# Patient Record
Sex: Female | Born: 1945 | Race: Black or African American | Hispanic: No | Smoking: Former smoker
Health system: Southern US, Community
[De-identification: ages and names within clinical notes are randomized; demographics above are authoritative.]

## PROBLEM LIST (undated history)

## (undated) DIAGNOSIS — Z8673 Personal history of transient ischemic attack (TIA), and cerebral infarction without residual deficits: Secondary | ICD-10-CM

## (undated) DIAGNOSIS — E785 Hyperlipidemia, unspecified: Secondary | ICD-10-CM

## (undated) DIAGNOSIS — I639 Cerebral infarction, unspecified: Secondary | ICD-10-CM

## (undated) DIAGNOSIS — I10 Essential (primary) hypertension: Secondary | ICD-10-CM

## (undated) HISTORY — PX: NO PAST SURGERIES: SHX2092

## (undated) HISTORY — DX: Hyperlipidemia, unspecified: E78.5

---

## 2003-07-25 ENCOUNTER — Inpatient Hospital Stay (HOSPITAL_COMMUNITY)
Admission: RE | Admit: 2003-07-25 | Discharge: 2003-08-08 | Payer: Self-pay | Admitting: Physical Medicine & Rehabilitation

## 2003-09-04 ENCOUNTER — Encounter
Admission: RE | Admit: 2003-09-04 | Discharge: 2003-12-03 | Payer: Self-pay | Admitting: Physical Medicine & Rehabilitation

## 2006-08-10 ENCOUNTER — Emergency Department (HOSPITAL_COMMUNITY): Admission: EM | Admit: 2006-08-10 | Discharge: 2006-08-10 | Payer: Self-pay | Admitting: Emergency Medicine

## 2008-02-16 ENCOUNTER — Emergency Department (HOSPITAL_COMMUNITY): Admission: EM | Admit: 2008-02-16 | Discharge: 2008-02-16 | Payer: Self-pay | Admitting: Emergency Medicine

## 2008-10-01 ENCOUNTER — Encounter: Admission: RE | Admit: 2008-10-01 | Discharge: 2008-10-01 | Payer: Self-pay | Admitting: Internal Medicine

## 2009-01-31 ENCOUNTER — Encounter (INDEPENDENT_AMBULATORY_CARE_PROVIDER_SITE_OTHER): Payer: Self-pay | Admitting: Obstetrics and Gynecology

## 2009-01-31 ENCOUNTER — Ambulatory Visit (HOSPITAL_COMMUNITY): Admission: RE | Admit: 2009-01-31 | Discharge: 2009-01-31 | Payer: Self-pay | Admitting: Obstetrics and Gynecology

## 2009-02-18 ENCOUNTER — Inpatient Hospital Stay (HOSPITAL_COMMUNITY): Admission: EM | Admit: 2009-02-18 | Discharge: 2009-03-19 | Payer: Self-pay | Admitting: Emergency Medicine

## 2009-03-12 ENCOUNTER — Ambulatory Visit: Payer: Self-pay | Admitting: Physical Medicine & Rehabilitation

## 2009-03-19 ENCOUNTER — Inpatient Hospital Stay (HOSPITAL_COMMUNITY)
Admission: RE | Admit: 2009-03-19 | Discharge: 2009-04-02 | Payer: Self-pay | Admitting: Physical Medicine & Rehabilitation

## 2009-03-19 ENCOUNTER — Ambulatory Visit: Payer: Self-pay | Admitting: Physical Medicine & Rehabilitation

## 2009-05-01 ENCOUNTER — Encounter
Admission: RE | Admit: 2009-05-01 | Discharge: 2009-05-01 | Payer: Self-pay | Admitting: Physical Medicine & Rehabilitation

## 2009-05-02 ENCOUNTER — Encounter: Admission: RE | Admit: 2009-05-02 | Discharge: 2009-07-31 | Payer: Self-pay | Admitting: Neurosurgery

## 2009-05-04 ENCOUNTER — Emergency Department (HOSPITAL_COMMUNITY): Admission: EM | Admit: 2009-05-04 | Discharge: 2009-05-04 | Payer: Self-pay | Admitting: Emergency Medicine

## 2009-05-16 ENCOUNTER — Inpatient Hospital Stay (HOSPITAL_COMMUNITY): Admission: EM | Admit: 2009-05-16 | Discharge: 2009-05-17 | Payer: Self-pay | Admitting: Emergency Medicine

## 2009-05-27 ENCOUNTER — Encounter: Admission: RE | Admit: 2009-05-27 | Discharge: 2009-05-27 | Payer: Self-pay | Admitting: Neurosurgery

## 2009-06-04 ENCOUNTER — Encounter
Admission: RE | Admit: 2009-06-04 | Discharge: 2009-06-04 | Payer: Self-pay | Admitting: Physical Medicine & Rehabilitation

## 2009-07-24 ENCOUNTER — Inpatient Hospital Stay (HOSPITAL_COMMUNITY): Admission: EM | Admit: 2009-07-24 | Discharge: 2009-07-26 | Payer: Self-pay | Admitting: Emergency Medicine

## 2009-07-24 ENCOUNTER — Ambulatory Visit: Payer: Self-pay | Admitting: Internal Medicine

## 2009-07-25 ENCOUNTER — Encounter (INDEPENDENT_AMBULATORY_CARE_PROVIDER_SITE_OTHER): Payer: Self-pay | Admitting: Emergency Medicine

## 2010-12-23 ENCOUNTER — Inpatient Hospital Stay (HOSPITAL_COMMUNITY)
Admission: EM | Admit: 2010-12-23 | Discharge: 2010-12-26 | DRG: 065 | Disposition: A | Discharge status: Home Health | Payer: MEDICARE | Attending: Internal Medicine | Admitting: Internal Medicine

## 2010-12-23 DIAGNOSIS — Z7902 Long term (current) use of antithrombotics/antiplatelets: Secondary | ICD-10-CM

## 2010-12-23 DIAGNOSIS — F17200 Nicotine dependence, unspecified, uncomplicated: Secondary | ICD-10-CM | POA: Diagnosis present

## 2010-12-23 DIAGNOSIS — A5900 Urogenital trichomoniasis, unspecified: Secondary | ICD-10-CM | POA: Diagnosis present

## 2010-12-23 DIAGNOSIS — I10 Essential (primary) hypertension: Secondary | ICD-10-CM | POA: Diagnosis present

## 2010-12-23 DIAGNOSIS — I679 Cerebrovascular disease, unspecified: Secondary | ICD-10-CM | POA: Diagnosis present

## 2010-12-23 DIAGNOSIS — R471 Dysarthria and anarthria: Secondary | ICD-10-CM | POA: Diagnosis present

## 2010-12-23 DIAGNOSIS — N179 Acute kidney failure, unspecified: Secondary | ICD-10-CM | POA: Diagnosis present

## 2010-12-23 DIAGNOSIS — R7402 Elevation of levels of lactic acid dehydrogenase (LDH): Secondary | ICD-10-CM | POA: Diagnosis present

## 2010-12-23 DIAGNOSIS — E876 Hypokalemia: Secondary | ICD-10-CM | POA: Diagnosis present

## 2010-12-23 DIAGNOSIS — E860 Dehydration: Secondary | ICD-10-CM | POA: Diagnosis present

## 2010-12-23 DIAGNOSIS — I6350 Cerebral infarction due to unspecified occlusion or stenosis of unspecified cerebral artery: Principal | ICD-10-CM | POA: Diagnosis present

## 2010-12-23 DIAGNOSIS — G319 Degenerative disease of nervous system, unspecified: Secondary | ICD-10-CM | POA: Diagnosis present

## 2010-12-23 DIAGNOSIS — Z880 Allergy status to penicillin: Secondary | ICD-10-CM

## 2010-12-23 DIAGNOSIS — K219 Gastro-esophageal reflux disease without esophagitis: Secondary | ICD-10-CM | POA: Diagnosis present

## 2010-12-23 DIAGNOSIS — R7401 Elevation of levels of liver transaminase levels: Secondary | ICD-10-CM | POA: Diagnosis present

## 2010-12-23 DIAGNOSIS — E669 Obesity, unspecified: Secondary | ICD-10-CM | POA: Diagnosis present

## 2010-12-23 DIAGNOSIS — Z7982 Long term (current) use of aspirin: Secondary | ICD-10-CM

## 2010-12-23 DIAGNOSIS — E119 Type 2 diabetes mellitus without complications: Secondary | ICD-10-CM | POA: Diagnosis present

## 2010-12-23 DIAGNOSIS — Z87820 Personal history of traumatic brain injury: Secondary | ICD-10-CM

## 2010-12-23 DIAGNOSIS — F079 Unspecified personality and behavioral disorder due to known physiological condition: Secondary | ICD-10-CM | POA: Diagnosis present

## 2010-12-23 LAB — CBC
MCH: 28 pg (ref 26.0–34.0)
MCHC: 33.3 g/dL (ref 30.0–36.0)
RDW: 14.5 % (ref 11.5–15.5)

## 2010-12-23 LAB — DIFFERENTIAL
Basophils Absolute: 0 10*3/uL (ref 0.0–0.1)
Basophils Relative: 0 % (ref 0–1)
Eosinophils Absolute: 0.1 10*3/uL (ref 0.0–0.7)
Eosinophils Relative: 1 % (ref 0–5)
Lymphocytes Relative: 9 % — ABNORMAL LOW (ref 12–46)
Monocytes Relative: 5 % (ref 3–12)
Neutrophils Relative %: 85 % — ABNORMAL HIGH (ref 43–77)

## 2010-12-23 LAB — COMPREHENSIVE METABOLIC PANEL
ALT: 124 U/L — ABNORMAL HIGH (ref 0–35)
Albumin: 2.7 g/dL — ABNORMAL LOW (ref 3.5–5.2)
BUN: 17 mg/dL (ref 6–23)
Calcium: 8.8 mg/dL (ref 8.4–10.5)
Glucose, Bld: 286 mg/dL — ABNORMAL HIGH (ref 70–99)
Total Bilirubin: 2.1 mg/dL — ABNORMAL HIGH (ref 0.3–1.2)
Total Protein: 6.4 g/dL (ref 6.0–8.3)

## 2010-12-24 ENCOUNTER — Emergency Department (HOSPITAL_COMMUNITY): Payer: MEDICARE

## 2010-12-24 DIAGNOSIS — R55 Syncope and collapse: Secondary | ICD-10-CM

## 2010-12-24 DIAGNOSIS — M79609 Pain in unspecified limb: Secondary | ICD-10-CM

## 2010-12-24 LAB — URINE MICROSCOPIC-ADD ON

## 2010-12-24 LAB — CBC
HCT: 35.8 % — ABNORMAL LOW (ref 36.0–46.0)
MCHC: 33 g/dL (ref 30.0–36.0)
MCV: 82.7 fL (ref 78.0–100.0)
Platelets: 158 10*3/uL (ref 150–400)
RDW: 14.4 % (ref 11.5–15.5)
WBC: 7.7 10*3/uL (ref 4.0–10.5)

## 2010-12-24 LAB — DIFFERENTIAL
Eosinophils Absolute: 0.1 10*3/uL (ref 0.0–0.7)
Eosinophils Relative: 2 % (ref 0–5)
Lymphocytes Relative: 12 % (ref 12–46)
Lymphs Abs: 0.9 10*3/uL (ref 0.7–4.0)
Monocytes Absolute: 0.6 10*3/uL (ref 0.1–1.0)

## 2010-12-24 LAB — GLUCOSE, CAPILLARY
Glucose-Capillary: 205 mg/dL — ABNORMAL HIGH (ref 70–99)
Glucose-Capillary: 238 mg/dL — ABNORMAL HIGH (ref 70–99)

## 2010-12-24 LAB — HEMOGLOBIN A1C
Hgb A1c MFr Bld: 7 % — ABNORMAL HIGH (ref <5.7)
Mean Plasma Glucose: 154 mg/dL — ABNORMAL HIGH (ref <117)

## 2010-12-24 LAB — URINALYSIS, ROUTINE W REFLEX MICROSCOPIC
Hgb urine dipstick: NEGATIVE
Nitrite: POSITIVE — AB
Specific Gravity, Urine: 1.029 (ref 1.005–1.030)
Urobilinogen, UA: 4 mg/dL — ABNORMAL HIGH (ref 0.0–1.0)
pH: 6 (ref 5.0–8.0)

## 2010-12-24 LAB — COMPREHENSIVE METABOLIC PANEL
Albumin: 2.5 g/dL — ABNORMAL LOW (ref 3.5–5.2)
BUN: 18 mg/dL (ref 6–23)
Calcium: 8.7 mg/dL (ref 8.4–10.5)
Creatinine, Ser: 1.72 mg/dL — ABNORMAL HIGH (ref 0.4–1.2)
Glucose, Bld: 263 mg/dL — ABNORMAL HIGH (ref 70–99)
Potassium: 3.8 mEq/L (ref 3.5–5.1)
Total Protein: 5.7 g/dL — ABNORMAL LOW (ref 6.0–8.3)

## 2010-12-24 LAB — T3: T3, Total: 69.4 ng/dl — ABNORMAL LOW (ref 80.0–204.0)

## 2010-12-24 LAB — TROPONIN I
Troponin I: 0.18 ng/mL — ABNORMAL HIGH (ref 0.00–0.06)
Troponin I: 0.18 ng/mL — ABNORMAL HIGH (ref 0.00–0.06)

## 2010-12-24 LAB — CK TOTAL AND CKMB (NOT AT ARMC)
CK, MB: 2.1 ng/mL (ref 0.3–4.0)
Relative Index: 0.5 (ref 0.0–2.5)
Total CK: 438 U/L — ABNORMAL HIGH (ref 7–177)

## 2010-12-24 LAB — LIPID PANEL
HDL: 26 mg/dL — ABNORMAL LOW (ref >39)
LDL Cholesterol: 86 mg/dL (ref 0–99)
Total CHOL/HDL Ratio: 6.2 RATIO
Triglycerides: 249 mg/dL — ABNORMAL HIGH (ref <150)
VLDL: 50 mg/dL — ABNORMAL HIGH (ref 0–40)

## 2010-12-24 LAB — CARDIAC PANEL(CRET KIN+CKTOT+MB+TROPI)
CK, MB: 2.1 ng/mL (ref 0.3–4.0)
Total CK: 244 U/L — ABNORMAL HIGH (ref 7–177)

## 2010-12-25 ENCOUNTER — Inpatient Hospital Stay (HOSPITAL_COMMUNITY): Payer: MEDICARE

## 2010-12-25 LAB — DIFFERENTIAL
Eosinophils Absolute: 0.3 10*3/uL (ref 0.0–0.7)
Lymphs Abs: 1.9 10*3/uL (ref 0.7–4.0)
Monocytes Absolute: 0.6 10*3/uL (ref 0.1–1.0)
Monocytes Relative: 9 % (ref 3–12)
Neutro Abs: 4 10*3/uL (ref 1.7–7.7)
Neutrophils Relative %: 59 % (ref 43–77)

## 2010-12-25 LAB — URINE CULTURE

## 2010-12-25 LAB — COMPREHENSIVE METABOLIC PANEL
BUN: 19 mg/dL (ref 6–23)
CO2: 22 mEq/L (ref 19–32)
Chloride: 108 mEq/L (ref 96–112)
Creatinine, Ser: 1.57 mg/dL — ABNORMAL HIGH (ref 0.4–1.2)
GFR calc non Af Amer: 33 mL/min — ABNORMAL LOW (ref >60)
Total Bilirubin: 0.3 mg/dL (ref 0.3–1.2)

## 2010-12-25 LAB — HEPATITIS PANEL, ACUTE
HCV Ab: NEGATIVE
Hep A IgM: NEGATIVE
Hep B C IgM: NEGATIVE
Hepatitis B Surface Ag: NEGATIVE

## 2010-12-25 LAB — CBC
Hemoglobin: 10.8 g/dL — ABNORMAL LOW (ref 12.0–15.0)
MCH: 26.6 pg (ref 26.0–34.0)
MCHC: 31.7 g/dL (ref 30.0–36.0)
MCV: 84 fL (ref 78.0–100.0)
RBC: 4.06 MIL/uL (ref 3.87–5.11)

## 2010-12-25 LAB — GLUCOSE, CAPILLARY
Glucose-Capillary: 210 mg/dL — ABNORMAL HIGH (ref 70–99)
Glucose-Capillary: 163 mg/dL — ABNORMAL HIGH (ref 70–99)

## 2010-12-26 ENCOUNTER — Inpatient Hospital Stay (HOSPITAL_COMMUNITY): Payer: MEDICARE

## 2010-12-26 LAB — BASIC METABOLIC PANEL
BUN: 16 mg/dL (ref 6–23)
CO2: 23 mEq/L (ref 19–32)
Calcium: 8.1 mg/dL — ABNORMAL LOW (ref 8.4–10.5)
Creatinine, Ser: 1.3 mg/dL — ABNORMAL HIGH (ref 0.4–1.2)
GFR calc Af Amer: 50 mL/min — ABNORMAL LOW (ref >60)

## 2010-12-26 LAB — GLUCOSE, CAPILLARY
Glucose-Capillary: 163 mg/dL — ABNORMAL HIGH (ref 70–99)
Glucose-Capillary: 253 mg/dL — ABNORMAL HIGH (ref 70–99)

## 2010-12-26 NOTE — H&P (Signed)
[name], [name]           ACCOUNT NO.:  [id number]  MEDICAL RECORD NO.:  [id number]           PATIENT TYPE:  E  LOCATION:  [facility]                         FACILITY:  [facility]  PHYSICIAN:  Talmage Nap, MD  DATE OF BIRTH:  18-Dec-1945  DATE OF ADMISSION:  12/23/2010 DATE OF DISCHARGE:                             HISTORY & PHYSICAL   History obtainable from the patient (not a very good historian).  CHIEF COMPLAINT:  "Passed out about 3 times".  HISTORY OF PRESENT ILLNESS:  The patient is a 65 year old African American female, obese with prior history of CVA, diabetic, not a very good historian, presenting to the emergency room with history of having passed out on 3 occasions prior to this admission.  She denied any premonitory symptoms prior to personal.  She denied any history of chest pain.  No shortness of breath.  He denied any history of fever.  No chills.  No rigor.  The patient could not specify for how long she passed out.  She also denied any history of weakness or slurred speech. Because the patient has passed out on 3 occasions, she was subsequently brought to the emergency room by her daughter for evaluation.  Past medical history is positive for diabetes mellitus, dementia, cognitive impairment, history of subdural hematoma, multiple CVA with bilateral hemiparesis, hypertension and GERD.  PAST SURGICAL HISTORY:  The patient cannot recall any surgeries.  MEDICATIONS: Her preadmission meds unknown.  ALLERGIES:  She has allergies to PENICILLIN.  SOCIAL HISTORY:  The patient smokes about a pack of cigarettes per day for over 20 years.  Denies any history of alcohol or street drug use and she lives with her daughter.  FAMILY HISTORY:  York Spaniel to be positive for hypertension.  REVIEW OF SYSTEMS:  The patient denies any history of headaches.  No blurred vision, nausea, vomiting.  No fever. No chills.  No rigor.  No chest pain or shortness of breath.  Occasional  cough, abdominal discomfort.  No diarrhea or hematochezia.  No dysuria or hematuria.  No swelling of the lower extremities.  No intolerance to heat or cold and no known psychiatric disorder.  PHYSICAL EXAMINATION:  GENERAL:  On examination, elderly lady not in any obvious respiratory distress with hirsutism, suboptimal hydration. VITAL SIGNS:  Blood pressure is 93/43, pulse 96, respiratory rate is 20, temperature is 99.5. HEENT:  Pupils are reactive to light and extraocular muscles are intact. NECK:  No jugular venous distention.  No carotid bruit.  No lymphadenopathy. CHEST:  Showed minimal scattered rhonchi. HEART:  Heart sounds are 1 and 2. ABDOMEN:  Obese, nontender.  Liver, spleen could not palpable.  Bowel sounds are positive. EXTREMITIES:  No pedal edema. NEUROLOGIC:  Did not show any lateralizing signs. NEUROPSYCHIATRIC:  Evaluation showed the patient to be slightly forgetful. MUSCULOSKELETAL:  Unremarkable. SKIN:  Showed decreased turgor.  LABORATORY DATA:  Initial complete blood count with differential showed WBC of 9.3, hemoglobin of 13.0, hematocrit of 39.0, MCV of 83.9 with a platelet count of 156, neutrophil of 85%.  Comprehensive metabolic panel showed sodium of 136, potassium of 4.3, chloride of 102 with a bicarb of 24,  glucose is 286, BUN is 17, creatinine is 1.63.  LFTs showed total bilirubin 2.1, alkaline phosphatase is 137, AST 120, ALT 124, total protein is 6.4 and albumin is 2.7.  Urinalysis showed positive nitrite with moderate leukocyte esterase.  Urine microscopy showed wbc's 21-50, bacteria many with Trichomonas present and a repeat complete blood count with differential done on December 24, 2010 showed WBC of 7.7, hemoglobin of 11.8, hematocrit of 35.8, MCV 80.7, and platelet count of 158. Comprehensive metabolic panel shows sodium of 141, potassium of 3.8, chloride 106 with a bicarb of 25, glucose is 263, BUN is 18, creatinine is 1.72.  LFT total  bilirubin is 1.3, alkaline phosphatase 129, AST 95, ALT 115.  First set of cardiac enzyme troponin-I 0.18.  Imaging studies done on the patient include CT of the head without contrast which showed old left frontal and left basilar ganglia impacted atrophy and chronic ischemic white matter changes.  EKG showed a normal sinus rhythm, LVH with a rate of 88.  No acute ST-wave change noted.  IMPRESSION: 1. Syncope. 2. Obesity. 3. Diabetes mellitus. 4. Prior history of cerebrovascular accident. 5. History of subdural hematoma. 6. Urinary tract infection (incidentaloma/trichomoniasis). 7. History of hypertension (BP now borderline). 8. Cognitive impairment. 9. Chronic obstructive pulmonary disease. 10.Coronary insufficiency. 11.Abnormal LFT questionable etiology.  PLAN:  Plan is to admit the patient to telemetry.  The patient will be given aspirin 81 mg p.o. daily.  She will be adequately rehydrated with half-normal saline IV to go at rate of 6 mL an hour and Lipitor 20 mg p.o. daily.  She will placed on Accu-Cheks t.i.d. with a.c. and bedtime with regular insulin sliding scale (moderate scale).  She will be treated for UTI with Avelox 100 mg p.o. daily and trichomoniases with Flagyl 500 mg p.o. t.i.d.  She also be on Combivent inhaler 2 puffs q.i.d. p.r.n. for questionable COPD.  She will be given Protonix 40 mg p.o. daily for GI prophylaxis and Lovenox 40 mg subcu q. 24 for DVT prophylaxis.  Further lab work to be ordered on this patient will include cardiac enzymes q. 6h x3, a 2-D echo, carotid duplex, hepatitis panel A, B and C, thyroid panel which include TSH, T3 and T4, hemoglobin A1c and lipid panel.  The patient will also have EEG done to rule out seizure.  She will be followed and evaluated on a daily basis.     Talmage Nap, MD     CN/MEDQ  D:  12/24/2010  T:  12/24/2010  Job:  [id number]  Electronically Signed by Talmage Nap  on 12/24/2010 06:09:53 AM

## 2011-01-05 NOTE — Discharge Summary (Signed)
NAMESECILIA, APPS           ACCOUNT NO.:  [id number]  MEDICAL RECORD NO.:  [id number]           PATIENT TYPE:  I  LOCATION:  2501                         FACILITY:  [facility]  PHYSICIAN:  Lonia Blood, M.D.       DATE OF BIRTH:  August 06, 1946  DATE OF ADMISSION:  12/23/2010 DATE OF DISCHARGE:  12/26/2010                              DISCHARGE SUMMARY   PRIMARY CARE PHYSICIAN:  Fleet Contras, MD  DISCHARGE DIAGNOSES: 1. Delirium, resolved. 2. Acute renal failure, resolved. 3. Elevated transaminases with negative for acute hepatitis panel -     needs outpatient followup, etiology could be dehydration, but it     also could be a primary liver disease, complete workup has not been     done in the hospital. 4. Diabetes mellitus type 2. 5. Subacute cerebellar stroke, probably the source of the vertigo and     nausea that precipitated poor p.o. intake and dehydration and acute     renal failure. 6. History of traumatic brain injury with subdural hematomas. 7. 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 8. 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 10.Hypertension. 11.Gastroesophageal reflux disease. 12.Urinary tract infection. 13.Tobacco abuse.  DISCHARGE MEDICATIONS: 1. Aspirin 81 mg daily. 2. Ceftin 250 mg twice a day. 3. Metformin 500 mg twice a day. 4. Nicotine patch 21 mg daily. 5. Omeprazole 20 mg daily. 6. Plavix 75 mg daily.  CONDITION ON DISCHARGE:  Ms. Wedemeyer was discharged in fair condition. Home Health Physical Therapy, Occupation Therapy, and nursing was signed.  The patient will follow up with Dr. Concepcion Elk in the office.  She was told to stop smoking cigarettes, participated in exercises and be compliant with her medications.  PROCEDURE DURING THIS ADMISSION: 1. On December 24, 2010, the patient underwent a head CT, which was     negative for acute  infarcts, old left frontal and left basal     ganglia infarcts, atrophy. 2. On December 25, 2010, EEG, which was negative for seizures. 3. On December 25, 2010, MRI of the brain, findings of a subacute     infarct in the right cerebellum, age undetermined probably within     couple of weeks, multiple old infarcts, atrophy, resolution of     previously seen subdural hematomas.  CONSULTATION DURING ADMISSION:  No consultations obtained.  HISTORY AND PHYSICAL:  Refer to the dictated H and P done by Dr. Beverly Gust.  HOSPITAL COURSE: 1. Ms. Finks is a 65 year old woman with history of strokes,     diabetes, organic brain syndrome, was brought to the emergency room     with confusion, delirium and dehydration.  She had blood cultures     and urine cultures sent out and urine culture even though showed no     growth.  The patient's symptoms were consistent of possible urinary     tract infection.  She was treated with intravenous Rocephin, which     was later switched to oral cefuroxime.  The patient received     intravenous fluids with  correction of her dehydration, also with     intravenous fluids, the patient's LFTs improved.  Initially, the     total bilirubin was 2.1, followup bilirubin was 0.3, same happened     with alkaline phosphatase, AST and ALT.  We think that the abnormal     LFTs were due to the urinary tract infection and dehydration,     hypovolemia.  The patient had acute hepatitis panel, which was     negative for hepatitis B, A, and C.  I would strongly recommend in     the outpatient setting the LFTs would be monitored to assure that     they improved to normality.  If not, the patient should be     evaluated with abdominal ultrasound and further liver serology for     autoimmune hepatitis, primary biliary cirrhosis, and     hemochromatosis. 2. Diabetes mellitus type 2.  Hemoglobin A1c was measured at 7.0.  The     patient was started on metformin twice a day and she  will need an     outpatient followup. 3. Subacute cerebellar stroke.  This is part of the syndrome of small     vessel disease due to tobacco abuse and alcohol.  The patient was     told to stop smoking.  She was placed on aspirin and Plavix.  She     was added by Physical Therapy and Outpatient Therapy and felt that     she is a candidate for home health PT, OT with 24-hour care by her     family. 4. Mildly elevated troponins without any chest pain.  This was     probably part of the same syndrome as the dehydration, abnormal     LFTs of generalized SIRS.  The patient had an echocardiogram that     showed normal ejection fraction with 55-60% left ventricular     hypertrophy and no significant valvular abnormalities.  Even though     the echocardiogram said that she could have the mitral valve as a     source for the embolus, her clinical picture was not one of an     endocarditis.  She was continuously afebrile with stable vital     signs.  She never had any chest pain, also supposedly acute stroke     we were investigating for is a cerebellar small-vessel disease     stroke, that was subacute in nature.  She has to pass a lot of     other hurdles before embarking on transesophageal echocardiogram     and potential anticoagulation.  Those hurdles will be smoking     cessation, compliance with aspirin and Plavix and another risk     factor modification like diabetes control.  The patient was     discharged home on December 26, 2010 and to follow up with her     primary care physician Dr. Concepcion Elk.  Please note that as part of     the full evaluation of this patient, she also underwent lower     extremity deep venous Dopplers, which were negative for deep venous     thrombosis and carotid ultrasound, which was negative for any     carotid artery stenosis.     Lonia Blood, M.D.     SL/MEDQ  D:  12/28/2010  T:  12/28/2010  Job:  [id number]  cc:   Fleet Contras, M.D.  Electronically  Signed by Lonia Blood  M.D. on 12/29/2010 07:01:44 PM

## 2011-02-26 LAB — COMPREHENSIVE METABOLIC PANEL
ALT: 18 U/L (ref 0–35)
AST: 21 U/L (ref 0–37)
Albumin: 3.7 g/dL (ref 3.5–5.2)
Alkaline Phosphatase: 73 U/L (ref 39–117)
Calcium: 9.2 mg/dL (ref 8.4–10.5)
GFR calc Af Amer: >60 mL/min (ref >60)
Glucose, Bld: 89 mg/dL (ref 70–99)
Potassium: 4.6 mEq/L (ref 3.5–5.1)
Sodium: 142 mEq/L (ref 135–145)
Total Protein: 6.6 g/dL (ref 6.0–8.3)

## 2011-02-26 LAB — TROPONIN I
Troponin I: 0.06 ng/mL (ref 0.00–0.06)
Troponin I: 0.07 ng/mL — ABNORMAL HIGH (ref 0.00–0.06)

## 2011-02-26 LAB — GLUCOSE, CAPILLARY
Glucose-Capillary: 109 mg/dL — ABNORMAL HIGH (ref 70–99)
Glucose-Capillary: 129 mg/dL — ABNORMAL HIGH (ref 70–99)
Glucose-Capillary: 70 mg/dL (ref 70–99)
Glucose-Capillary: 88 mg/dL (ref 70–99)

## 2011-02-26 LAB — CK TOTAL AND CKMB (NOT AT ARMC)
Relative Index: 1.3 (ref 0.0–2.5)
Total CK: 167 U/L (ref 7–177)
Total CK: 187 U/L — ABNORMAL HIGH (ref 7–177)

## 2011-02-26 LAB — CARDIAC PANEL(CRET KIN+CKTOT+MB+TROPI)
CK, MB: 2.2 ng/mL (ref 0.3–4.0)
CK, MB: 2.8 ng/mL (ref 0.3–4.0)
Relative Index: 1.4 (ref 0.0–2.5)
Total CK: 192 U/L — ABNORMAL HIGH (ref 7–177)
Total CK: 196 U/L — ABNORMAL HIGH (ref 7–177)
Troponin I: 0.08 ng/mL — ABNORMAL HIGH (ref 0.00–0.06)

## 2011-02-26 LAB — CBC
Hemoglobin: 12.9 g/dL (ref 12.0–15.0)
Hemoglobin: 13.9 g/dL (ref 12.0–15.0)
MCHC: 33.1 g/dL (ref 30.0–36.0)
MCHC: 33.6 g/dL (ref 30.0–36.0)
MCV: 83.8 fL (ref 78.0–100.0)
MCV: 84 fL (ref 78.0–100.0)
RBC: 4.57 MIL/uL (ref 3.87–5.11)
RDW: 14.7 % (ref 11.5–15.5)
RDW: 14.7 % (ref 11.5–15.5)

## 2011-02-26 LAB — URINALYSIS, ROUTINE W REFLEX MICROSCOPIC
Glucose, UA: NEGATIVE mg/dL
Hgb urine dipstick: NEGATIVE
Protein, ur: NEGATIVE mg/dL
Specific Gravity, Urine: 1.011 (ref 1.005–1.030)

## 2011-02-26 LAB — URINE CULTURE

## 2011-02-26 LAB — URINE MICROSCOPIC-ADD ON

## 2011-02-26 LAB — VITAMIN B12: Vitamin B-12: 256 pg/mL (ref 211–911)

## 2011-02-26 LAB — PROTIME-INR: INR: 1 (ref 0.00–1.49)

## 2011-02-26 LAB — POCT I-STAT, CHEM 8
BUN: 10 mg/dL (ref 6–23)
Chloride: 109 mEq/L (ref 96–112)
Creatinine, Ser: 1 mg/dL (ref 0.4–1.2)
Potassium: 4.3 mEq/L (ref 3.5–5.1)
Sodium: 139 mEq/L (ref 135–145)

## 2011-03-01 LAB — DIFFERENTIAL
Basophils Absolute: 0.1 10*3/uL (ref 0.0–0.1)
Basophils Relative: 1 % (ref 0–1)
Eosinophils Relative: 2 % (ref 0–5)
Monocytes Absolute: 0.4 10*3/uL (ref 0.1–1.0)
Neutro Abs: 4.7 10*3/uL (ref 1.7–7.7)

## 2011-03-01 LAB — CBC
HCT: 43.4 % (ref 36.0–46.0)
Hemoglobin: 14.5 g/dL (ref 12.0–15.0)
Platelets: 215 10*3/uL (ref 150–400)
RBC: 5.11 MIL/uL (ref 3.87–5.11)
WBC: 8.4 10*3/uL (ref 4.0–10.5)

## 2011-03-01 LAB — COMPREHENSIVE METABOLIC PANEL
Albumin: 4.2 g/dL (ref 3.5–5.2)
Alkaline Phosphatase: 70 U/L (ref 39–117)
BUN: 12 mg/dL (ref 6–23)
CO2: 28 mEq/L (ref 19–32)
Chloride: 100 mEq/L (ref 96–112)
GFR calc non Af Amer: 56 mL/min — ABNORMAL LOW (ref >60)
Potassium: 3.6 mEq/L (ref 3.5–5.1)
Total Bilirubin: 0.7 mg/dL (ref 0.3–1.2)

## 2011-03-01 LAB — POCT I-STAT, CHEM 8
BUN: 11 mg/dL (ref 6–23)
Chloride: 106 mEq/L (ref 96–112)
Sodium: 141 mEq/L (ref 135–145)

## 2011-03-01 LAB — GLUCOSE, CAPILLARY
Glucose-Capillary: 109 mg/dL — ABNORMAL HIGH (ref 70–99)
Glucose-Capillary: 135 mg/dL — ABNORMAL HIGH (ref 70–99)

## 2011-03-02 LAB — GLUCOSE, CAPILLARY
Glucose-Capillary: 112 mg/dL — ABNORMAL HIGH (ref 70–99)
Glucose-Capillary: 113 mg/dL — ABNORMAL HIGH (ref 70–99)
Glucose-Capillary: 116 mg/dL — ABNORMAL HIGH (ref 70–99)
Glucose-Capillary: 118 mg/dL — ABNORMAL HIGH (ref 70–99)
Glucose-Capillary: 129 mg/dL — ABNORMAL HIGH (ref 70–99)
Glucose-Capillary: 130 mg/dL — ABNORMAL HIGH (ref 70–99)
Glucose-Capillary: 130 mg/dL — ABNORMAL HIGH (ref 70–99)
Glucose-Capillary: 130 mg/dL — ABNORMAL HIGH (ref 70–99)
Glucose-Capillary: 134 mg/dL — ABNORMAL HIGH (ref 70–99)
Glucose-Capillary: 141 mg/dL — ABNORMAL HIGH (ref 70–99)
Glucose-Capillary: 145 mg/dL — ABNORMAL HIGH (ref 70–99)
Glucose-Capillary: 150 mg/dL — ABNORMAL HIGH (ref 70–99)
Glucose-Capillary: 155 mg/dL — ABNORMAL HIGH (ref 70–99)
Glucose-Capillary: 190 mg/dL — ABNORMAL HIGH (ref 70–99)
Glucose-Capillary: 49 mg/dL — ABNORMAL LOW (ref 70–99)
Glucose-Capillary: 52 mg/dL — ABNORMAL LOW (ref 70–99)
Glucose-Capillary: 57 mg/dL — ABNORMAL LOW (ref 70–99)
Glucose-Capillary: 62 mg/dL — ABNORMAL LOW (ref 70–99)
Glucose-Capillary: 64 mg/dL — ABNORMAL LOW (ref 70–99)
Glucose-Capillary: 73 mg/dL (ref 70–99)
Glucose-Capillary: 76 mg/dL (ref 70–99)
Glucose-Capillary: 77 mg/dL (ref 70–99)
Glucose-Capillary: 80 mg/dL (ref 70–99)
Glucose-Capillary: 88 mg/dL (ref 70–99)
Glucose-Capillary: 93 mg/dL (ref 70–99)
Glucose-Capillary: 94 mg/dL (ref 70–99)

## 2011-03-02 LAB — URINE CULTURE: Colony Count: 7000

## 2011-03-02 LAB — URINE MICROSCOPIC-ADD ON

## 2011-03-02 LAB — CBC
HCT: 39.1 % (ref 36.0–46.0)
Hemoglobin: 13.1 g/dL (ref 12.0–15.0)
RBC: 4.59 MIL/uL (ref 3.87–5.11)
WBC: 8.5 10*3/uL (ref 4.0–10.5)

## 2011-03-02 LAB — URINALYSIS, ROUTINE W REFLEX MICROSCOPIC
Bilirubin Urine: NEGATIVE
Hgb urine dipstick: NEGATIVE
Ketones, ur: NEGATIVE mg/dL
Protein, ur: NEGATIVE mg/dL
Urobilinogen, UA: 1 mg/dL (ref 0.0–1.0)

## 2011-03-02 LAB — BASIC METABOLIC PANEL
GFR calc non Af Amer: >60 mL/min (ref >60)
Glucose, Bld: 119 mg/dL — ABNORMAL HIGH (ref 70–99)
Potassium: 4 mEq/L (ref 3.5–5.1)
Sodium: 140 mEq/L (ref 135–145)

## 2011-03-03 LAB — GLUCOSE, CAPILLARY
Glucose-Capillary: 100 mg/dL — ABNORMAL HIGH (ref 70–99)
Glucose-Capillary: 105 mg/dL — ABNORMAL HIGH (ref 70–99)
Glucose-Capillary: 107 mg/dL — ABNORMAL HIGH (ref 70–99)
Glucose-Capillary: 109 mg/dL — ABNORMAL HIGH (ref 70–99)
Glucose-Capillary: 110 mg/dL — ABNORMAL HIGH (ref 70–99)
Glucose-Capillary: 113 mg/dL — ABNORMAL HIGH (ref 70–99)
Glucose-Capillary: 116 mg/dL — ABNORMAL HIGH (ref 70–99)
Glucose-Capillary: 117 mg/dL — ABNORMAL HIGH (ref 70–99)
Glucose-Capillary: 117 mg/dL — ABNORMAL HIGH (ref 70–99)
Glucose-Capillary: 120 mg/dL — ABNORMAL HIGH (ref 70–99)
Glucose-Capillary: 121 mg/dL — ABNORMAL HIGH (ref 70–99)
Glucose-Capillary: 121 mg/dL — ABNORMAL HIGH (ref 70–99)
Glucose-Capillary: 122 mg/dL — ABNORMAL HIGH (ref 70–99)
Glucose-Capillary: 135 mg/dL — ABNORMAL HIGH (ref 70–99)
Glucose-Capillary: 136 mg/dL — ABNORMAL HIGH (ref 70–99)
Glucose-Capillary: 136 mg/dL — ABNORMAL HIGH (ref 70–99)
Glucose-Capillary: 145 mg/dL — ABNORMAL HIGH (ref 70–99)
Glucose-Capillary: 148 mg/dL — ABNORMAL HIGH (ref 70–99)
Glucose-Capillary: 148 mg/dL — ABNORMAL HIGH (ref 70–99)
Glucose-Capillary: 156 mg/dL — ABNORMAL HIGH (ref 70–99)
Glucose-Capillary: 156 mg/dL — ABNORMAL HIGH (ref 70–99)
Glucose-Capillary: 159 mg/dL — ABNORMAL HIGH (ref 70–99)
Glucose-Capillary: 160 mg/dL — ABNORMAL HIGH (ref 70–99)
Glucose-Capillary: 59 mg/dL — ABNORMAL LOW (ref 70–99)
Glucose-Capillary: 65 mg/dL — ABNORMAL LOW (ref 70–99)
Glucose-Capillary: 67 mg/dL — ABNORMAL LOW (ref 70–99)
Glucose-Capillary: 70 mg/dL (ref 70–99)
Glucose-Capillary: 71 mg/dL (ref 70–99)
Glucose-Capillary: 77 mg/dL (ref 70–99)
Glucose-Capillary: 87 mg/dL (ref 70–99)
Glucose-Capillary: 87 mg/dL (ref 70–99)
Glucose-Capillary: 88 mg/dL (ref 70–99)
Glucose-Capillary: 95 mg/dL (ref 70–99)
Glucose-Capillary: 98 mg/dL (ref 70–99)
Glucose-Capillary: 100 mg/dL — ABNORMAL HIGH (ref 70–99)
Glucose-Capillary: 102 mg/dL — ABNORMAL HIGH (ref 70–99)
Glucose-Capillary: 103 mg/dL — ABNORMAL HIGH (ref 70–99)
Glucose-Capillary: 103 mg/dL — ABNORMAL HIGH (ref 70–99)
Glucose-Capillary: 106 mg/dL — ABNORMAL HIGH (ref 70–99)
Glucose-Capillary: 108 mg/dL — ABNORMAL HIGH (ref 70–99)
Glucose-Capillary: 108 mg/dL — ABNORMAL HIGH (ref 70–99)
Glucose-Capillary: 109 mg/dL — ABNORMAL HIGH (ref 70–99)
Glucose-Capillary: 110 mg/dL — ABNORMAL HIGH (ref 70–99)
Glucose-Capillary: 110 mg/dL — ABNORMAL HIGH (ref 70–99)
Glucose-Capillary: 114 mg/dL — ABNORMAL HIGH (ref 70–99)
Glucose-Capillary: 116 mg/dL — ABNORMAL HIGH (ref 70–99)
Glucose-Capillary: 123 mg/dL — ABNORMAL HIGH (ref 70–99)
Glucose-Capillary: 131 mg/dL — ABNORMAL HIGH (ref 70–99)
Glucose-Capillary: 132 mg/dL — ABNORMAL HIGH (ref 70–99)
Glucose-Capillary: 133 mg/dL — ABNORMAL HIGH (ref 70–99)
Glucose-Capillary: 138 mg/dL — ABNORMAL HIGH (ref 70–99)
Glucose-Capillary: 141 mg/dL — ABNORMAL HIGH (ref 70–99)
Glucose-Capillary: 142 mg/dL — ABNORMAL HIGH (ref 70–99)
Glucose-Capillary: 142 mg/dL — ABNORMAL HIGH (ref 70–99)
Glucose-Capillary: 145 mg/dL — ABNORMAL HIGH (ref 70–99)
Glucose-Capillary: 145 mg/dL — ABNORMAL HIGH (ref 70–99)
Glucose-Capillary: 161 mg/dL — ABNORMAL HIGH (ref 70–99)
Glucose-Capillary: 53 mg/dL — ABNORMAL LOW (ref 70–99)
Glucose-Capillary: 57 mg/dL — ABNORMAL LOW (ref 70–99)
Glucose-Capillary: 61 mg/dL — ABNORMAL LOW (ref 70–99)
Glucose-Capillary: 62 mg/dL — ABNORMAL LOW (ref 70–99)
Glucose-Capillary: 63 mg/dL — ABNORMAL LOW (ref 70–99)
Glucose-Capillary: 65 mg/dL — ABNORMAL LOW (ref 70–99)
Glucose-Capillary: 67 mg/dL — ABNORMAL LOW (ref 70–99)
Glucose-Capillary: 69 mg/dL — ABNORMAL LOW (ref 70–99)
Glucose-Capillary: 69 mg/dL — ABNORMAL LOW (ref 70–99)
Glucose-Capillary: 71 mg/dL (ref 70–99)
Glucose-Capillary: 72 mg/dL (ref 70–99)
Glucose-Capillary: 75 mg/dL (ref 70–99)
Glucose-Capillary: 76 mg/dL (ref 70–99)
Glucose-Capillary: 77 mg/dL (ref 70–99)
Glucose-Capillary: 78 mg/dL (ref 70–99)
Glucose-Capillary: 81 mg/dL (ref 70–99)
Glucose-Capillary: 89 mg/dL (ref 70–99)
Glucose-Capillary: 92 mg/dL (ref 70–99)
Glucose-Capillary: 96 mg/dL (ref 70–99)
Glucose-Capillary: 96 mg/dL (ref 70–99)
Glucose-Capillary: 97 mg/dL (ref 70–99)
Glucose-Capillary: 99 mg/dL (ref 70–99)

## 2011-03-03 LAB — CBC
HCT: 36.4 % (ref 36.0–46.0)
Hemoglobin: 12.5 g/dL (ref 12.0–15.0)
Hemoglobin: 13.2 g/dL (ref 12.0–15.0)
MCHC: 34.4 g/dL (ref 30.0–36.0)
Platelets: 309 10*3/uL (ref 150–400)
Platelets: 308 10*3/uL (ref 150–400)
RBC: 4.55 MIL/uL (ref 3.87–5.11)
RDW: 14.9 % (ref 11.5–15.5)
RDW: 14.9 % (ref 11.5–15.5)
WBC: 14.7 10*3/uL — ABNORMAL HIGH (ref 4.0–10.5)

## 2011-03-03 LAB — URINE CULTURE
Colony Count: >=100000
Colony Count: >=100000

## 2011-03-03 LAB — COMPREHENSIVE METABOLIC PANEL
AST: 33 U/L (ref 0–37)
Albumin: 3.5 g/dL (ref 3.5–5.2)
Alkaline Phosphatase: 67 U/L (ref 39–117)
BUN: 9 mg/dL (ref 6–23)
Chloride: 99 mEq/L (ref 96–112)
GFR calc Af Amer: >60 mL/min (ref >60)
Potassium: 4.5 mEq/L (ref 3.5–5.1)
Total Bilirubin: 1 mg/dL (ref 0.3–1.2)
Total Protein: 6.5 g/dL (ref 6.0–8.3)

## 2011-03-03 LAB — DIFFERENTIAL
Basophils Absolute: 0.2 10*3/uL — ABNORMAL HIGH (ref 0.0–0.1)
Eosinophils Relative: 1 % (ref 0–5)
Lymphocytes Relative: 22 % (ref 12–46)
Monocytes Absolute: 1.1 10*3/uL — ABNORMAL HIGH (ref 0.1–1.0)
Monocytes Relative: 7 % (ref 3–12)
Neutro Abs: 10.1 10*3/uL — ABNORMAL HIGH (ref 1.7–7.7)

## 2011-03-03 LAB — URINALYSIS, ROUTINE W REFLEX MICROSCOPIC
Glucose, UA: NEGATIVE mg/dL
Ketones, ur: 15 mg/dL — AB
Specific Gravity, Urine: 1.029 (ref 1.005–1.030)
pH: 6 (ref 5.0–8.0)

## 2011-03-03 LAB — BASIC METABOLIC PANEL
BUN: 6 mg/dL (ref 6–23)
CO2: 26 mEq/L (ref 19–32)
GFR calc non Af Amer: >60 mL/min (ref >60)
Glucose, Bld: 112 mg/dL — ABNORMAL HIGH (ref 70–99)
Potassium: 3.9 mEq/L (ref 3.5–5.1)
Sodium: 138 mEq/L (ref 135–145)

## 2011-03-03 LAB — URINE MICROSCOPIC-ADD ON

## 2011-03-04 LAB — GLUCOSE, CAPILLARY
Glucose-Capillary: 100 mg/dL — ABNORMAL HIGH (ref 70–99)
Glucose-Capillary: 105 mg/dL — ABNORMAL HIGH (ref 70–99)
Glucose-Capillary: 161 mg/dL — ABNORMAL HIGH (ref 70–99)
Glucose-Capillary: 119 mg/dL — ABNORMAL HIGH (ref 70–99)

## 2011-03-04 LAB — CBC
HCT: 45.1 % (ref 36.0–46.0)
Hemoglobin: 14 g/dL (ref 12.0–15.0)
Hemoglobin: 14.8 g/dL (ref 12.0–15.0)
MCHC: 32.7 g/dL (ref 30.0–36.0)
MCV: 86.9 fL (ref 78.0–100.0)
RBC: 5.2 MIL/uL — ABNORMAL HIGH (ref 3.87–5.11)
RDW: 15.1 % (ref 11.5–15.5)
RDW: 15.9 % — ABNORMAL HIGH (ref 11.5–15.5)

## 2011-03-04 LAB — COMPREHENSIVE METABOLIC PANEL
AST: 26 U/L (ref 0–37)
BUN: 12 mg/dL (ref 6–23)
CO2: 25 mEq/L (ref 19–32)
Calcium: 9.1 mg/dL (ref 8.4–10.5)
Creatinine, Ser: 0.92 mg/dL (ref 0.4–1.2)
GFR calc Af Amer: >60 mL/min (ref >60)
GFR calc non Af Amer: >60 mL/min (ref >60)
Glucose, Bld: 148 mg/dL — ABNORMAL HIGH (ref 70–99)

## 2011-03-04 LAB — URINALYSIS, ROUTINE W REFLEX MICROSCOPIC
Bilirubin Urine: NEGATIVE
Ketones, ur: 15 mg/dL — AB
Nitrite: NEGATIVE
Protein, ur: NEGATIVE mg/dL

## 2011-03-04 LAB — LIPASE, BLOOD: Lipase: 27 U/L (ref 11–59)

## 2011-03-04 LAB — PREPARE FRESH FROZEN PLASMA

## 2011-03-04 LAB — DIFFERENTIAL
Lymphocytes Relative: 10 % — ABNORMAL LOW (ref 12–46)
Lymphs Abs: 1.4 10*3/uL (ref 0.7–4.0)
Neutro Abs: 12.6 10*3/uL — ABNORMAL HIGH (ref 1.7–7.7)
Neutrophils Relative %: 88 % — ABNORMAL HIGH (ref 43–77)

## 2011-03-04 LAB — APTT
aPTT: 26 seconds (ref 24–37)
aPTT: 36 seconds (ref 24–37)

## 2011-03-04 LAB — BASIC METABOLIC PANEL
CO2: 25 mEq/L (ref 19–32)
Calcium: 9.2 mg/dL (ref 8.4–10.5)
Chloride: 108 mEq/L (ref 96–112)
GFR calc Af Amer: >60 mL/min (ref >60)
Glucose, Bld: 90 mg/dL (ref 70–99)
Potassium: 4.5 mEq/L (ref 3.5–5.1)
Sodium: 141 mEq/L (ref 135–145)

## 2011-03-04 LAB — PROTIME-INR
INR: 2.5 — ABNORMAL HIGH (ref 0.00–1.49)
Prothrombin Time: 15.7 seconds — ABNORMAL HIGH (ref 11.6–15.2)
Prothrombin Time: 28.6 seconds — ABNORMAL HIGH (ref 11.6–15.2)

## 2011-03-04 LAB — URINE MICROSCOPIC-ADD ON

## 2011-03-04 LAB — TYPE AND SCREEN: Antibody Screen: NEGATIVE
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
## 2011-04-06 NOTE — Op Note (Signed)
[name], [name]           ACCOUNT NO.:  [id number]   MEDICAL RECORD NO.:  [id number]          PATIENT TYPE:  AMB   LOCATION:  SDC                           FACILITY:  [facility]   PHYSICIAN:  Osborn Coho, M.D.   DATE OF BIRTH:  1946/09/30   DATE OF PROCEDURE:  01/31/2009  DATE OF DISCHARGE:                               OPERATIVE REPORT   PREOPERATIVE DIAGNOSES:  1. 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  2. High grade squamous intraepithelial lesions on Pap smear.  3. Discrepancy between Pap smear and biopsy.   POSTOPERATIVE DIAGNOSES:  1. 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  2. High grade squamous intraepithelial lesions on Pap smear.  3. Discrepancy between Pap smear and biopsy.   PROCEDURE:  Loop electrosurgical excision procedure.   ATTENDING DOCTOR:  Osborn Coho, MD   ANESTHESIA:  General via LMA.   SPECIMENS TO PATHOLOGY:  Cervical LEEP, specimen was sutured at 12  o'clock.   FLUIDS:  500 mL.   URINE OUTPUT:  Quantity sufficient via straight cath prior to procedure.   ESTIMATED BLOOD LOSS:  Minimal.   COMPLICATIONS:  None.   PROCEDURE:  The patient was taken to the operating room after the risks,  benefits, and alternatives were discussed with the patient.  The patient  verbalized understanding and consent signed and witnessed.  The patient  was placed under general anesthesia and prepped and draped in the normal  sterile fashion in the dorsal lithotomy position.  A bivalve speculum  was placed in the patient's vagina and the anterior lip of the cervix  was grasped with a single-tooth tenaculum.  A paracervical block was  administered using a total of 10 mL of 1% lidocaine.  Acetic acid was  placed on the cervix and colposcopy performed and then LEEP performed  using a loop of size 20 mm x 12 mm.  The base of the cervix was then  cauterized with a ball cautery tip and good hemostasis was noted.  Monsel solution was applied to assure  hemostasis.  The tenaculum was  removed.  There was good hemostasis at the tenaculum site.  The  LEEP specimen was sutured at the 12 o'clock position.  All instruments  were removed.  Sponge, lap, and needle count was correct.  The patient  tolerated the procedure well and is currently awaiting transfer to the  recovery room in good condition.     ______________________________  Schuyler Amor, M.D.      Osborn Coho, M.D.  Electronically Signed    RWG/MEDQ  D:  01/31/2009  T:  01/31/2009  Job:  [id number]
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
## 2011-04-06 NOTE — Consult Note (Signed)
NAMEDAYNE, DEKAY           ACCOUNT NO.:  [id number]   MEDICAL RECORD NO.:  [id number]          PATIENT TYPE:  INP   LOCATION:  3728                         FACILITY:  [facility]   PHYSICIAN:  Pramod P. Pearlean Brownie, MD    DATE OF BIRTH:  1946/01/19   DATE OF CONSULTATION:  DATE OF DISCHARGE:                                 CONSULTATION   REFERRING PHYSICIAN:  Jerelyn Scott, MD   REASON FOR REFERRAL:  TIA.   HISTORY OF PRESENT ILLNESS:  Ms. Hannibal is a 65 year old lady with  known history of subdural hematoma in March 2010 and multiple strokes in  the past with residual left hemiparesis who states today she had a fall  at home because her left leg and knee gave out.  She did not lose  consciousness.  Denies any headache or accompanying neurological  symptoms in the form of slurred speech, left hand weakness, numbness.  She states this is not the first time this has happened.  He left leg  gives out quite often.  She has had multiple falls.  She was on aspirin  and Plavix in March and she had a subdural from one such falls and these  medications are discontinued.   PAST MEDICAL HISTORY:  Significant for,  1. Hypertension.  2. Gastroesophageal reflux disease.  3. Mild dementia.  4. Diabetes.  5. Multiple strokes.  6. Left-sided weakness.   MEDICATIONS ALLERGIES:  PENICILLIN causes hives.   SOCIAL HISTORY:  The patient lives with daughter.  She smokes half pack  per day.  Does not drink alcohol.   FAMILY HISTORY:  Noncontributory.   REVIEW OF SYSTEMS:  Negative for recent fever, cough, chest pain,  diarrhea, or illness.   PHYSICAL EXAMINATION:  GENERAL:  A 65 year old African American lady who  is currently not in distress.  VITAL SIGNS:  She is afebrile, pulse rate is 82 per minute and regular,  blood pressure 155/88, respiratory 10 per minute, temperature 98.4,  oxygen sats 95% on room air.  HEAD:  Nontraumatic.  NECK:  Supple.  There is no bruit.  ENT:  Unremarkable.  CARDIAC:  No murmur or gallop.  LUNGS:  Clear to auscultation.  ABDOMEN:  Soft, nontender.  NEUROLOGICAL:  She is awake, alert, oriented to time, place, and person.  She has diminished attention, short-term memory, registration, and  recall.  She follows simple commands well.  Eye movements are full  range.  Face is symmetric with minimum left nasolabial fold weakness.  She has left homonymous hemianopsia.  Tongue is midline.  Palatal  movements normal.  Jaw jerk is brisk.  Motor system exam reveals no  upper extremity drift, but fine finger movements are diminished on the  left hand.  Left grip is weak compared to the right.  She orbits the  right over left upper extremity.  She has significant left lower  extremity weakness with 2/5 strength proximally and distally.  Tone is  increased in the left leg.  Deep tendon reflexes are 2+, symmetric, and  slightly brisk on the left.  Left plantar is equivocal.  Right is  downgoing.  She has diminished touch pinprick sensation in the left  hemibody.  Coordination is slow, but impaired on the left.  Gait was not  tested.   DATA REVIEWED:  WBC count and electrolytes are normal.  CT scan shows  multifocal areas of infarction bilaterally.  There is a large infarct in  the left basal ganglia as well as anterior cerebral artery distribution  with compensated dilatation of the left lateral ventricle.  The right  lateral ventricle is also dilated.  There is generalized atrophy and  changes of small vessel disease.  No acute abnormalities are noted.   IMPRESSION:  A 65 year old lady with falls related to left leg giving  out, which is probably a chronic deficit from her previous stroke, I do  not think her presentation suggest new right brain stroke.   PLAN:  I would not recommend further stroke workup, but start aspirin as  her previous subdural from March seems to have resolved and is barely  visible.  Physical Therapy consult for gait and balance  training.  Continue previous home medications.  Kindly call for questions.  Discussed the case with Dr. Lily Peer, the admitting physician and  answered questions.           ______________________________  Sunny Schlein. Pearlean Brownie, MD     PPS/MEDQ  D:  07/24/2009  T:  07/25/2009  Job:  [id number]

## 2011-04-06 NOTE — H&P (Signed)
[name], [name]           ACCOUNT NO.:  [id number]   MEDICAL RECORD NO.:  [id number]          PATIENT TYPE:  INP   LOCATION:  3728                         FACILITY:  [facility]   PHYSICIAN:  Marinda Elk, M.D.DATE OF BIRTH:  [birth year]   DATE OF ADMISSION:  07/24/2009  DATE OF DISCHARGE:                              HISTORY & PHYSICAL   ADDENDUM   Please see previous H&P for complete details.  I spoke with Dr. Tressie Stalker at [facility] concerning restarting  the patient's aspirin and Plavix in light of subdural hematoma in March  2010.  Given that CT of the head done this admission is negative for any  bleeding and the patient had extremely high risk for recurrent CVAs, we  will restart aspirin and Plavix at this time.      Cordelia Pen, NP      Marinda Elk, M.D.  Electronically Signed    LE/MEDQ  D:  07/24/2009  T:  07/25/2009  Job:  [id number]
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
## 2011-04-07 IMAGING — CT CT HEAD W/O CM
1 series · 15 of 30 positions shown, 19 images · non-contrast
Comparison: 02/23/2009.

CLINICAL DATA: Subdural hematoma follow-up.

CT HEAD WITHOUT CONTRAST
TECHNIQUE: Contiguous axial images were obtained from the base of
the skull through the vertex without contrast.

[Series 2: head routine 4.8 h37s · axial · 0.43mm/px · z∈[+663,+802]mm · 15 of 30 slices shown, 19 images]
[im 2/30  brain]
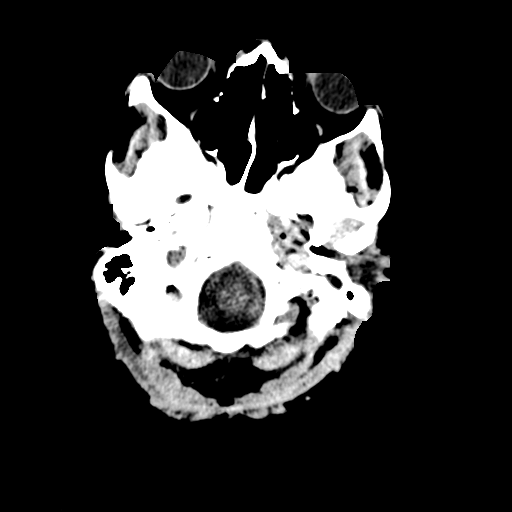
[im 2/30  bone]
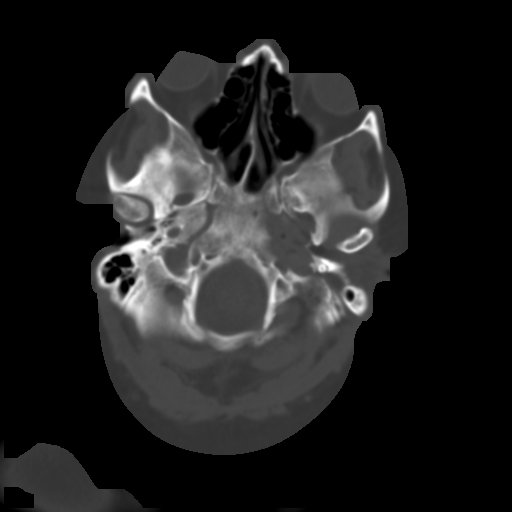
[im 4/30  brain]
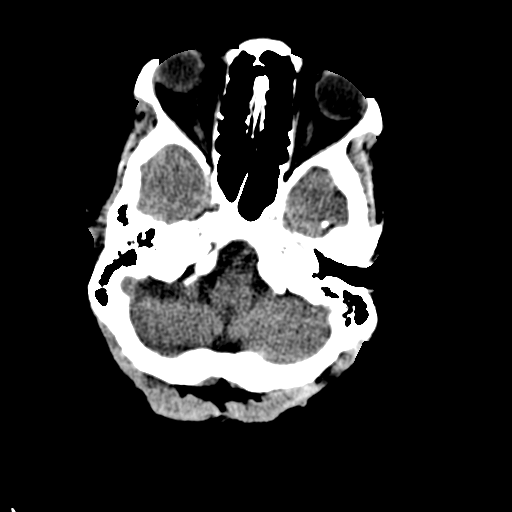
[im 6/30  brain]
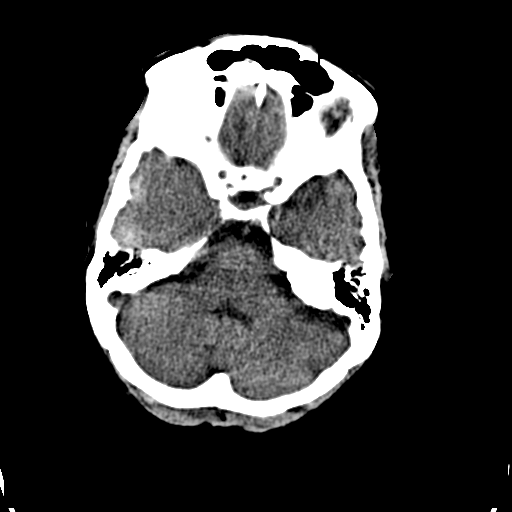
[im 8/30  brain]
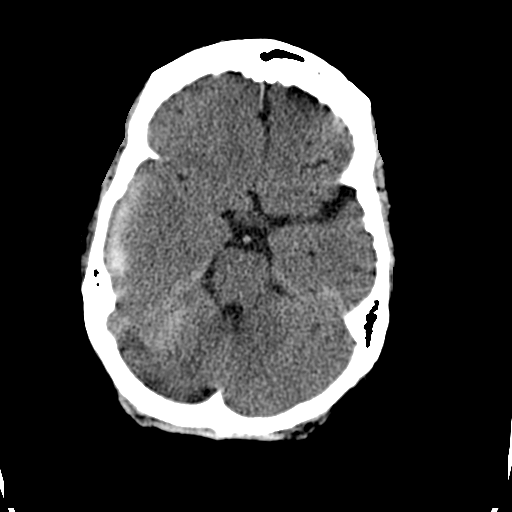
[im 10/30  brain]
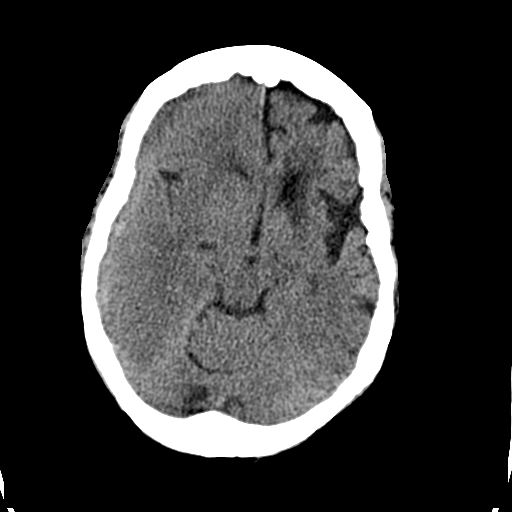
[im 10/30  bone]
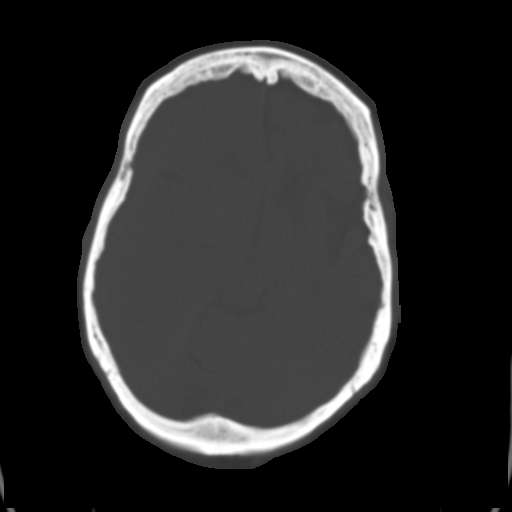
[im 12/30  brain]
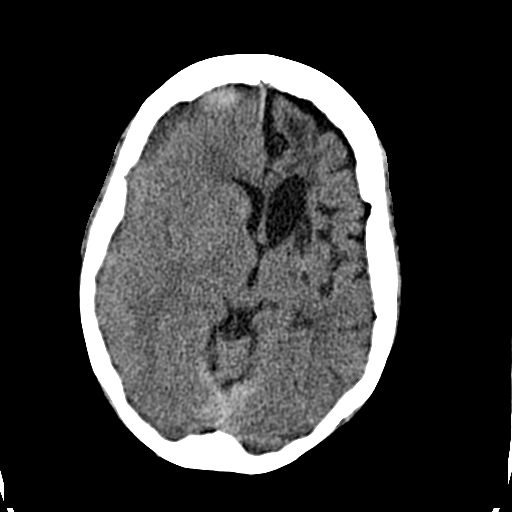
[im 14/30  brain]
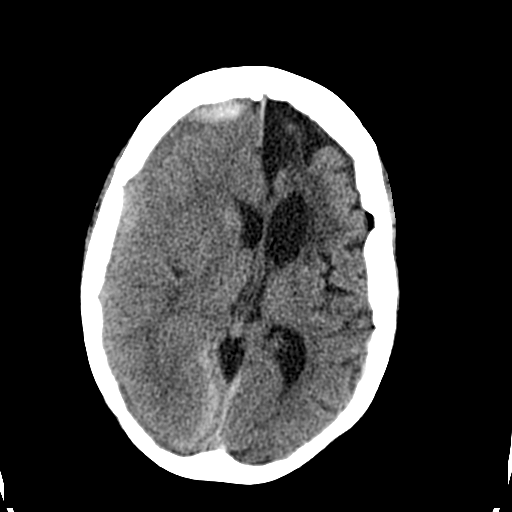
[im 16/30  brain]
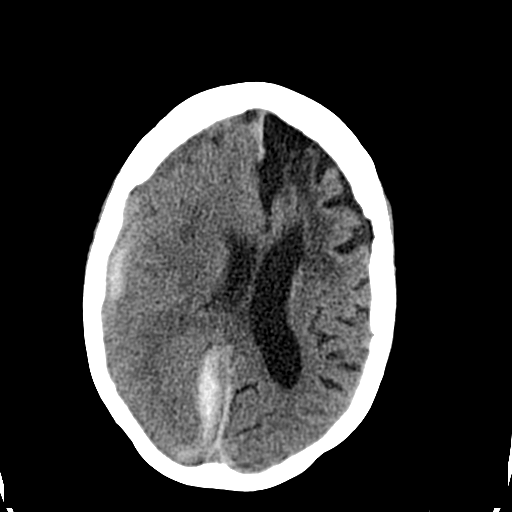
[im 17/30  brain]
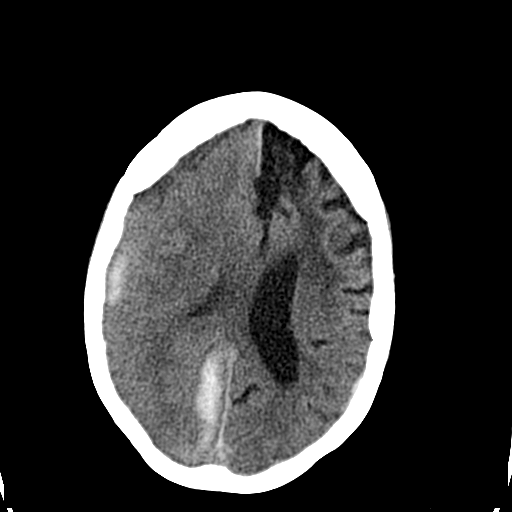
[im 17/30  bone]
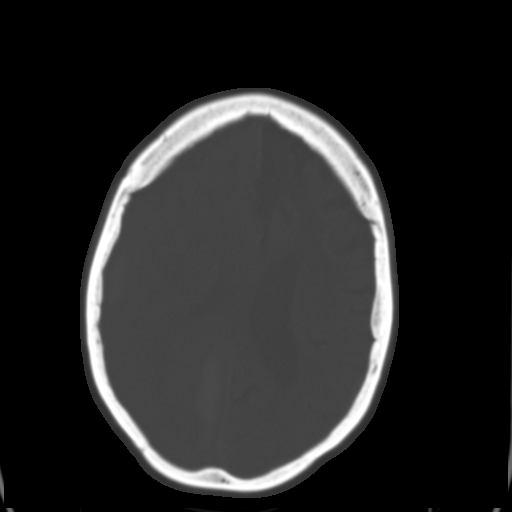
[im 19/30  brain]
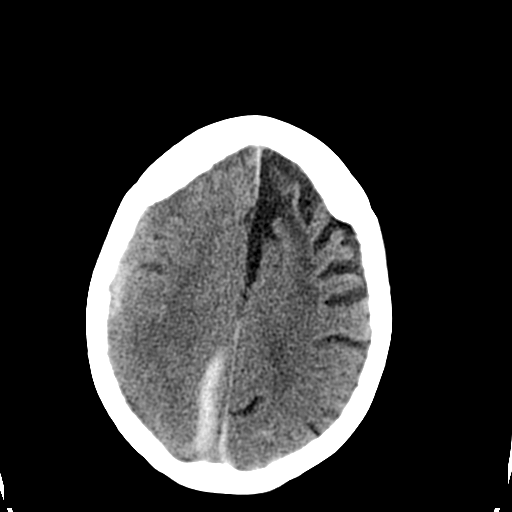
[im 21/30  brain]
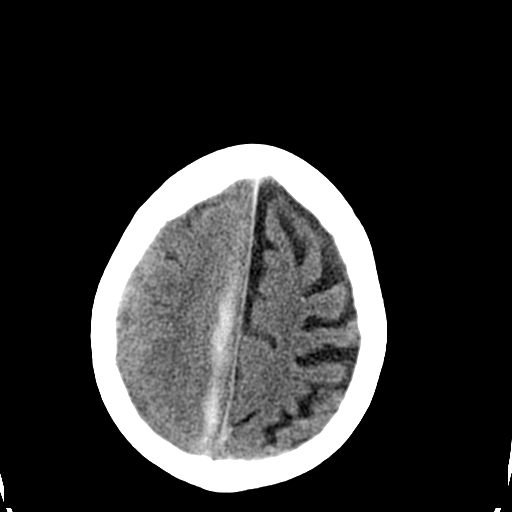
[im 23/30  brain]
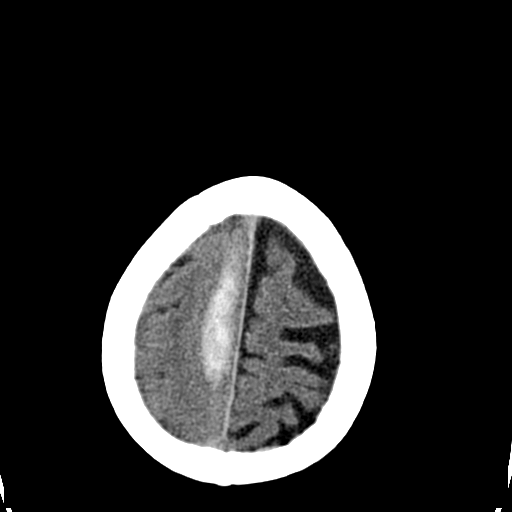
[im 25/30  brain]
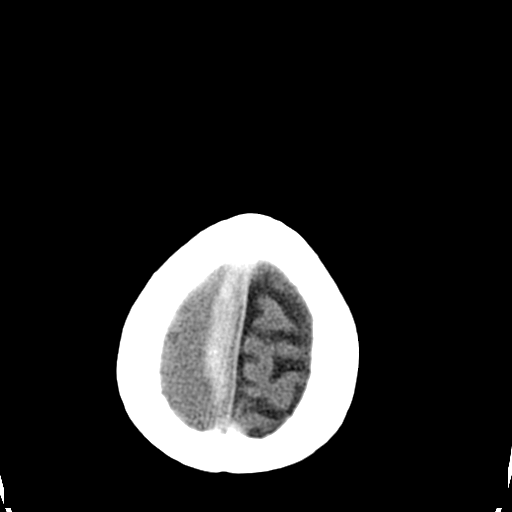
[im 25/30  bone]
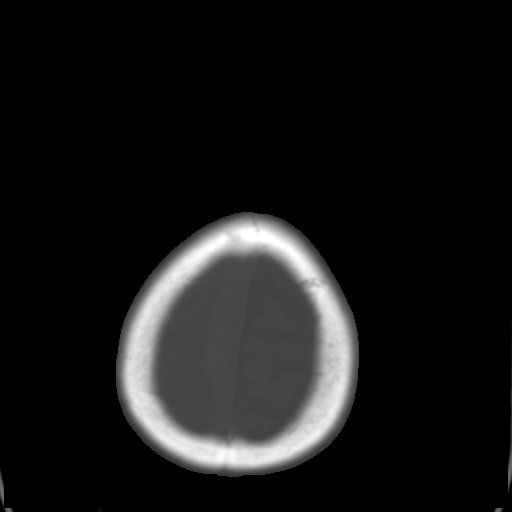
[im 27/30  brain]
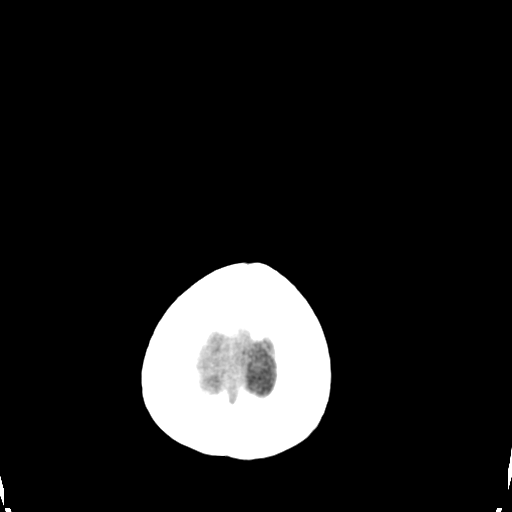
[im 29/30  brain]
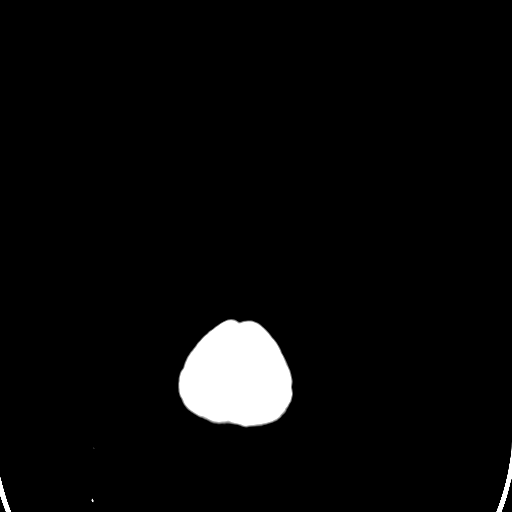

[15 of 30 positions shown; findings below may reference images not displayed]

FINDINGS: Extensive subdural hematoma on the right is again noted.
The subdural blood is becoming mixed high and low density
compatible with aging of the subdural hematoma.  No new hemorrhage
is identified.  The largest component is in the interhemispheric
fissure on the right.  This area may be slightly smaller.

There is increase in mass effect and edema in the right hemisphere.
There is increased midline shift which now measures 8 mm.  Chronic
infarct in the left frontal lobe is present.  There is no acute
infarct.
IMPRESSION: Extensive subdural hematoma on the right is becoming progressively
isodense with brain.  No new hemorrhage is identified.

Interval increase in edema in the right hemisphere with increase of
midline shift which now measures 8 mm.

The report was called to  [REDACTED], in the ICU, on 02/28/2009 at
other [DATE] hours.

## 2011-04-09 NOTE — Discharge Summary (Signed)
[name], [name]                        ACCOUNT NO.:  [id number]   MEDICAL RECORD NO.:  [id number]                   PATIENT TYPE:  IPS   LOCATION:  4002                                 FACILITY:  [facility]   PHYSICIAN:  Erick Colace, M.D.           DATE OF BIRTH:  11/12/1945   DATE OF ADMISSION:  07/25/2003  DATE OF DISCHARGE:  08/08/2003                                 DISCHARGE SUMMARY   DISCHARGE DIAGNOSES:  1. Recurrent cerebrovascular accident with left hemiplegia.  2. Coumadin and Plavix for cerebrovascular accident prophylaxis.  3. Non-insulin-dependent diabetes mellitus.  4. Hypertension.  5. Gastroesophageal reflux disease.  6. Tobacco abuse.   HISTORY OF PRESENT ILLNESS:  [age]-year-old black female with history  of cerebrovascular accident and left hemiparesis on chronic Coumadin.  Admitted [facility] August 28th with altered mental status,  mild slurred speech.  No chest pain, nausea, or vomiting.  On evaluation,  INR 2.2.  Cranial CT scan with old left basal ganglia infarction and  generalized changes.  No hemorrhage noted. Carotid duplex negative.  Echocardiogram with ejection fraction of 50%. No effusion.  Subcutaneous  Lovenox was added for deep vein thrombosis prophylaxis.  Plavix was added in  combination with Coumadin secondary to her history of cerebrovascular  accident.  Hospital course was uneventful.  Blood sugars variable.  Moderate  assistance for ambulation.  Latest chemistries with INR of 1.7, hemoglobin  15.4.  Admitted for comprehensive rehab program.   PAST MEDICAL HISTORY:  See discharge diagnoses.   ALLERGIES:  PENICILLIN.   PRIMARY PHYSICIAN:  Dr. Marlyn Corporal.   MEDICATIONS PRIOR TO ADMISSION:  Coumadin, Maxzide, atenolol,  spironolactone, Advair, Zoloft, and Amaryl; doses were not made available.   SOCIAL HISTORY:  Per chart. Lives in [place], [place], alone.  Independent prior to admission.  One level home,  three steps to entry.  Mother to assist on discharge. Family in area works.   HOSPITAL COURSE:  Patient with progressive gains while in rehab services  with therapies initiated on a b.i.d. basis.  The following issues are  followed during patient's rehab course.  Pertaining to the Ms. Kosloski's  recurrent cerebrovascular accident now on combination of Plavix and Coumadin  therapy, there were no bleeding episodes. She will be followed by Dr. Marlyn Corporal  on her discharge for Coumadin therapy, with INR of 2.3 on day of discharge.  She was ambulating on the rehab unit with the use of assistive device,  needing very minimal cues for her overall safety. She was supervision bed  mobility, minimal assistance for her transfers, supervision upper body  dressing, supervision to minimal assist lower body  dressing. Home health  therapies will be ongoing at time of her discharge. Blood sugars had some  increased variables. Her Glucophage was increased to 850 mg twice daily and  monitored. She would also continue on her Amaryl. Blood pressure is  controlled with Tenormin. There were no  orthostatic changes. She was on  subcutaneous Lovenox until her INR was greater than 2, and this was later  discontinued. She had a history of tobacco abuse. She had stated she had  quit tobacco products, and it was discussed at length all issues of smoking.  She had no bowel or bladder disturbances other than occasional constipation.   Latest labs showed a hemoglobin 14.1, hematocrit 41.8, sodium 139, potassium  3.9, BUN 10, creatinine 1.0.  She will be discharged to home with home  health therapies as recommended, to follow up with her primary MD in one to  two weeks, and follow up with the rehab physician assistant, and to follow  up outpatient rehab clinic.   DISCHARGE MEDICATIONS:  1. Coumadin 5 mg daily.  2. Plavix 75 mg daily.  3. Atenolol 25 mg twice daily.  4. Amaryl 4 mg daily.  5. Glucophage 850 mg b.i.d.   6. Pepcid 20 mg daily.   ACTIVITY:  As tolerated with cane. No smoking, alcohol, or driving.   DIET:  Diabetic diet.   Home health nurse per [facility] to draw prothrombin time on  August 12, 2003, with results to Dr. Marlyn Corporal.      [name], P.A.                     Erick Colace, M.D.    DA/MEDQ  D:  08/12/2003  T:  08/13/2003  Job:  [id number]   cc:   Erick Colace, M.D.  [address] [place]  [place] [address]  Fax: [phone]   [facility] Dr. Cecille Aver

## 2011-04-14 IMAGING — CT CT HEAD W/O CM
1 of 2 series · 14 of 30 positions shown, 18 images · non-contrast
Comparison: 02/28/2009.

CLINICAL DATA: The follow-up subdural hematoma.

CT HEAD WITHOUT CONTRAST
TECHNIQUE: Contiguous axial images were obtained from the base of
the skull through the vertex without contrast.

[Series 2: brain · axial · 0.47mm/px · z∈[+182,+306]mm · 14 of 60 slices shown, 18 images]
[im 4/60  brain]
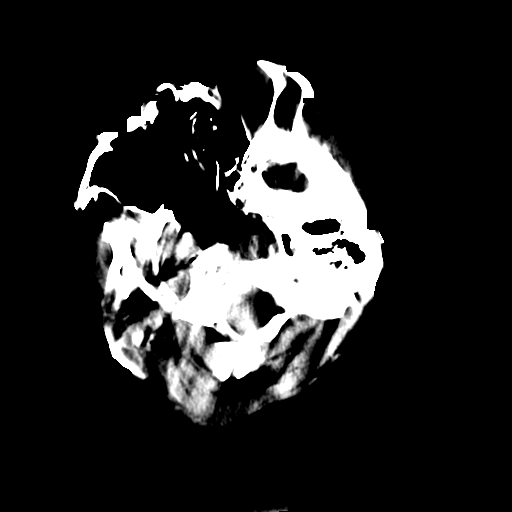
[im 4/60  bone]
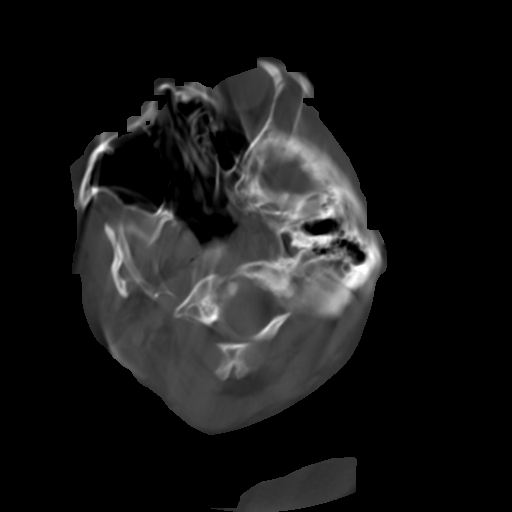
[im 8/60  brain]
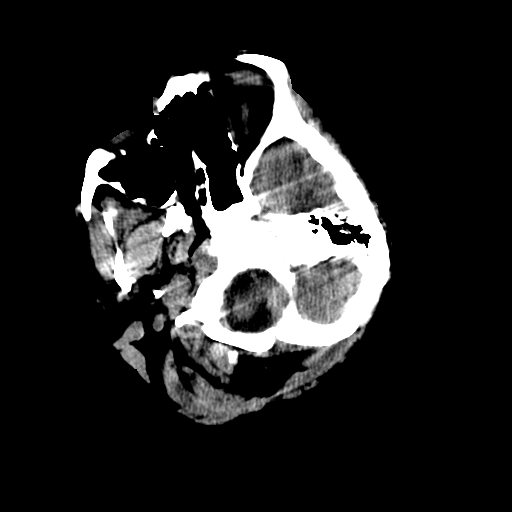
[im 12/60  brain]
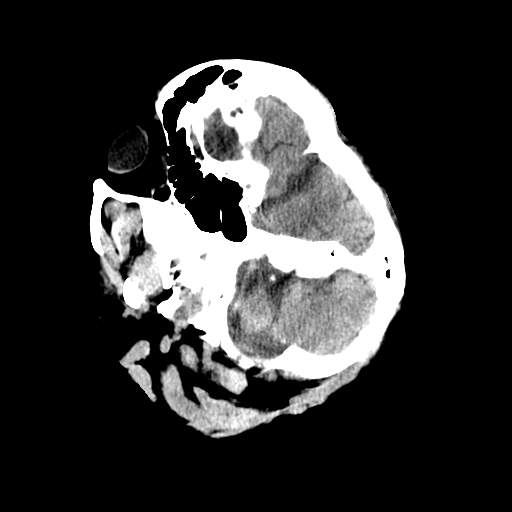
[im 16/60  brain]
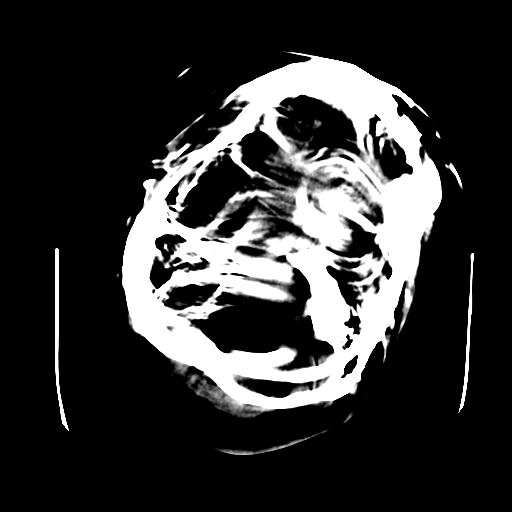
[im 20/60  brain]
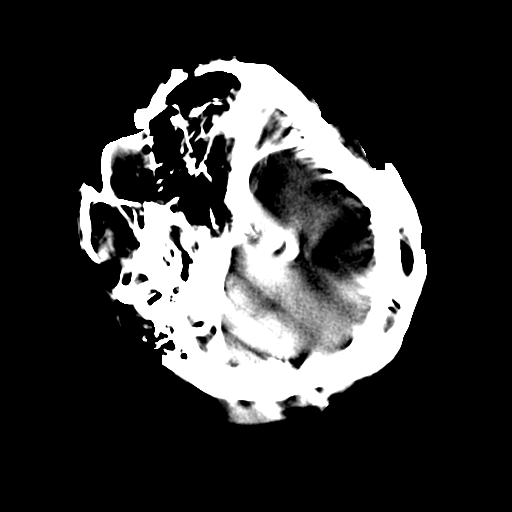
[im 20/60  bone]
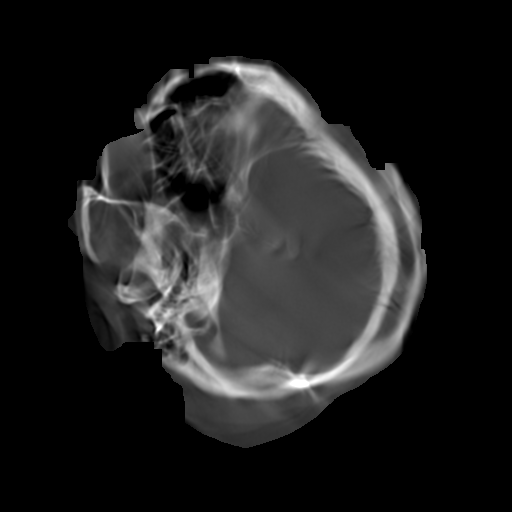
[im 24/60  brain]
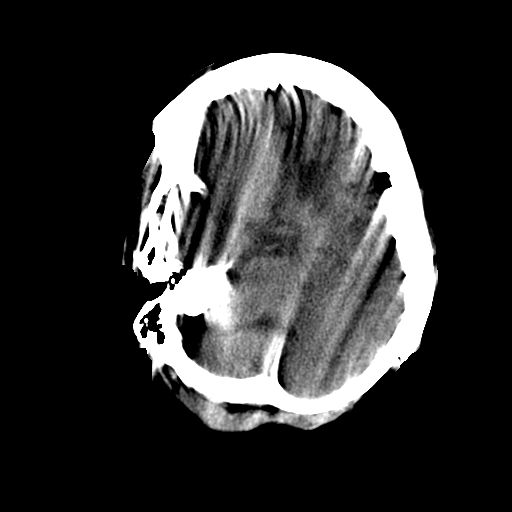
[im 28/60  brain]
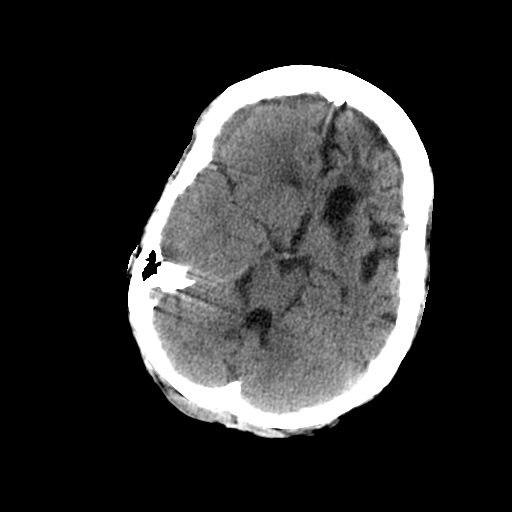
[im 32/60  brain]
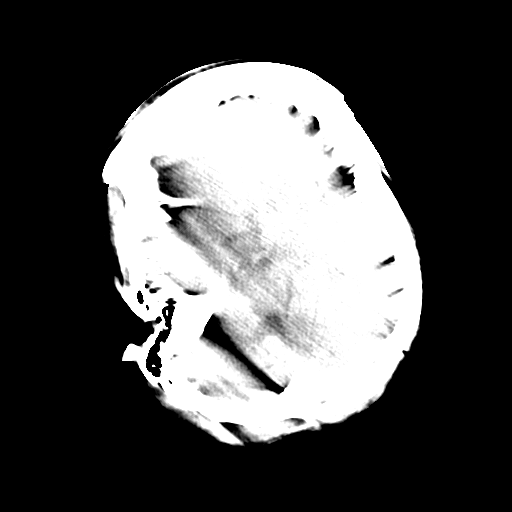
[im 36/60  brain]
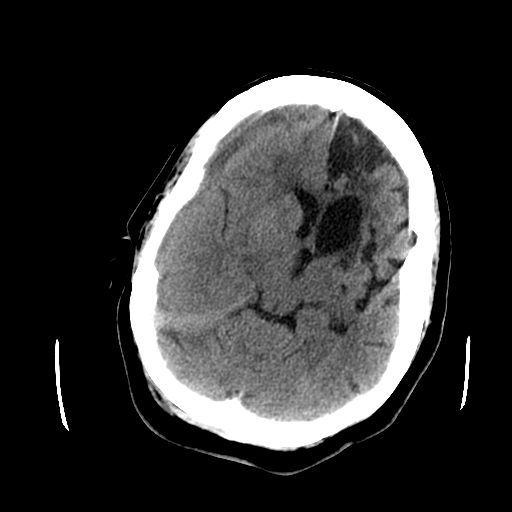
[im 36/60  bone]
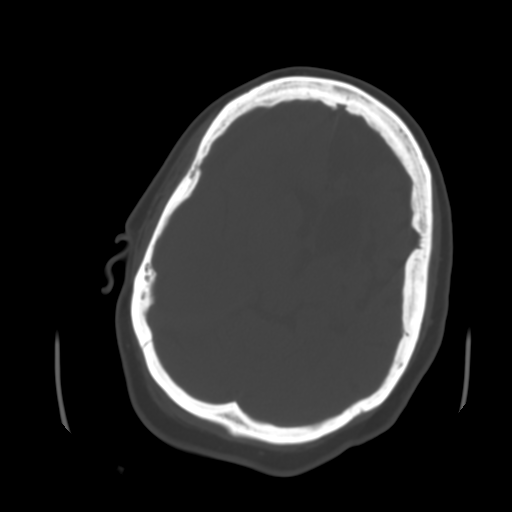
[im 40/60  brain]
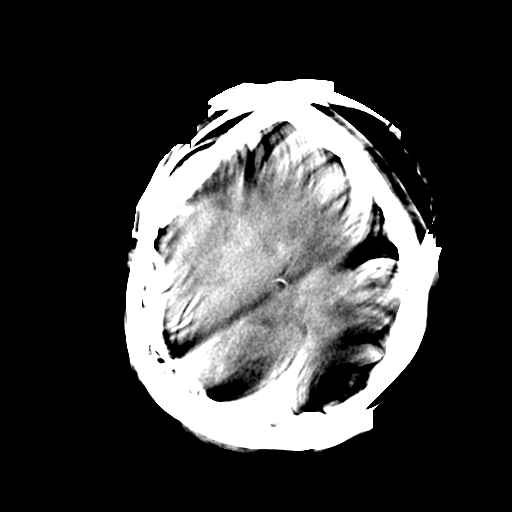
[im 44/60  brain]
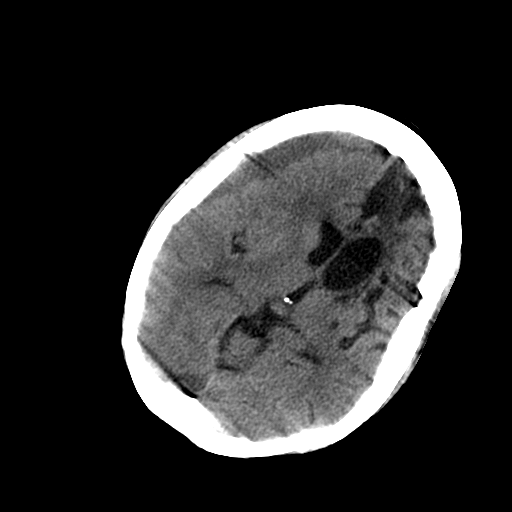
[im 48/60  brain]
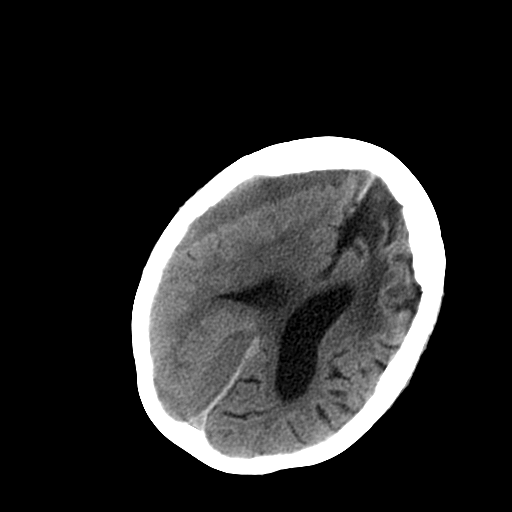
[im 52/60  brain]
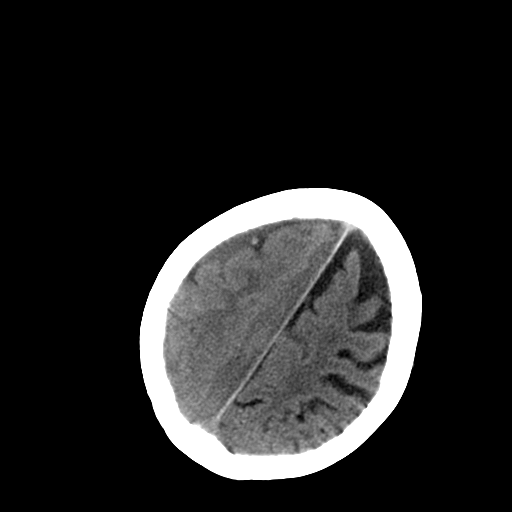
[im 52/60  bone]
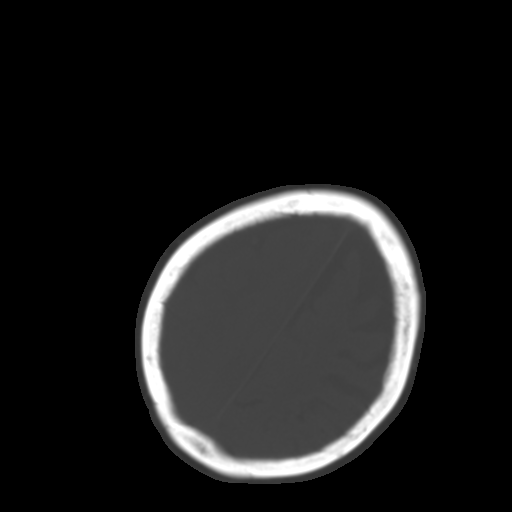
[im 56/60  brain]
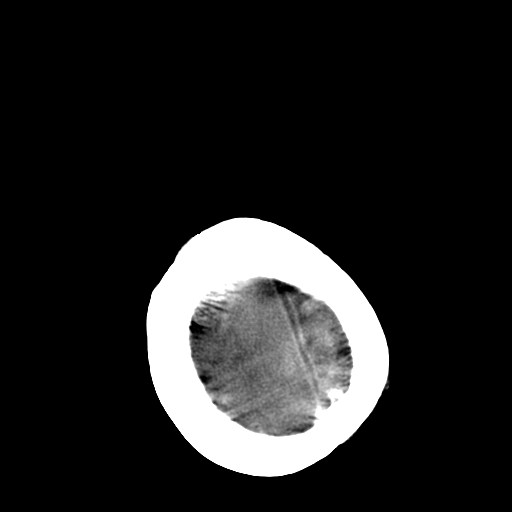

[14 of 30 positions shown; findings below may reference images not displayed]

FINDINGS: There is extensive patient motion.  Multiple attempts
were made of scanning patient.  On the limited images that have
less motion, the extent of the patient's right subdural collection
is stable to decreased.  No definite new hemorrhage is seen.  Rule
there is persistent midline shift at the foramen of [place] measuring
6 mm.  This compares with previous measurements of 8 mm.  Rule
extensive white matter changes are again noted.
IMPRESSION: 1.  No definite change in the patient's right subdural collection.
2.  No definite new hemorrhage.
3.  Sensitivity and specificity of the exam is extremely
compromised due to extensive patient motion.  Short-term follow-up
or repeat scan would be useful to validate these findings if
patient motion can be better controlled.

## 2011-06-13 ENCOUNTER — Emergency Department (HOSPITAL_COMMUNITY): Payer: Medicare Other

## 2011-06-13 ENCOUNTER — Observation Stay (HOSPITAL_COMMUNITY)
Admission: EM | Admit: 2011-06-13 | Discharge: 2011-06-14 | Disposition: A | Discharge status: Home / Self Care | Payer: Medicare Other | Attending: General Surgery | Admitting: General Surgery

## 2011-06-13 DIAGNOSIS — Z8673 Personal history of transient ischemic attack (TIA), and cerebral infarction without residual deficits: Secondary | ICD-10-CM | POA: Insufficient documentation

## 2011-06-13 DIAGNOSIS — Y92009 Unspecified place in unspecified non-institutional (private) residence as the place of occurrence of the external cause: Secondary | ICD-10-CM | POA: Insufficient documentation

## 2011-06-13 DIAGNOSIS — F0390 Unspecified dementia without behavioral disturbance: Secondary | ICD-10-CM | POA: Insufficient documentation

## 2011-06-13 DIAGNOSIS — Z01818 Encounter for other preprocedural examination: Secondary | ICD-10-CM | POA: Insufficient documentation

## 2011-06-13 DIAGNOSIS — I10 Essential (primary) hypertension: Secondary | ICD-10-CM | POA: Insufficient documentation

## 2011-06-13 DIAGNOSIS — F17200 Nicotine dependence, unspecified, uncomplicated: Secondary | ICD-10-CM | POA: Insufficient documentation

## 2011-06-13 DIAGNOSIS — W010XXA Fall on same level from slipping, tripping and stumbling without subsequent striking against object, initial encounter: Secondary | ICD-10-CM | POA: Insufficient documentation

## 2011-06-13 DIAGNOSIS — E119 Type 2 diabetes mellitus without complications: Secondary | ICD-10-CM

## 2011-06-13 DIAGNOSIS — Y998 Other external cause status: Secondary | ICD-10-CM | POA: Insufficient documentation

## 2011-06-13 DIAGNOSIS — R109 Unspecified abdominal pain: Secondary | ICD-10-CM | POA: Insufficient documentation

## 2011-06-13 DIAGNOSIS — Z01810 Encounter for preprocedural cardiovascular examination: Secondary | ICD-10-CM | POA: Insufficient documentation

## 2011-06-13 DIAGNOSIS — M542 Cervicalgia: Secondary | ICD-10-CM | POA: Insufficient documentation

## 2011-06-13 DIAGNOSIS — S42213A Unspecified displaced fracture of surgical neck of unspecified humerus, initial encounter for closed fracture: Principal | ICD-10-CM | POA: Insufficient documentation

## 2011-06-13 DIAGNOSIS — Z79899 Other long term (current) drug therapy: Secondary | ICD-10-CM | POA: Insufficient documentation

## 2011-06-13 DIAGNOSIS — S0180XA Unspecified open wound of other part of head, initial encounter: Secondary | ICD-10-CM | POA: Insufficient documentation

## 2011-06-13 DIAGNOSIS — Y93E1 Activity, personal bathing and showering: Secondary | ICD-10-CM | POA: Insufficient documentation

## 2011-06-13 DIAGNOSIS — S0100XA Unspecified open wound of scalp, initial encounter: Secondary | ICD-10-CM

## 2011-06-13 DIAGNOSIS — S42309A Unspecified fracture of shaft of humerus, unspecified arm, initial encounter for closed fracture: Secondary | ICD-10-CM

## 2011-06-13 LAB — HEPATIC FUNCTION PANEL
Albumin: 3.2 g/dL — ABNORMAL LOW (ref 3.5–5.2)
Bilirubin, Direct: <0.1 mg/dL (ref 0.0–0.3)
Total Bilirubin: 0.3 mg/dL (ref 0.3–1.2)

## 2011-06-13 LAB — BASIC METABOLIC PANEL
GFR calc Af Amer: >60 mL/min (ref >60)
GFR calc non Af Amer: >60 mL/min (ref >60)
Glucose, Bld: 132 mg/dL — ABNORMAL HIGH (ref 70–99)
Potassium: 5.1 mEq/L (ref 3.5–5.1)
Sodium: 139 mEq/L (ref 135–145)

## 2011-06-13 LAB — URINALYSIS, ROUTINE W REFLEX MICROSCOPIC
Bilirubin Urine: NEGATIVE
Hgb urine dipstick: NEGATIVE
Specific Gravity, Urine: 1.021 (ref 1.005–1.030)
Urobilinogen, UA: 1 mg/dL (ref 0.0–1.0)

## 2011-06-13 LAB — LIPASE, BLOOD: Lipase: 32 U/L (ref 11–59)

## 2011-06-13 LAB — CK TOTAL AND CKMB (NOT AT ARMC)
CK, MB: 3 ng/mL (ref 0.3–4.0)
Relative Index: 1.3 (ref 0.0–2.5)

## 2011-06-13 LAB — GLUCOSE, CAPILLARY
Glucose-Capillary: 65 mg/dL — ABNORMAL LOW (ref 70–99)
Glucose-Capillary: 122 mg/dL — ABNORMAL HIGH (ref 70–99)

## 2011-06-13 LAB — URINE MICROSCOPIC-ADD ON

## 2011-06-14 LAB — GLUCOSE, CAPILLARY: Glucose-Capillary: 167 mg/dL — ABNORMAL HIGH (ref 70–99)

## 2011-06-24 NOTE — Consult Note (Signed)
  NAMEPAITON, [name] NO.:  [id number]  MEDICAL RECORD NO.:  [id number]  LOCATION:  5024                         FACILITY:  [facility]  PHYSICIAN:  Burnard Bunting, M.D.    DATE OF BIRTH:  1946/10/17  DATE OF CONSULTATION: DATE OF DISCHARGE:                                CONSULTATION   REQUESTING PHYSICIAN:  Dr. Lynelle [name].  CHIEF COMPLAINT:  Right shoulder pain.  HISTORY OF PRESENT ILLNESS:  [name] is a 65 year old female, who had a fall in the shower.  Today, she reports right shoulder pain, questionable loss of consciousness.  She denies any other orthopedic complaints.  She reports significant pain with any attempted range of motion of the right shoulder.  She was evaluated in the emergency room and admitted to the Trauma Service because of questionable loss of consciousness.  Her past medical history is notable for: 1. Diabetes. 2. Hypertension. 3. CVA. 4. Anticoagulant therapy. 5. Dementia.  Surgical history is negative.  The patient does not use drugs, occasionally smokes, and does not use alcohol.  Currently unemployed.  ALLERGIES:  She is allergic to PENICILLIN.  CURRENT MEDICATIONS:  Aspirin, Plavix, metformin, and Prilosec.  All systems reviewed and negative except the right shoulder.  PHYSICAL EXAMINATION:  GENERAL:  She is well developed, well nourished, in no acute distress, alert and oriented, answers questions appropriately, and appeared to be in too significant amount of pain. VITAL SIGNS:  Temperature 98.7, pulse 77, respirations 18, blood pressure 150/80. MUSCULOSKELETAL:  She has good range of motion in the left upper extremity, wrist, elbow, and shoulder.  Her right arm is in a sling.  On the right hand side, good EPL, FPL, interosseous wrist flexion/extension 5+/5.  Radial pulses intact.  Some swelling is present around the shoulder region.  Bilateral lower extremities demonstrate no groin pain to internal rotation of  leg.  Good dorsiflexion and plantarflexion of the feet.  Her neck is nontender.  Elbow range of motion is intact on the right hand side as well.  LABORATORY VALUES:  Sodium and potassium 139 and 5.1, BUN and creatinine 13 and 0.89.  Urinalysis is negative.  Chest x-ray negative.  Hip x-ray negative.  CT head, small vessel disease.  No acute hemorrhage.  She does have a nondisplaced right humeral neck fracture.  IMPRESSION:  Nondisplaced right proximal humerus fracture.  PLAN:  Shoulder immobilization for 3 weeks followed by progressive range of motion.  Repeat x-rays in 1 week in my office. I did discuss with [name] how this is likely going to take several months for her to regain her shoulder range of motion as nondisplaced fracture should heal rather quickly.  I do not anticipate operative therapy unless alignment changes significantly.  All of this was explained to the patient.     Burnard Bunting, M.D.     GSD/MEDQ  D:  06/13/2011  T:  06/14/2011  Job:  [id number]  Electronically Signed by Reece Agar.  [name] M.D. on 06/24/2011 04:54:09 AM

## 2011-06-28 NOTE — Discharge Summary (Signed)
  NAMESARALYNN, LANGHORST NO.:  [id number]  MEDICAL RECORD NO.:  [id number]  LOCATION:  5024                         FACILITY:  [facility]  PHYSICIAN:  Cherylynn Ridges, M.D.    DATE OF BIRTH:  11/30/1945  DATE OF ADMISSION:  06/13/2011 DATE OF DISCHARGE:  06/14/2011                              DISCHARGE SUMMARY   DISCHARGE DIAGNOSES: 1. Fall. 2. Right humerus fracture. 3. Facial laceration. 4. Diabetes. 5. Hypertension. 6. Status post  cerebrovascular accident, on Plavix. 7. Dementia. 8. Tobacco use.  CONSULTANTS:  Burnard Bunting, MD, for orthopedic surgery.  PROCEDURES:  Closure of facial laceration by Emergency Department staff.  HISTORY OF PRESENT ILLNESS:  This is a 64 year old black female who fell while in the shower.  This was not a syncopal event.  She did not lose consciousness.  She was confused initially, but this cleared up quickly. She came in as a non-trauma code.  Workup showed the humerus fracture and a small forehead laceration.  The latter was closed by the he Emergency Department staff and she was admitted by the Trauma Service and Orthopedic Surgery was consulted.  HOSPITAL COURSE:  The patient did well overnight in the hospital.  Her pain was well controlled on oral medication.  Orthopedic Surgery opted for nonoperative treatment of her humerus fracture.  She was mobilized with physical and occupational therapy and did well.  It was recommended that she have 24-hour supervision and assistance at home which the family said they could provide and so she was able to be discharged home in good condition in care of her daughter.  DISCHARGE MEDICATIONS:  Norco 5/325 take one-half to one p.o. q.4 h. p.r.n. pain #30 no refill.  In addition, she may resume her aspirin 81 mg daily, Flexeril 10 mg by mouth daily as needed for muscle spasm, metformin 5 mg twice daily, omeprazole 20 mg daily, and Plavix 75 mg daily.  FOLLOWUP:  The patient will need  to follow up with Dr. August Saucer and will call his office for an appointment.  She has elected to see her primary care provider for suture removal from her forehead laceration on Friday. If she has trouble getting in to see them, then she will give the Trauma Service call and we will see her back, but otherwise followup with [place] will be on an as-needed basis.     Earney Hamburg, P.A.   ______________________________ Cherylynn Ridges, M.D.    MJ/MEDQ  D:  06/14/2011  T:  06/15/2011  Job:  [id number]  cc:   G. Dorene Grebe, M.D.  Electronically Signed by Charma Igo P.A. on 06/28/2011 10:52:25 AM Electronically Signed by [name] M.D. on 06/28/2011 02:37:33 PM

## 2011-12-22 ENCOUNTER — Emergency Department (INDEPENDENT_AMBULATORY_CARE_PROVIDER_SITE_OTHER)
Admission: EM | Admit: 2011-12-22 | Discharge: 2011-12-22 | Disposition: A | Discharge status: Home / Self Care | Payer: Medicare Other | Source: Home / Self Care | Attending: Family Medicine | Admitting: Family Medicine

## 2011-12-22 ENCOUNTER — Encounter (HOSPITAL_COMMUNITY): Payer: Self-pay | Admitting: *Deleted

## 2011-12-22 DIAGNOSIS — L03019 Cellulitis of unspecified finger: Secondary | ICD-10-CM

## 2011-12-22 HISTORY — DX: Cerebral infarction, unspecified: I63.9

## 2011-12-22 HISTORY — DX: Essential (primary) hypertension: I10

## 2011-12-22 MED ORDER — DOXYCYCLINE HYCLATE 100 MG PO CAPS: 100.0000 mg | ORAL_CAPSULE | Freq: Two times a day (BID) | ORAL | Status: AC

## 2011-12-22 MED ORDER — HYDROCODONE-ACETAMINOPHEN 5-500 MG PO TABS: 1.0000 | ORAL_TABLET | Freq: Four times a day (QID) | ORAL | Status: AC | PRN

## 2011-12-22 NOTE — ED Provider Notes (Signed)
History     CSN: [id number]  Arrival date & time 12/22/11  1752   First MD Initiated Contact with Patient 12/22/11 1908      Chief Complaint  Patient presents with  . Hand Pain    (Consider location/radiation/quality/duration/timing/severity/associated sxs/prior treatment) Patient is a 66 y.o. female presenting with hand pain. The history is provided by the patient and a relative.  Hand Pain This is a new problem. The current episode started more than 2 days ago (recent false nail with cuticle treatment , developed pain and sts to distal phalanx of rif, ). The problem occurs constantly. The problem has been gradually worsening.    Past Medical History  Diagnosis Date  . Stroke   . Diabetes mellitus   . Hypertension     History reviewed. No pertinent past surgical history.  No family history on file.  History  Substance Use Topics  . Smoking status: Current Everyday Smoker  . Smokeless tobacco: Not on file  . Alcohol Use: No    OB History    Grav Para Term Preterm Abortions TAB SAB Ect Mult Living                  Review of Systems  Constitutional: Negative.   Musculoskeletal: Positive for joint swelling.  Skin: Positive for rash.    Allergies  Penicillins  Home Medications   Current Outpatient Rx  Name Route Sig Dispense Refill  . ASPIRIN 81 MG PO TABS Oral Take 160 mg by mouth daily.    . ATENOLOL 25 MG PO TABS Oral Take 25 mg by mouth daily.    Marland Kitchen CLOPIDOGREL BISULFATE 75 MG PO TABS Oral Take 75 mg by mouth daily.    Marland Kitchen LISINOPRIL 5 MG PO TABS Oral Take 5 mg by mouth daily.    Marland Kitchen METFORMIN HCL 500 MG PO TABS Oral Take 500 mg by mouth 2 (two) times daily with a meal.    . OMEPRAZOLE 20 MG PO CPDR Oral Take 20 mg by mouth daily.    Marland Kitchen DOXYCYCLINE HYCLATE 100 MG PO CAPS Oral Take 1 capsule (100 mg total) by mouth 2 (two) times daily. 20 capsule 0  . HYDROCODONE-ACETAMINOPHEN 5-500 MG PO TABS Oral Take 1-2 tablets by mouth every 6 (six) hours as needed for  pain. 15 tablet 0    BP 122/73  Pulse 82  Temp(Src) 99.1 F (37.3 C) (Oral)  Resp 18  SpO2 95%  Physical Exam  Nursing note and vitals reviewed. Constitutional: She appears well-developed and well-nourished.  Musculoskeletal: She exhibits tenderness.       Hands: Neurological: She is alert.    ED Course  NAIL REMOVAL Date/Time: 12/22/2011 7:13 PM Performed by: Barkley Bruns Authorized by: Barkley Bruns Consent: Verbal consent obtained. Consent given by: patient Patient understanding: patient states understanding of the procedure being performed Location: right hand Anesthesia: local infiltration Local anesthetic: lidocaine 2% without epinephrine Anesthetic total: 1.5 ml Patient sedated: no Preparation: skin prepped with Betadine Amount removed: complete Dressing: Xeroform gauze Patient tolerance: Patient tolerated the procedure well with no immediate complications.   (including critical care time)   Labs Reviewed  CULTURE, ROUTINE-ABSCESS   No results found.   1. Acute paronychia of finger       MDM          Barkley Bruns, MD 12/22/11 1921

## 2011-12-22 NOTE — ED Notes (Signed)
Pt  Has  A  painfull  Swollen     r  Pointer    Finger    X  3  Days  denys  Any  Injury  The  Nailbed  Is  Tender  And  Swollen  She  Actually has  A  False  Nail in place

## 2012-09-11 ENCOUNTER — Other Ambulatory Visit: Payer: Self-pay | Admitting: Internal Medicine

## 2012-09-11 DIAGNOSIS — Z1231 Encounter for screening mammogram for malignant neoplasm of breast: Secondary | ICD-10-CM

## 2012-10-10 ENCOUNTER — Ambulatory Visit
Admission: RE | Admit: 2012-10-10 | Discharge: 2012-10-10 | Disposition: A | Discharge status: Home / Self Care | Payer: Medicare Other | Source: Ambulatory Visit | Attending: Internal Medicine | Admitting: Internal Medicine

## 2012-10-10 DIAGNOSIS — Z1231 Encounter for screening mammogram for malignant neoplasm of breast: Secondary | ICD-10-CM

## 2012-12-15 ENCOUNTER — Encounter (HOSPITAL_COMMUNITY): Payer: Self-pay | Admitting: *Deleted

## 2012-12-15 ENCOUNTER — Emergency Department (HOSPITAL_COMMUNITY)
Admission: EM | Admit: 2012-12-15 | Discharge: 2012-12-16 | Disposition: A | Discharge status: Home / Self Care | Payer: Medicare Other | Attending: Emergency Medicine | Admitting: Emergency Medicine

## 2012-12-15 DIAGNOSIS — Z8673 Personal history of transient ischemic attack (TIA), and cerebral infarction without residual deficits: Secondary | ICD-10-CM | POA: Insufficient documentation

## 2012-12-15 DIAGNOSIS — R5383 Other fatigue: Secondary | ICD-10-CM | POA: Insufficient documentation

## 2012-12-15 DIAGNOSIS — Z8739 Personal history of other diseases of the musculoskeletal system and connective tissue: Secondary | ICD-10-CM | POA: Insufficient documentation

## 2012-12-15 DIAGNOSIS — R5381 Other malaise: Secondary | ICD-10-CM | POA: Insufficient documentation

## 2012-12-15 DIAGNOSIS — F17200 Nicotine dependence, unspecified, uncomplicated: Secondary | ICD-10-CM | POA: Insufficient documentation

## 2012-12-15 DIAGNOSIS — Z7982 Long term (current) use of aspirin: Secondary | ICD-10-CM | POA: Insufficient documentation

## 2012-12-15 DIAGNOSIS — Z79899 Other long term (current) drug therapy: Secondary | ICD-10-CM | POA: Insufficient documentation

## 2012-12-15 DIAGNOSIS — Z7902 Long term (current) use of antithrombotics/antiplatelets: Secondary | ICD-10-CM | POA: Insufficient documentation

## 2012-12-15 DIAGNOSIS — I10 Essential (primary) hypertension: Secondary | ICD-10-CM | POA: Insufficient documentation

## 2012-12-15 DIAGNOSIS — E119 Type 2 diabetes mellitus without complications: Secondary | ICD-10-CM | POA: Insufficient documentation

## 2012-12-15 DIAGNOSIS — M25559 Pain in unspecified hip: Secondary | ICD-10-CM | POA: Insufficient documentation

## 2012-12-15 NOTE — ED Notes (Signed)
Rt. Hip hurts. Hx. Of arthritis. Old strokes - chronic weakness on lt.. Side. Onset: 12/15/12 early evening 10 pm. Whole lt. Side lock ing up.

## 2012-12-16 ENCOUNTER — Emergency Department (HOSPITAL_COMMUNITY): Payer: Medicare Other

## 2012-12-16 MED ORDER — ONDANSETRON 4 MG PO TBDP
8.0000 mg | ORAL_TABLET | Freq: Once | ORAL | Status: AC
  Administered 2012-12-16: 8 mg via ORAL
  Filled 2012-12-16: qty 2

## 2012-12-16 MED ORDER — HYDROMORPHONE HCL PF 1 MG/ML IJ SOLN
1.0000 mg | Freq: Once | INTRAMUSCULAR | Status: AC
  Administered 2012-12-16: 1 mg via INTRAMUSCULAR
  Filled 2012-12-16: qty 1

## 2012-12-16 MED ORDER — TRAMADOL HCL 50 MG PO TABS: 50.0000 mg | ORAL_TABLET | Freq: Four times a day (QID) | ORAL | Status: DC | PRN

## 2012-12-16 NOTE — ED Notes (Signed)
Pt dc to home.  Pt states understanding to dc instructions.  Pt taken to exit by wheelchair.  Nad.

## 2012-12-16 NOTE — ED Provider Notes (Signed)
History     CSN: [id number]  Arrival date & time 12/15/12  2347   First MD Initiated Contact with Patient 12/16/12 0002      Chief Complaint  Patient presents with  . Hip Pain  . Weakness    (Consider location/radiation/quality/duration/timing/severity/associated sxs/prior treatment) HPI History provided by patient. Right hip pain ongoing for some time the worse tonight. Worse with walking. No fall or trauma. Has remote history of stroke but no new weakness or numbness. Pain is sharp in quality radiates from her hip to her thigh. Symptoms moderate in severity. Has not taken any medications for this at home. [name] became concerned when she's having trouble walking and brings her in for evaluation. Past Medical History  Diagnosis Date  . Stroke   . Diabetes mellitus   . Hypertension     History reviewed. No pertinent past surgical history.  No family history on file.  History  Substance Use Topics  . Smoking status: Current Every Day Smoker  . Smokeless tobacco: Not on file  . Alcohol Use: No    OB History    Grav Para Term Preterm Abortions TAB SAB Ect Mult Living                  Review of Systems  Constitutional: Negative for fever and chills.  HENT: Negative for neck pain and neck stiffness.   Eyes: Negative for pain.  Respiratory: Negative for shortness of breath.   Cardiovascular: Negative for chest pain.  Gastrointestinal: Negative for abdominal pain.  Genitourinary: Negative for dysuria.  Musculoskeletal: Negative for back pain.  Skin: Negative for rash.  Neurological: Negative for headaches.  All other systems reviewed and are negative.    Allergies  Penicillins  Home Medications   Current Outpatient Rx  Name  Route  Sig  Dispense  Refill  . ASPIRIN 81 MG PO TABS   Oral   Take 81 mg by mouth daily.          . ATENOLOL 25 MG PO TABS   Oral   Take 25 mg by mouth daily.         Marland Kitchen CLOPIDOGREL BISULFATE 75 MG PO TABS   Oral   Take 75  mg by mouth daily.         . CYCLOBENZAPRINE HCL 10 MG PO TABS   Oral   Take 10 mg by mouth 3 (three) times daily as needed. muscle         . LISINOPRIL 5 MG PO TABS   Oral   Take 5 mg by mouth daily.         Marland Kitchen METFORMIN HCL 500 MG PO TABS   Oral   Take 500 mg by mouth 2 (two) times daily with a meal.         . OMEPRAZOLE 20 MG PO CPDR   Oral   Take 20 mg by mouth daily.           BP 134/59  Pulse 63  Temp 98.1 F (36.7 C) (Oral)  Resp 20  SpO2 100%  Physical Exam  Constitutional: She is oriented to person, place, and time. She appears well-developed and well-nourished.  HENT:  Head: Normocephalic and atraumatic.  Eyes: EOM are normal. Pupils are equal, round, and reactive to light.  Neck: Neck supple.  Cardiovascular: Regular rhythm and intact distal pulses.   Pulmonary/Chest: Effort normal. No respiratory distress.  Musculoskeletal: She exhibits no edema.       Tender  over right greater trochanter without erythema or deformity. Good range of motion at the hip and distal neurovascular intact. Pelvis is stable and knee and ankle are nontender  Neurological: She is alert and oriented to person, place, and time.  Skin: Skin is warm and dry.    ED Course  Procedures (including critical care time)  Labs Reviewed - No data to display Dg Hip Complete Right  12/16/2012  *RADIOLOGY REPORT*  Clinical Data: Right hip pain.  No injury.  RIGHT HIP - COMPLETE 2+ VIEW  Comparison: 06/13/2011.  Findings: No fracture or dislocation.  No significant hip joint space narrowing.  No plain film evidence of femoral head avascular necrosis.  Minimal degenerative changes lower lumbar spine.  IMPRESSION: No plain film evidence of significant hip joint degenerative changes or femoral head avascular necrosis.   Original Report Authenticated By: Lacy Duverney, M.D.    IM Dilaudid provided. Zofran provided for some nausea. After medications patient able to ambulate with minimal  assistance.   MDM   Right hip pain improved with medications. X-ray obtained and reviewed as above. Patient has no back pain or tenderness. Plan prescription for Ultram and followup with primary care physician. Vital signs and nursing notes reviewed.        Sunnie Nielsen, MD 12/16/12 [phone]

## 2013-06-12 ENCOUNTER — Other Ambulatory Visit: Payer: Self-pay | Admitting: Internal Medicine

## 2013-06-12 DIAGNOSIS — E2839 Other primary ovarian failure: Secondary | ICD-10-CM

## 2013-08-15 ENCOUNTER — Encounter (HOSPITAL_COMMUNITY): Payer: Self-pay | Admitting: *Deleted

## 2013-08-15 ENCOUNTER — Emergency Department (HOSPITAL_COMMUNITY)
Admission: EM | Admit: 2013-08-15 | Discharge: 2013-08-16 | Disposition: A | Discharge status: Home / Self Care | Payer: Medicare Other | Attending: Emergency Medicine | Admitting: Emergency Medicine

## 2013-08-15 DIAGNOSIS — F17200 Nicotine dependence, unspecified, uncomplicated: Secondary | ICD-10-CM | POA: Insufficient documentation

## 2013-08-15 DIAGNOSIS — R112 Nausea with vomiting, unspecified: Secondary | ICD-10-CM

## 2013-08-15 DIAGNOSIS — Z880 Allergy status to penicillin: Secondary | ICD-10-CM | POA: Insufficient documentation

## 2013-08-15 DIAGNOSIS — Z8673 Personal history of transient ischemic attack (TIA), and cerebral infarction without residual deficits: Secondary | ICD-10-CM | POA: Insufficient documentation

## 2013-08-15 DIAGNOSIS — R05 Cough: Secondary | ICD-10-CM | POA: Insufficient documentation

## 2013-08-15 DIAGNOSIS — Z7982 Long term (current) use of aspirin: Secondary | ICD-10-CM | POA: Insufficient documentation

## 2013-08-15 DIAGNOSIS — Z79899 Other long term (current) drug therapy: Secondary | ICD-10-CM | POA: Insufficient documentation

## 2013-08-15 DIAGNOSIS — R059 Cough, unspecified: Secondary | ICD-10-CM | POA: Insufficient documentation

## 2013-08-15 DIAGNOSIS — I10 Essential (primary) hypertension: Secondary | ICD-10-CM | POA: Insufficient documentation

## 2013-08-15 DIAGNOSIS — E663 Overweight: Secondary | ICD-10-CM | POA: Insufficient documentation

## 2013-08-15 DIAGNOSIS — E119 Type 2 diabetes mellitus without complications: Secondary | ICD-10-CM | POA: Insufficient documentation

## 2013-08-15 MED ORDER — SODIUM CHLORIDE 0.9 % IV SOLN
1000.0000 mL | Freq: Once | INTRAVENOUS | Status: AC
  Administered 2013-08-16: 1000 mL via INTRAVENOUS

## 2013-08-15 MED ORDER — ONDANSETRON HCL 4 MG/2ML IJ SOLN
4.0000 mg | Freq: Once | INTRAMUSCULAR | Status: AC
  Administered 2013-08-16: 4 mg via INTRAVENOUS
  Filled 2013-08-15: qty 2

## 2013-08-15 MED ORDER — SODIUM CHLORIDE 0.9 % IV SOLN: 1000.0000 mL | INTRAVENOUS | Status: DC

## 2013-08-15 NOTE — ED Provider Notes (Signed)
CSN: [id number]     Arrival date & time 08/15/13  2206 History   First MD Initiated Contact with Patient 08/15/13 2344     Chief Complaint  Patient presents with  . Emesis    HPI Comments: Pt did not want to come here but her family brought her in tonight.  NO alcohol use.    Patient is a 67 y.o. female presenting with vomiting.  Emesis Severity:  Moderate Duration:  2 weeks Timing:  Intermittent Number of daily episodes:  She has been vomiting once or twice in the morning Quality:  Stomach contents Able to tolerate:  Solids Progression:  Unchanged Recent urination:  Normal Context: not post-tussive and not self-induced   Relieved by:  Nothing Worsened by:  Nothing tried Ineffective treatments:  None tried Associated symptoms: cough   Associated symptoms: no abdominal pain, no diarrhea, no fever, no headaches, no sore throat and no URI     Past Medical History  Diagnosis Date  . Stroke   . Diabetes mellitus   . Hypertension    History reviewed. No pertinent past surgical history. History reviewed. No pertinent family history. History  Substance Use Topics  . Smoking status: Current Every Day Smoker  . Smokeless tobacco: Not on file  . Alcohol Use: No   OB History   Grav Para Term Preterm Abortions TAB SAB Ect Mult Living                 Review of Systems  Constitutional: Negative for fever.  HENT: Negative for sore throat and neck pain.   Eyes: Negative for discharge.  Respiratory: Positive for cough.   Cardiovascular: Negative for chest pain.  Gastrointestinal: Positive for vomiting. Negative for abdominal pain and diarrhea.  Endocrine: Negative for polyuria.  Genitourinary: Negative for dysuria.  Musculoskeletal: Negative for joint swelling.  Skin: Negative for rash.  Neurological: Negative for headaches.  Hematological: Does not bruise/bleed easily.  Psychiatric/Behavioral: The patient is not nervous/anxious.   All other systems reviewed and are  negative.    Allergies  Penicillins  Home Medications   Current Outpatient Rx  Name  Route  Sig  Dispense  Refill  . acetaminophen (TYLENOL) 325 MG tablet   Oral   Take 650 mg by mouth every 6 (six) hours as needed for pain.         Marland Kitchen aspirin 81 MG tablet   Oral   Take 81 mg by mouth daily.          . clopidogrel (PLAVIX) 75 MG tablet   Oral   Take 75 mg by mouth daily.         . cyclobenzaprine (FLEXERIL) 10 MG tablet   Oral   Take 10 mg by mouth 3 (three) times daily as needed. muscle         . lisinopril (PRINIVIL,ZESTRIL) 5 MG tablet   Oral   Take 5 mg by mouth daily.         . metFORMIN (GLUCOPHAGE) 500 MG tablet   Oral   Take 500 mg by mouth 2 (two) times daily with a meal.         . traMADol (ULTRAM) 50 MG tablet   Oral   Take 1 tablet (50 mg total) by mouth every 6 (six) hours as needed for pain.   15 tablet   0   . atenolol (TENORMIN) 25 MG tablet   Oral   Take 1 tablet (25 mg total) by mouth daily.  30 tablet   0   . omeprazole (PRILOSEC) 20 MG capsule   Oral   Take 1 capsule (20 mg total) by mouth daily.   30 capsule   0   . ondansetron (ZOFRAN ODT) 8 MG disintegrating tablet   Oral   Take 1 tablet (8 mg total) by mouth every 8 (eight) hours as needed for nausea.   20 tablet   0    BP 151/64  Pulse 65  Temp(Src) 98.4 F (36.9 C) (Oral)  Resp 20  SpO2 100% Physical Exam  Nursing note and vitals reviewed. Constitutional: She appears well-developed and well-nourished. No distress.  Overweight   HENT:  Head: Normocephalic and atraumatic.  Right Ear: External ear normal.  Left Ear: External ear normal.  Eyes: Conjunctivae are normal. Right eye exhibits no discharge. Left eye exhibits no discharge. No scleral icterus.  Neck: Neck supple. No tracheal deviation present.  Cardiovascular: Normal rate, regular rhythm and intact distal pulses.   Pulmonary/Chest: Effort normal and breath sounds normal. No stridor. No respiratory  distress. She has no wheezes. She has no rales.  Abdominal: Soft. Bowel sounds are normal. She exhibits no distension. There is no tenderness. There is no rebound and no guarding.  Musculoskeletal: She exhibits no edema and no tenderness.  Neurological: She is alert. She has normal strength. No sensory deficit. Cranial nerve deficit:  no gross defecits noted. She exhibits normal muscle tone. She displays no seizure activity. Coordination normal.  Skin: Skin is warm and dry. No rash noted.  Psychiatric: She has a normal mood and affect.    ED Course  Procedures (including critical care time) Labs Review Labs Reviewed  COMPREHENSIVE METABOLIC PANEL - Abnormal; Notable for the following:    Glucose, Bld 136 (*)    GFR calc non Af Amer 69 (*)    GFR calc Af Amer 80 (*)    All other components within normal limits  URINALYSIS, ROUTINE W REFLEX MICROSCOPIC - Abnormal; Notable for the following:    APPearance HAZY (*)    Bilirubin Urine SMALL (*)    Leukocytes, UA TRACE (*)    All other components within normal limits  CBC WITH DIFFERENTIAL - Abnormal; Notable for the following:    WBC 10.9 (*)    RBC 5.61 (*)    Hemoglobin 15.7 (*)    HCT 47.3 (*)    All other components within normal limits  URINE MICROSCOPIC-ADD ON - Abnormal; Notable for the following:    Squamous Epithelial / LPF FEW (*)    Bacteria, UA FEW (*)    All other components within normal limits  URINE CULTURE  LIPASE, BLOOD   Imaging Review No results found.  MDM   1. Nausea and vomiting     Patient's laboratory tests are reassuring. She has no abdominal tenderness on exam. She has been having recurrent issues with nausea and some vomiting. Patient are as plans to see a gastroenterologist she says on October 1. I will discharge her home on a prescription for antinausea medications. She started taking Prilosec.  At this time there does not appear to be any evidence of an acute emergency medical condition and the  patient appears stable for discharge with appropriate outpatient follow up.     Celene Kras, MD 08/17/13 [phone]

## 2013-08-15 NOTE — ED Notes (Addendum)
Pt family states that pt has been vomiting twice in the morning and twice at night for the past 2 weeks. Pt denies pain and states that right now she is not nauseated. Pt family unsure of reason for vomiting and states pt had vertigo years ago. Pt has not had BP medication for 2 nights.

## 2013-08-16 LAB — URINALYSIS, ROUTINE W REFLEX MICROSCOPIC
Glucose, UA: NEGATIVE mg/dL
Hgb urine dipstick: NEGATIVE
Protein, ur: NEGATIVE mg/dL
Specific Gravity, Urine: 1.027 (ref 1.005–1.030)
Urobilinogen, UA: 1 mg/dL (ref 0.0–1.0)

## 2013-08-16 LAB — CBC WITH DIFFERENTIAL/PLATELET
Eosinophils Absolute: 0.1 10*3/uL (ref 0.0–0.7)
Eosinophils Relative: 1 % (ref 0–5)
HCT: 47.3 % — ABNORMAL HIGH (ref 36.0–46.0)
Lymphs Abs: 3 10*3/uL (ref 0.7–4.0)
MCH: 28 pg (ref 26.0–34.0)
MCV: 84.3 fL (ref 78.0–100.0)
Monocytes Absolute: 0.6 10*3/uL (ref 0.1–1.0)
Monocytes Relative: 6 % (ref 3–12)
Platelets: 299 10*3/uL (ref 150–400)
RBC: 5.61 MIL/uL — ABNORMAL HIGH (ref 3.87–5.11)

## 2013-08-16 LAB — COMPREHENSIVE METABOLIC PANEL
ALT: 9 U/L (ref 0–35)
Alkaline Phosphatase: 61 U/L (ref 39–117)
CO2: 26 mEq/L (ref 19–32)
GFR calc Af Amer: 80 mL/min — ABNORMAL LOW (ref >90)
GFR calc non Af Amer: 69 mL/min — ABNORMAL LOW (ref >90)
Glucose, Bld: 136 mg/dL — ABNORMAL HIGH (ref 70–99)
Potassium: 4 mEq/L (ref 3.5–5.1)
Sodium: 143 mEq/L (ref 135–145)

## 2013-08-16 LAB — URINE MICROSCOPIC-ADD ON

## 2013-08-16 MED ORDER — ONDANSETRON 8 MG PO TBDP
8.0000 mg | ORAL_TABLET | Freq: Three times a day (TID) | ORAL | Status: DC | PRN
Start: 2013-08-16 — End: 2014-07-08

## 2013-08-16 MED ORDER — OMEPRAZOLE 20 MG PO CPDR: 20.0000 mg | DELAYED_RELEASE_CAPSULE | Freq: Every day | ORAL | Status: DC

## 2013-08-16 MED ORDER — ATENOLOL 25 MG PO TABS: 25.0000 mg | ORAL_TABLET | Freq: Every day | ORAL | Status: DC

## 2013-08-16 NOTE — ED Notes (Signed)
Multiple IV starts attempted.  IV team paged. 

## 2013-08-17 LAB — URINE CULTURE: Colony Count: 9000

## 2013-09-05 ENCOUNTER — Other Ambulatory Visit: Payer: Self-pay | Admitting: Gastroenterology

## 2014-01-23 ENCOUNTER — Other Ambulatory Visit: Payer: Medicare Other

## 2014-03-26 ENCOUNTER — Other Ambulatory Visit: Payer: Medicare Other

## 2014-03-27 ENCOUNTER — Other Ambulatory Visit: Payer: Medicare Other

## 2014-06-13 ENCOUNTER — Encounter (HOSPITAL_COMMUNITY): Payer: Self-pay | Admitting: Emergency Medicine

## 2014-06-13 ENCOUNTER — Emergency Department (INDEPENDENT_AMBULATORY_CARE_PROVIDER_SITE_OTHER)
Admission: EM | Admit: 2014-06-13 | Discharge: 2014-06-13 | Disposition: A | Discharge status: Home / Self Care | Payer: Medicare Other | Source: Home / Self Care | Attending: Emergency Medicine | Admitting: Emergency Medicine

## 2014-06-13 DIAGNOSIS — I6350 Cerebral infarction due to unspecified occlusion or stenosis of unspecified cerebral artery: Secondary | ICD-10-CM

## 2014-06-13 DIAGNOSIS — B354 Tinea corporis: Secondary | ICD-10-CM

## 2014-06-13 MED ORDER — CLOTRIMAZOLE-BETAMETHASONE 1-0.05 % EX CREA: TOPICAL_CREAM | CUTANEOUS | Status: DC

## 2014-06-13 NOTE — ED Provider Notes (Signed)
CSN: [id number]     Arrival date & time 06/13/14  1478 History   First MD Initiated Contact with Patient 06/13/14 1000     Chief Complaint  Patient presents with  . Rash   (Consider location/radiation/quality/duration/timing/severity/associated sxs/prior Treatment) Patient is a 68 y.o. female presenting with rash. The history is provided by the patient.  Rash Location:  Head/neck Head/neck rash location:  L neck and R neck Quality: itchiness and redness   Severity:  Mild Onset quality:  Gradual Duration:  1 week Timing:  Constant Progression:  Unchanged Chronicity:  New Associated symptoms: no fever     Past Medical History  Diagnosis Date  . Stroke   . Diabetes mellitus   . Hypertension    History reviewed. No pertinent past surgical history. History reviewed. No pertinent family history. History  Substance Use Topics  . Smoking status: Current Every Day Smoker  . Smokeless tobacco: Not on file  . Alcohol Use: No   OB History   Grav Para Term Preterm Abortions TAB SAB Ect Mult Living                 Review of Systems  Constitutional: Negative for fever.  Skin: Positive for rash.  All other systems reviewed and are negative.   Allergies  Penicillins  Home Medications   Prior to Admission medications   Medication Sig Start Date End Date Taking? Authorizing Provider  acetaminophen (TYLENOL) 325 MG tablet Take 650 mg by mouth every 6 (six) hours as needed for pain.    Historical Provider, MD  aspirin 81 MG tablet Take 81 mg by mouth daily.     Historical Provider, MD  atenolol (TENORMIN) 25 MG tablet Take 1 tablet (25 mg total) by mouth daily. 08/16/13   Linwood Dibbles, MD  clopidogrel (PLAVIX) 75 MG tablet Take 75 mg by mouth daily.    Historical Provider, MD  clotrimazole-betamethasone (LOTRISONE) cream Apply to affected area 2 times daily x 10 days 06/13/14   Ardis Rowan, PA  cyclobenzaprine (FLEXERIL) 10 MG tablet Take 10 mg by mouth 3 (three) times daily  as needed. muscle 09/11/12   Historical Provider, MD  lisinopril (PRINIVIL,ZESTRIL) 5 MG tablet Take 5 mg by mouth daily.    Historical Provider, MD  metFORMIN (GLUCOPHAGE) 500 MG tablet Take 500 mg by mouth 2 (two) times daily with a meal.    Historical Provider, MD  omeprazole (PRILOSEC) 20 MG capsule Take 1 capsule (20 mg total) by mouth daily. 08/16/13   Linwood Dibbles, MD  ondansetron (ZOFRAN ODT) 8 MG disintegrating tablet Take 1 tablet (8 mg total) by mouth every 8 (eight) hours as needed for nausea. 08/16/13   Linwood Dibbles, MD  traMADol (ULTRAM) 50 MG tablet Take 1 tablet (50 mg total) by mouth every 6 (six) hours as needed for pain. 12/16/12   Sunnie Nielsen, MD   BP 144/88  Pulse 69  Temp(Src) 98.6 F (37 C) (Oral)  Resp 16  SpO2 99% Physical Exam  Nursing note and vitals reviewed. Constitutional: She is oriented to person, place, and time. She appears well-developed and well-nourished. No distress.  HENT:  Head: Normocephalic and atraumatic.  Cardiovascular: Normal rate.   Pulmonary/Chest: Effort normal.  Musculoskeletal: Normal range of motion.  Neurological: She is alert and oriented to person, place, and time.  Skin: Skin is warm and dry. Rash noted.  Dry macular slightly erythematous patchy rash around base of neck with fine grey/white scale.   Psychiatric: She has  a normal mood and affect. Her behavior is normal.    ED Course  Procedures (including critical care time) Labs Review Labs Reviewed - No data to display  Imaging Review No results found.   MDM   1. Tinea corporis    Lotrisone as directed with PCP follow up if no improvement.    Jess BartersJennifer Lee Delaware[place]on, [place] 06/13/14 1034

## 2014-06-13 NOTE — ED Provider Notes (Signed)
Medical screening examination/treatment/procedure(s) were performed by resident physician or non-physician practitioner and as supervising physician I was immediately available for consultation/collaboration.  Erin Honig, MD     Erin J Honig, MD 06/13/14 1303 

## 2014-06-13 NOTE — ED Notes (Signed)
C/o rash around collar bone of patients neck States rash does itch States no new products used  Did used corn starch as tx

## 2014-07-08 ENCOUNTER — Encounter (HOSPITAL_COMMUNITY): Payer: Self-pay | Admitting: Emergency Medicine

## 2014-07-08 ENCOUNTER — Observation Stay (HOSPITAL_COMMUNITY)
Admission: EM | Admit: 2014-07-08 | Discharge: 2014-07-11 | Disposition: A | Discharge status: Home / Self Care | Payer: Medicare Other | Attending: Internal Medicine | Admitting: Internal Medicine

## 2014-07-08 ENCOUNTER — Observation Stay (HOSPITAL_COMMUNITY): Payer: Medicare Other

## 2014-07-08 ENCOUNTER — Emergency Department (HOSPITAL_COMMUNITY): Payer: Medicare Other

## 2014-07-08 DIAGNOSIS — R269 Unspecified abnormalities of gait and mobility: Secondary | ICD-10-CM | POA: Insufficient documentation

## 2014-07-08 DIAGNOSIS — I6200 Nontraumatic subdural hemorrhage, unspecified: Secondary | ICD-10-CM

## 2014-07-08 DIAGNOSIS — F17200 Nicotine dependence, unspecified, uncomplicated: Secondary | ICD-10-CM | POA: Insufficient documentation

## 2014-07-08 DIAGNOSIS — E119 Type 2 diabetes mellitus without complications: Secondary | ICD-10-CM | POA: Diagnosis not present

## 2014-07-08 DIAGNOSIS — Z8673 Personal history of transient ischemic attack (TIA), and cerebral infarction without residual deficits: Secondary | ICD-10-CM

## 2014-07-08 DIAGNOSIS — I639 Cerebral infarction, unspecified: Secondary | ICD-10-CM

## 2014-07-08 DIAGNOSIS — I69993 Ataxia following unspecified cerebrovascular disease: Secondary | ICD-10-CM | POA: Insufficient documentation

## 2014-07-08 DIAGNOSIS — I6350 Cerebral infarction due to unspecified occlusion or stenosis of unspecified cerebral artery: Principal | ICD-10-CM | POA: Insufficient documentation

## 2014-07-08 DIAGNOSIS — I10 Essential (primary) hypertension: Secondary | ICD-10-CM | POA: Diagnosis not present

## 2014-07-08 DIAGNOSIS — M6281 Muscle weakness (generalized): Secondary | ICD-10-CM | POA: Insufficient documentation

## 2014-07-08 DIAGNOSIS — R29810 Facial weakness: Secondary | ICD-10-CM

## 2014-07-08 DIAGNOSIS — Z8679 Personal history of other diseases of the circulatory system: Secondary | ICD-10-CM

## 2014-07-08 DIAGNOSIS — R29898 Other symptoms and signs involving the musculoskeletal system: Secondary | ICD-10-CM

## 2014-07-08 DIAGNOSIS — E118 Type 2 diabetes mellitus with unspecified complications: Secondary | ICD-10-CM

## 2014-07-08 HISTORY — DX: Other symptoms and signs involving the musculoskeletal system: R29.898

## 2014-07-08 HISTORY — DX: Nontraumatic subdural hemorrhage, unspecified: I62.00

## 2014-07-08 HISTORY — DX: Personal history of transient ischemic attack (TIA), and cerebral infarction without residual deficits: Z86.73

## 2014-07-08 HISTORY — DX: Facial weakness: R29.810

## 2014-07-08 LAB — COMPREHENSIVE METABOLIC PANEL
ALT: 13 U/L (ref 0–35)
AST: 15 U/L (ref 0–37)
Albumin: 3.6 g/dL (ref 3.5–5.2)
Alkaline Phosphatase: 81 U/L (ref 39–117)
Anion gap: 12 (ref 5–15)
BUN: 12 mg/dL (ref 6–23)
CO2: 26 meq/L (ref 19–32)
Calcium: 9.2 mg/dL (ref 8.4–10.5)
Chloride: 105 mEq/L (ref 96–112)
Creatinine, Ser: 1.1 mg/dL (ref 0.50–1.10)
GFR calc Af Amer: 59 mL/min — ABNORMAL LOW (ref >90)
GFR, EST NON AFRICAN AMERICAN: 51 mL/min — AB (ref >90)
GLUCOSE: 164 mg/dL — AB (ref 70–99)
POTASSIUM: 4.5 meq/L (ref 3.7–5.3)
SODIUM: 143 meq/L (ref 137–147)
Total Bilirubin: 0.4 mg/dL (ref 0.3–1.2)
Total Protein: 6.8 g/dL (ref 6.0–8.3)

## 2014-07-08 LAB — CBG MONITORING, ED: GLUCOSE-CAPILLARY: 164 mg/dL — AB (ref 70–99)

## 2014-07-08 LAB — URINALYSIS, ROUTINE W REFLEX MICROSCOPIC
BILIRUBIN URINE: NEGATIVE
GLUCOSE, UA: NEGATIVE mg/dL
HGB URINE DIPSTICK: NEGATIVE
Ketones, ur: NEGATIVE mg/dL
Leukocytes, UA: NEGATIVE
Nitrite: NEGATIVE
PROTEIN: NEGATIVE mg/dL
Specific Gravity, Urine: 1.022 (ref 1.005–1.030)
UROBILINOGEN UA: 1 mg/dL (ref 0.0–1.0)
pH: 6.5 (ref 5.0–8.0)

## 2014-07-08 LAB — I-STAT TROPONIN, ED: Troponin i, poc: 0 ng/mL (ref 0.00–0.08)

## 2014-07-08 LAB — CBC
HCT: 39.4 % (ref 36.0–46.0)
Hemoglobin: 13 g/dL (ref 12.0–15.0)
MCH: 27.8 pg (ref 26.0–34.0)
MCHC: 33 g/dL (ref 30.0–36.0)
MCV: 84.4 fL (ref 78.0–100.0)
PLATELETS: 213 10*3/uL (ref 150–400)
RBC: 4.67 MIL/uL (ref 3.87–5.11)
RDW: 14.5 % (ref 11.5–15.5)
WBC: 9 10*3/uL (ref 4.0–10.5)

## 2014-07-08 MED ORDER — HYDRALAZINE HCL 20 MG/ML IJ SOLN: 10.0000 mg | INTRAMUSCULAR | Status: DC | PRN

## 2014-07-08 MED ORDER — INSULIN ASPART 100 UNIT/ML ~~LOC~~ SOLN
0.0000 [IU] | Freq: Every day | SUBCUTANEOUS | Status: DC
Start: 2014-07-08 — End: 2014-07-11
  Administered 2014-07-09: 2 [IU] via SUBCUTANEOUS

## 2014-07-08 MED ORDER — HEPARIN SODIUM (PORCINE) 5000 UNIT/ML IJ SOLN
5000.0000 [IU] | Freq: Three times a day (TID) | INTRAMUSCULAR | Status: DC
  Administered 2014-07-09 – 2014-07-11 (×7): 5000 [IU] via SUBCUTANEOUS
  Filled 2014-07-08 (×12): qty 1

## 2014-07-08 MED ORDER — ATENOLOL 25 MG PO TABS
25.0000 mg | ORAL_TABLET | Freq: Every day | ORAL | Status: DC
  Administered 2014-07-09 – 2014-07-11 (×3): 25 mg via ORAL
  Filled 2014-07-08 (×4): qty 1

## 2014-07-08 MED ORDER — ACETAMINOPHEN 325 MG PO TABS: 650.0000 mg | ORAL_TABLET | ORAL | Status: DC | PRN

## 2014-07-08 MED ORDER — ASPIRIN 325 MG PO TABS
325.0000 mg | ORAL_TABLET | Freq: Every day | ORAL | Status: DC
  Administered 2014-07-09 – 2014-07-11 (×3): 325 mg via ORAL
  Filled 2014-07-08 (×3): qty 1

## 2014-07-08 MED ORDER — STROKE: EARLY STAGES OF RECOVERY BOOK
Freq: Once | Status: DC
  Filled 2014-07-08: qty 1

## 2014-07-08 MED ORDER — INSULIN ASPART 100 UNIT/ML ~~LOC~~ SOLN
0.0000 [IU] | Freq: Three times a day (TID) | SUBCUTANEOUS | Status: DC
Start: 2014-07-09 — End: 2014-07-11
  Administered 2014-07-09 (×2): 2 [IU] via SUBCUTANEOUS
  Administered 2014-07-09 – 2014-07-10 (×2): 3 [IU] via SUBCUTANEOUS
  Administered 2014-07-10: 5 [IU] via SUBCUTANEOUS
  Administered 2014-07-10 – 2014-07-11 (×3): 3 [IU] via SUBCUTANEOUS

## 2014-07-08 MED ORDER — LISINOPRIL 5 MG PO TABS
5.0000 mg | ORAL_TABLET | Freq: Every day | ORAL | Status: DC
  Administered 2014-07-09 – 2014-07-10 (×2): 5 mg via ORAL
  Filled 2014-07-08 (×4): qty 1

## 2014-07-08 MED ORDER — PANTOPRAZOLE SODIUM 40 MG PO TBEC
40.0000 mg | DELAYED_RELEASE_TABLET | Freq: Every day | ORAL | Status: DC
Start: 2014-07-08 — End: 2014-07-09

## 2014-07-08 MED ORDER — CLOPIDOGREL BISULFATE 75 MG PO TABS
75.0000 mg | ORAL_TABLET | Freq: Every day | ORAL | Status: DC
  Administered 2014-07-09 – 2014-07-11 (×3): 75 mg via ORAL
  Filled 2014-07-08 (×4): qty 1

## 2014-07-08 NOTE — H&P (Signed)
[facility] History and Physical  Patient: [name]  UEA:[id number]  DOB: [birth year]  DOS: the patient was seen and examined on 07/08/2014 PCP: Dorrene GermanAVBUERE,EDWIN A, MD  Chief Complaint: Left-sided weakness  HPI: [name] is a 68 y.o. female with Past medical history of CVA, subdural hemorrhage, diabetes mellitus, hypertension, possible dementia. The patient presented with complaints of left-sided weakness. The history was obtained from patient as well as her daughter. Patient has been having complaint of pain in her left foot. She describes pain as a continuous dull ache. Pain she describes is primarily in her plantar surface and in bone. Since last 2 weeks she has been seen dragging her feet. There has been recently worsening confusion as well. No fall no trauma no injury. No headache. No blurring of vision. No chest pain no shortness of breath no cough. No nausea no vomiting no abdominal pain. No constipation. No burning urination but patient has occasional spotting of blood on her underwear.  The patient is coming from home. And at her baseline independent for most of her ADL.  Review of Systems: as mentioned in the history of present illness.  A Comprehensive review of the other systems is negative.  Past Medical History  Diagnosis Date  . Stroke   . Diabetes mellitus   . Hypertension    Past Surgical History  Procedure Laterality Date  . No past surgeries     Social History:  reports that she has been smoking Cigarettes.  She has a 48 pack-year smoking history. She has never used smokeless tobacco. She reports that she does not drink alcohol or use illicit drugs.  Allergies  Allergen Reactions  . Penicillins Itching    History reviewed. No pertinent family history.  Prior to Admission medications   Medication Sig Start Date End Date Taking? Authorizing Provider  acetaminophen (TYLENOL) 325 MG tablet Take 650 mg by mouth every 6 (six) hours as  needed for pain.   Yes Historical Provider, MD  aspirin 81 MG tablet Take 81 mg by mouth daily.    Yes Historical Provider, MD  atenolol (TENORMIN) 25 MG tablet Take 1 tablet (25 mg total) by mouth daily. 08/16/13  Yes Linwood DibblesJon Knapp, MD  clopidogrel (PLAVIX) 75 MG tablet Take 75 mg by mouth daily.   Yes Historical Provider, MD  lisinopril (PRINIVIL,ZESTRIL) 5 MG tablet Take 5 mg by mouth daily.   Yes Historical Provider, MD  metFORMIN (GLUCOPHAGE) 500 MG tablet Take 500 mg by mouth 2 (two) times daily with a meal.   Yes Historical Provider, MD  omeprazole (PRILOSEC) 20 MG capsule Take 1 capsule (20 mg total) by mouth daily. 08/16/13  Yes Linwood DibblesJon Knapp, MD    Physical Exam: Filed Vitals:   07/08/14 1919 07/08/14 2118 07/08/14 2137 07/08/14 2137  BP: 158/57 168/106    Pulse: 86 87    Temp:  98 F (36.7 C) 98.4 F (36.9 C) 98.4 F (36.9 C)  TempSrc:  Oral    Resp: 16 20    SpO2: 98% 99%      General: Alert, Awake and Oriented to Time, Place and Person. Appear in mild distress Eyes: PERRL ENT: Oral Mucosa clear moist. Neck: No JVD Cardiovascular: S1 and S2 Present, aortic systolic Murmur, Peripheral Pulses Present Respiratory: Bilateral Air entry equal and Decreased, Clear to Auscultation, no Crackles, no wheezes Abdomen: Bowel Sound Present, Soft and Non tender Skin: No Rash Extremities: Trace Pedal edema, no calf tenderness Neurologic:  Mental status AAOx3, speech  some dysarthria which is her baseline per daughter, attention normal,  Cranial Nerves PERRL, EOM normal and present, facial sensation to light touch present limited on the left, Motor strength 4 x 5 in left upper extremity, 5 x 5 in other as well as bilateral lower extremity, Sensation present to light touch but limited on the left, reflexes present knee and biceps, babinski negative, Proprioception normal, Cerebellar test normal finger nose finger.   Labs on Admission:  CBC:  Recent Labs Lab 07/08/14 1956  WBC 9.0  HGB  13.0  HCT 39.4  MCV 84.4  PLT 213    CMP     Component Value Date/Time   NA 143 07/08/2014 1956   K 4.5 07/08/2014 1956   CL 105 07/08/2014 1956   CO2 26 07/08/2014 1956   GLUCOSE 164* 07/08/2014 1956   BUN 12 07/08/2014 1956   CREATININE 1.10 07/08/2014 1956   CALCIUM 9.2 07/08/2014 1956   PROT 6.8 07/08/2014 1956   ALBUMIN 3.6 07/08/2014 1956   AST 15 07/08/2014 1956   ALT 13 07/08/2014 1956   ALKPHOS 81 07/08/2014 1956   BILITOT 0.4 07/08/2014 1956   GFRNONAA 51* 07/08/2014 1956   GFRAA 59* 07/08/2014 1956    No results found for this basename: LIPASE, AMYLASE,  in the last 168 hours No results found for this basename: AMMONIA,  in the last 168 hours  No results found for this basename: CKTOTAL, CKMB, CKMBINDEX, TROPONINI,  in the last 168 hours BNP (last 3 results) No results found for this basename: PROBNP,  in the last 8760 hours  Radiological Exams on Admission: Dg Chest 2 View  07/08/2014   CLINICAL DATA:  Altered mental status and weakness.  EXAM: CHEST  2 VIEW  COMPARISON:  06/13/2011  FINDINGS: The patient is rotated towards the left. There are no focal consolidations or pleural effusions. No pulmonary edema.  IMPRESSION: No active cardiopulmonary disease.   Electronically Signed   By: Rosalie Gums M.D.   On: 07/08/2014 20:23   Ct Head Wo Contrast  07/08/2014   CLINICAL DATA:  Altered mental status. Left-sided weakness. LEFT-sided neurologic deficits. Initial encounter.  EXAM: CT HEAD WITHOUT CONTRAST  TECHNIQUE: Contiguous axial images were obtained from the base of the skull through the vertex without intravenous contrast.  COMPARISON:  06/13/2011.  FINDINGS: No mass lesion, mass effect, midline shift, hydrocephalus, hemorrhage. No acute territorial cortical ischemia/infarct. Atrophy and chronic ischemic white matter disease is present. Chronic LEFT frontal encephalomalacia. Obliquely oriented low attenuation in the RIGHT cerebellar hemisphere is compatible with a prominent  fissure. The calvarium appears intact. Scout images are within normal limits.  IMPRESSION: Atrophy, chronic ischemic white matter disease and LEFT frontal encephalomalacia without acute intracranial abnormality. No interval change.   Electronically Signed   By: Andreas Newport M.D.   On: 07/08/2014 20:33   EKG: Independently reviewed. normal sinus rhythm, nonspecific ST and T waves changes. Assessment/Plan Principal Problem:   TIA (transient ischemic attack) Active Problems:   Facial weakness   Hypertension   H/O: CVA (cerebrovascular accident)   H/O subdural hemorrhage   1. TIA (transient ischemic attack) The patient is presenting with complaints of left-sided weakness and numbness. 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 She was placed on aspirin and Plavix but since last 2 weeks she has out of her medication and is unable to refill them due  to financial issues. From a similar duration she has been having progressively worsening left leg pain and since last one week she has been dragging her left leg when walking in today there was worsening confusion with left leg weakness. On examination reveals equal strength and lower extremity with weakness on the left upper extremity and sensory loss on the left side. CT of the head is negative for any acute abnormality. We would admit the patient to the hospital open MRI brain and MRA of brain. Obtain echocardiogram carotid Doppler. PTOT consultation. Resume her aspirin and Plavix. Neurology will be consulted for the patient. Patient recommended to remain compliant with her medication as well as quit smoking. Permissive hypertension.  2. Hypertension. At present permissive hypertension. Continue her home medication and controlling blood pressure if it is above 180.  3. Diabetes mellitus. Placing the patient on sliding  scale.  4. Spotting on the underwear with blood. Patient's urine at present is clear does not appear to have any abdominal tenderness. We'll continue to monitor her. May require abdominal pelvis ultrasound for further workup.  Consults: Neurology  DVT Prophylaxis: subcutaneous Heparin Nutrition: N.p.o. until stroke evaluation  Code Status: Full  Family Communication: Daughter was present at bedside, opportunity was given to ask question and all questions were answered satisfactorily at the time of interview. Disposition: Admitted to observation in telemetry unit.  Author: Lynden Oxford, MD [facility] Pager: [phone] 07/08/2014, 9:39 PM    If 7PM-7AM, please contact night-coverage [email or web address] Password [email or web address]  **Disclaimer: This note may have been dictated with voice recognition software. Similar sounding words can inadvertently be transcribed and this note may contain transcription errors which may not have been corrected upon publication of note.**

## 2014-07-08 NOTE — ED Notes (Signed)
Patient transported to MRI 

## 2014-07-08 NOTE — ED Notes (Signed)
Bed: [id number] Expected date:  Expected time:  Means of arrival:  Comments: Dressing

## 2014-07-08 NOTE — ED Notes (Signed)
MD at bedside. 

## 2014-07-08 NOTE — ED Notes (Signed)
Family at bedside. 

## 2014-07-08 NOTE — ED Notes (Signed)
Pt is on Cardiac Monitoring

## 2014-07-08 NOTE — ED Provider Notes (Signed)
CSN: [id number]     Arrival date & time 07/08/14  1846 History   First MD Initiated Contact with Patient 07/08/14 1917     Chief Complaint  Patient presents with  . stroke like symptoms       (Consider location/radiation/quality/duration/timing/severity/associated sxs/prior Treatment) HPI Comments: Per daughter, not acting like herself for past 2 days. Off meds due to financial hardships.  Patient is a 68 y.o. female presenting with altered mental status. The history is provided by the patient.  Altered Mental Status Presenting symptoms: behavior changes, lethargy and partial responsiveness   Severity:  Moderate Most recent episode:  2 days ago Episode history:  Continuous Duration:  2 days Timing:  Constant Progression:  Unchanged Chronicity:  New Context: dementia and recent change in medication (off meds for past few days)   Context: not drug use and not a nursing home resident   Associated symptoms: no abdominal pain, no bladder incontinence, no decreased appetite, no fever, no light-headedness, no nausea and no vomiting     Past Medical History  Diagnosis Date  . Stroke   . Diabetes mellitus   . Hypertension    History reviewed. No pertinent past surgical history. No family history on file. History  Substance Use Topics  . Smoking status: Current Every Day Smoker  . Smokeless tobacco: Not on file  . Alcohol Use: No   OB History   Grav Para Term Preterm Abortions TAB SAB Ect Mult Living                 Review of Systems  Constitutional: Negative for fever, chills and decreased appetite.  Cardiovascular: Negative for chest pain and leg swelling.  Gastrointestinal: Negative for nausea, vomiting and abdominal pain.  Genitourinary: Negative for bladder incontinence.  Neurological: Negative for light-headedness.  All other systems reviewed and are negative.     Allergies  Penicillins  Home Medications   Prior to Admission medications   Medication Sig  Start Date End Date Taking? Authorizing Provider  acetaminophen (TYLENOL) 325 MG tablet Take 650 mg by mouth every 6 (six) hours as needed for pain.    Historical Provider, MD  aspirin 81 MG tablet Take 81 mg by mouth daily.     Historical Provider, MD  atenolol (TENORMIN) 25 MG tablet Take 1 tablet (25 mg total) by mouth daily. 08/16/13   Linwood DibblesJon Knapp, MD  clopidogrel (PLAVIX) 75 MG tablet Take 75 mg by mouth daily.    Historical Provider, MD  clotrimazole-betamethasone (LOTRISONE) cream Apply to affected area 2 times daily x 10 days 06/13/14   Ria ClockJennifer Lee H Presson, PA  cyclobenzaprine (FLEXERIL) 10 MG tablet Take 10 mg by mouth 3 (three) times daily as needed. muscle 09/11/12   Historical Provider, MD  lisinopril (PRINIVIL,ZESTRIL) 5 MG tablet Take 5 mg by mouth daily.    Historical Provider, MD  metFORMIN (GLUCOPHAGE) 500 MG tablet Take 500 mg by mouth 2 (two) times daily with a meal.    Historical Provider, MD  omeprazole (PRILOSEC) 20 MG capsule Take 1 capsule (20 mg total) by mouth daily. 08/16/13   Linwood DibblesJon Knapp, MD  ondansetron (ZOFRAN ODT) 8 MG disintegrating tablet Take 1 tablet (8 mg total) by mouth every 8 (eight) hours as needed for nausea. 08/16/13   Linwood DibblesJon Knapp, MD  traMADol (ULTRAM) 50 MG tablet Take 1 tablet (50 mg total) by mouth every 6 (six) hours as needed for pain. 12/16/12   Sunnie NielsenBrian Opitz, MD   BP 158/57  Pulse  86  Temp(Src) 98.9 F (37.2 C) (Oral)  Resp 16  SpO2 98% Physical Exam  Nursing note and vitals reviewed. Constitutional: She appears well-developed and well-nourished. No distress.  HENT:  Head: Normocephalic and atraumatic.  Eyes: EOM are normal. Pupils are equal, round, and reactive to light.  Neck: Normal range of motion. Neck supple.  Cardiovascular: Normal rate and regular rhythm.  Exam reveals no friction rub.   No murmur heard. Pulmonary/Chest: Effort normal and breath sounds normal. No respiratory distress. She has no wheezes. She has no rales.  Abdominal: Soft.  She exhibits no distension. There is no tenderness. There is no rebound.  Musculoskeletal: Normal range of motion. She exhibits no edema.  Neurological: She is alert. A cranial nerve deficit (L sided facial droop) is present. She exhibits abnormal muscle tone (L leg 3/5 weakness). GCS eye subscore is 4. GCS verbal subscore is 2. GCS motor subscore is 6.  Unable to cooperate with sensory exam   Skin: She is not diaphoretic.    ED Course  Procedures (including critical care time) Labs Review Labs Reviewed  CBC  COMPREHENSIVE METABOLIC PANEL  URINALYSIS, ROUTINE W REFLEX MICROSCOPIC  I-STAT TROPOININ, ED    Imaging Review No results found.   EKG Interpretation   Date/Time:  Monday July 08 2014 19:00:33 EDT Ventricular Rate:  85 PR Interval:  141 QRS Duration: 83 QT Interval:  386 QTC Calculation: 459 R Axis:   62 Text Interpretation:  Sinus rhythm Low voltage, precordial leads  Borderline T wave abnormalities Baseline wander in lead(s) aVL Similar to  prior Confirmed by Gwendolyn Grant  MD, Yaret Hush ([id number]) on 07/08/2014 7:27:58 PM      MDM   Final diagnoses:  Stroke  Essential hypertension    40F here with family members state she is not acting like herself. Noticed over the past 2 days decline in her functionality. Mostly high-energy, ambulatory. She is demented and lives with daughter. Has been out of her medications for diabetes, blood pressure, Plavix for the past 2 weeks. Today daughter noticed some left-sided facial droop. Last known normal was roughly 12 hours ago. No onset time within code stroke window. Here vitals are stable. She will follow commands, but does not use the left arm and left leg very well. Left leg is 3/5 strength. Left arm has strength, but it is rigid and she doesn't move it around like her right arm. Normal reflexes. Belly is benign. Lungs are clear. Will do altered mental status workup including head CT, labs, chest x-ray, urine. She does have some  left-sided facial droop some concern for possible stroke. CT head normal. Labs ok. Admitted.  Elwin Mocha, MD 07/09/14 0000

## 2014-07-08 NOTE — ED Notes (Signed)
Patient transported to X-ray 

## 2014-07-08 NOTE — ED Notes (Signed)
Pt's family member states that they were working in the yard and pt was trying to help and they noticed she was limping and not herself. Pt does have dementia and been out of her meds for 2 weeks due to insurance and doctor apts issues.  Pt's smile is weaker on left side. Pt's daughter notices that her legs are more swollen than normal. Pt states that her faces is tingling/numb like.

## 2014-07-09 ENCOUNTER — Encounter (HOSPITAL_COMMUNITY): Payer: Self-pay | Admitting: *Deleted

## 2014-07-09 DIAGNOSIS — E119 Type 2 diabetes mellitus without complications: Secondary | ICD-10-CM

## 2014-07-09 DIAGNOSIS — I059 Rheumatic mitral valve disease, unspecified: Secondary | ICD-10-CM

## 2014-07-09 DIAGNOSIS — I6350 Cerebral infarction due to unspecified occlusion or stenosis of unspecified cerebral artery: Secondary | ICD-10-CM

## 2014-07-09 HISTORY — DX: Type 2 diabetes mellitus without complications: E11.9

## 2014-07-09 LAB — GLUCOSE, CAPILLARY
Glucose-Capillary: 134 mg/dL — ABNORMAL HIGH (ref 70–99)
Glucose-Capillary: 148 mg/dL — ABNORMAL HIGH (ref 70–99)
Glucose-Capillary: 162 mg/dL — ABNORMAL HIGH (ref 70–99)
Glucose-Capillary: 182 mg/dL — ABNORMAL HIGH (ref 70–99)
Glucose-Capillary: 211 mg/dL — ABNORMAL HIGH (ref 70–99)

## 2014-07-09 LAB — AMMONIA: Ammonia: 49 umol/L (ref 11–60)

## 2014-07-09 LAB — HEMOGLOBIN A1C
Hgb A1c MFr Bld: 8 % — ABNORMAL HIGH (ref <5.7)
MEAN PLASMA GLUCOSE: 183 mg/dL — AB (ref <117)

## 2014-07-09 LAB — TSH: TSH: 3.97 u[IU]/mL (ref 0.350–4.500)

## 2014-07-09 LAB — VITAMIN B12: VITAMIN B 12: 251 pg/mL (ref 211–911)

## 2014-07-09 MED ORDER — PANTOPRAZOLE SODIUM 40 MG PO TBEC
40.0000 mg | DELAYED_RELEASE_TABLET | Freq: Every day | ORAL | Status: DC
  Administered 2014-07-09 – 2014-07-11 (×3): 40 mg via ORAL
  Filled 2014-07-09 (×3): qty 1

## 2014-07-09 NOTE — Evaluation (Addendum)
Occupational Therapy Evaluation Patient Details Name: [name] MRN: [id number] DOB: October 09, 1946 Today's Date: 07/09/2014    History of Present Illness [name] is a 68 y.o. female with Past medical history of CVA, subdural hemorrhage, diabetes mellitus, hypertension, possible dementia. Who presented for left foot weakness and leg dragging. Pt with baseline impaired speech from prior CVA   Clinical Impression   Patient admitted with above. Patient independent PTA and living with daughter. Patient currently functioning at a moderate assist level for sit<>stands and functional stand pivot transfers using RW. Patient total assist for LB ADLs at this time. Feel patient will benefit from acute OT to enhance overall performance in BADLs, functional mobility/transfers, overall strengthening & endurance. When patient goes home, plan is for her to again live with her youngest daughter. 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 Recommending CIR for extensive PT/OT in order to increase patient's independence. During OT session, did educate patient and oldest daughter on back precautions as a precautionary measure; awaiting MRI results of spine.     Follow Up Recommendations  CIR;Supervision/Assistance - 24 hour    Equipment Recommendations   (defer to next venue)    Recommendations for Other Services Rehab consult     Precautions / Restrictions Precautions Precautions: Fall Precaution Comments: expressive aphasia Restrictions Weight Bearing Restrictions: No      Mobility Bed Mobility Overal bed mobility: Needs Assistance Bed Mobility: Rolling;Sidelying to Sit Rolling: Supervision Sidelying to sit: Min assist       General bed mobility comments: cues for sequence as pt initially struggling with sequence for bed mobility with heavy reliance on rails but improved with cues and assist  Transfers Overall transfer level: Needs  assistance   Transfers: Sit to/from Stand;Stand Pivot Transfers;Squat Pivot Transfers Sit to Stand: Mod assist;+2 safety/equipment Stand pivot transfers: Mod assist Squat pivot transfers: Min assist     General transfer comment: max cues for posture, hand placement and sequence to limit any strain to back as further imaging of spine may be warranted. In standing pt unable to shift weight onto LLE and heavy reliance on bil UE support for standing grossly 40sec prior to transfer to chair. In chair pt stating need to void end of session, squat pivot x 2 chair<>BSC with max cues and min assit for pivoting pelvis    Balance Overall balance assessment: Needs assistance   Sitting balance-Leahy Scale: Fair       Standing balance-Leahy Scale: Zero      Balance info per PT evaluation                        ADL Overall ADL's : Needs assistance/impaired                     Lower Body Dressing: Total assistance;Sit to/from stand (mod assist for sit<>stand)   Toilet Transfer: Moderate assistance;BSC;RW             General ADL Comments: Patient required moderate assist for sit<>stand for stand pivot BSC transfer. Patient with increased pain in R foot during standing (complaints of "lightning sharp pain"). Patient requried total assist for LB dressing. Therapist educated patient on back precautions as a precautionary measure, waiting spine MRI results.      Vision  No apparent visual impairments           Pertinent Vitals/Pain Pain Assessment: 0-10 Pain Score: 5  Pain Location:  (RLE)  Pain Descriptors / Indicators: Headache;Stabbing (during standing) Pain Intervention(s): Monitored during session;Repositioned     Hand Dominance Right   Extremity/Trunk Assessment Upper Extremity Assessment Upper Extremity Assessment: Generalized weakness;RUE deficits/detail RUE Deficits / Details: 2+/5 strength>shoulder, grossly 3+/5elbow>distally RUE Sensation: decreased  light touch (patient with complaints of "numbness") RUE Coordination: decreased fine motor;decreased gross motor   Lower Extremity Assessment Lower Extremity Assessment: Defer to PT evaluation LLE Deficits / Details: pt with decreased sensation in L4 dermatome distribution but noted weakness entire leg. Pt with 2/5 hip flexion, 2+/5 knee extension and flexion and only 1/5 dorsiflexion LLE Sensation: decreased light touch LLE Coordination: decreased gross motor   Cervical / Trunk Assessment Cervical / Trunk Assessment: Normal   Communication Communication Communication: Expressive difficulties   Cognition Arousal/Alertness: Awake/alert Behavior During Therapy: WFL for tasks assessed/performed Overall Cognitive Status: Within Functional Limits for tasks assessed                                Home Living Family/patient expects to be discharged to:: Private residence Living Arrangements: Children Available Help at Discharge: Family;Available 24 hours/day Type of Home: House       Home Layout: One level     Bathroom Shower/Tub: Tub/shower unit;Curtain   Firefighter: Standard     Home Equipment: Environmental consultant - 2 wheels;Cane - single point          Prior Functioning/Environment Level of Independence: Independent        Comments: pt does not drive but states she does everything else for herself    OT Diagnosis: Generalized weakness;Ataxia;Acute pain   OT Problem List: Decreased strength;Decreased range of motion;Decreased activity tolerance;Impaired balance (sitting and/or standing);Decreased coordination;Decreased safety awareness;Decreased knowledge of use of DME or AE;Impaired sensation;Impaired tone;Pain;Impaired UE functional use   OT Treatment/Interventions: Self-care/ADL training;Therapeutic exercise;Energy conservation;DME and/or AE instruction;Therapeutic activities;Patient/family education;Balance training    OT Goals(Current goals can be found in  the care plan section) Acute Rehab OT Goals Patient Stated Goal:  (none stated) OT Goal Formulation: With patient/family Time For Goal Achievement: 07/16/14 Potential to Achieve Goals: Good ADL Goals Pt Will Perform Grooming: with supervision;sitting Pt Will Perform Upper Body Bathing: with supervision;sitting Pt Will Perform Lower Body Bathing: with min assist;sit to/from stand Pt Will Perform Upper Body Dressing: with supervision;sitting Pt Will Perform Lower Body Dressing: with min assist;sit to/from stand Pt Will Transfer to Toilet: with min assist;ambulating;bedside commode (using DME prn) Pt Will Perform Tub/Shower Transfer: with min assist;ambulating;tub bench;rolling walker  OT Frequency: Min 2X/week   Barriers to D/C:  (none known at this time)             End of Session Equipment Utilized During Treatment: Gait belt;Rolling walker  Activity Tolerance: Patient tolerated treatment well;Patient limited by pain Patient left: in bed;with call bell/phone within reach;with chair alarm set;with family/visitor present   Time: 1610-9604 OT Time Calculation (min): 28 min Charges:  OT General Charges $OT Visit: 1 Procedure OT Evaluation $Initial OT Evaluation Tier I: 1 Procedure OT Treatments $Self Care/Home Management : 8-22 mins G-Codes: OT G-codes **NOT FOR INPATIENT CLASS** Functional Assessment Tool Used: Clincal Judgement Functional Limitation: Self care Self Care Current Status (V4098): At least 40 percent but less than 60 percent impaired, limited or restricted Self Care Goal Status (J1914): At least 1 percent but less than 20 percent impaired, limited or restricted  September Mormile , MS, OTR/L, CLT Pager: [phone] 07/09/2014, 11:43 AM

## 2014-07-09 NOTE — Progress Notes (Signed)
  Echocardiogram 2D Echocardiogram has been performed.  [name] FRANCES 07/09/2014, 8:40 AM

## 2014-07-09 NOTE — Consult Note (Signed)
Referring Physician: Lendell Caprice    Chief Complaint: left sided weakness, decreased sensation  HPI:                                                                                                                                         [name] is an 68 y.o. 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 Per family she had residual left sided weakness, word finding difficulty and memory problems.  This past Friday her daughter noted increased difficulty expressing herself and increased weakness in the left leg. She mentions her mother has had multiple complaints starting of Friday which include facial pain, left sided weakness, left arm and leg pain.  On consultation patient is awake and oriented and stating her left side is "her bad side" but cannot provide any further information.    Date last known well: Date: 07/05/2014 Time last known well: Unable to determine tPA Given: No: out of window  Past Medical History  Diagnosis Date  . Stroke   . Diabetes mellitus   . Hypertension   . H/O: CVA (cerebrovascular accident) 07/08/2014    Past Surgical History  Procedure Laterality Date  . No past surgeries      History reviewed. No pertinent family history. Social History:  reports that she has been smoking Cigarettes.  She has a 48 pack-year smoking history. She has never used smokeless tobacco. She reports that she does not drink alcohol or use illicit drugs.  Allergies:  Allergies  Allergen Reactions  . Penicillins Itching    Medications:                                                                                                                           Scheduled: .  stroke: mapping our early stages of recovery book   Does not apply Once  . aspirin  325 mg Oral Daily  . atenolol  25 mg Oral Daily  . clopidogrel  75 mg Oral Daily  . heparin  5,000 Units Subcutaneous 3 times per day  . insulin aspart  0-15 Units Subcutaneous TID WC   . insulin aspart  0-5 Units Subcutaneous QHS  . lisinopril  5 mg Oral Daily  . pantoprazole  40 mg Oral Daily    ROS:  History obtained from daughter  General ROS: negative for - chills, fatigue, fever, night sweats, weight gain or weight loss Psychological ROS: negative for - behavioral disorder, hallucinations, memory difficulties, mood swings or suicidal ideation Ophthalmic ROS: negative for - blurry vision, double vision, eye pain or loss of vision ENT ROS: negative for - epistaxis, nasal discharge, oral lesions, sore throat, tinnitus or vertigo Allergy and Immunology ROS: negative for - hives or itchy/watery eyes Hematological and Lymphatic ROS: negative for - bleeding problems, bruising or swollen lymph nodes Endocrine ROS: negative for - galactorrhea, hair pattern changes, polydipsia/polyuria or temperature intolerance Respiratory ROS: negative for - cough, hemoptysis, shortness of breath or wheezing Cardiovascular ROS: negative for - chest pain, dyspnea on exertion, edema or irregular heartbeat Gastrointestinal ROS: negative for - abdominal pain, diarrhea, hematemesis, nausea/vomiting or stool incontinence Genito-Urinary ROS: negative for - dysuria, hematuria, incontinence or urinary frequency/urgency Musculoskeletal ROS: negative for - joint swelling or muscular weakness Neurological ROS: as noted in HPI Dermatological ROS: negative for rash and skin lesion changes  Neurologic Examination:                                                                                                      Blood pressure 104/66, pulse 85, temperature 98.3 F (36.8 C), temperature source Axillary, resp. rate 16, height 5\' 2"  (1.575 m), weight 88.225 kg (194 lb 8 oz), SpO2 99.00%.   General:NAD Mental Status: Alert, oriented, thought content appropriate.  Speech  fluent with evidence of expressive aphasia.  Able to follow 3 step commands without difficulty. Cranial Nerves: II: Discs flat bilaterally; Visual fields grossly normal, pupils equal, round, reactive to light and accommodation III,IV, VI: ptosis not present, extra-ocular motions intact bilaterally V,VII: smile symmetric, facial light touch sensation stated to be decreased on the left side (splitting midline)  VIII: hearing normal bilaterally IX,X: gag reflex present XI: bilateral shoulder shrug XII: midline tongue extension without atrophy or fasciculations  Motor: Right : Upper extremity   5/5    Left:     Upper extremity   5/5  Lower extremity   5/5     Lower extremity   4-/5 Tone and bulk:normal tone throughout; no atrophy noted Sensory: decreased on the left arm and leg Deep Tendon Reflexes:  Right: Upper Extremity   Left: Upper extremity   biceps (C-5 to C-6) 2/4   biceps (C-5 to C-6) 2/4 tricep (C7) 2/4    triceps (C7) 2/4 Brachioradialis (C6) 2/4  Brachioradialis (C6) 2/4  Lower Extremity Lower Extremity  quadriceps (L-2 to L-4) 2/4   quadriceps (L-2 to L-4) 2/4 Achilles (S1) 2/4   Achilles (S1) 2/4  Plantars: Right: downgoing   Left: downgoing Cerebellar: normal finger-to-nose,  normal heel-to-shin test on the right and unable to take part in with left leg Gait: not tested.  CV: pulses palpable throughout    Lab Results: Basic Metabolic Panel:  Recent Labs Lab 07/08/14 1956  NA 143  K 4.5  CL 105  CO2 26  GLUCOSE 164*  BUN 12  CREATININE 1.10  CALCIUM 9.2  Liver Function Tests:  Recent Labs Lab 07/08/14 1956  AST 15  ALT 13  ALKPHOS 81  BILITOT 0.4  PROT 6.8  ALBUMIN 3.6   No results found for this basename: LIPASE, AMYLASE,  in the last 168 hours  Recent Labs Lab 07/09/14 0910  AMMONIA 49    CBC:  Recent Labs Lab 07/08/14 1956  WBC 9.0  HGB 13.0  HCT 39.4  MCV 84.4  PLT 213    Cardiac Enzymes: No results found for this  basename: CKTOTAL, CKMB, CKMBINDEX, TROPONINI,  in the last 168 hours  Lipid Panel: No results found for this basename: CHOL, TRIG, HDL, CHOLHDL, VLDL, LDLCALC,  in the last 168 hours  CBG:  Recent Labs Lab 07/08/14 2348 07/09/14 0027 07/09/14 0724 07/09/14 1133  GLUCAP 164* 162* 182* 148*    Microbiology: Results for orders placed during the hospital encounter of 08/15/13  URINE CULTURE     Status: None   Collection Time    08/16/13 12:24 AM      Result Value Ref Range Status   Specimen Description URINE, CLEAN CATCH   Final   Special Requests CX ADDED AT 0047 ON [id number]   Final   Culture  Setup Time     Final   Value: 08/16/2013 01:24     Performed at [facility] Count     Final   Value: 9,000 COLONIES/ML     Performed at [facility]   Culture     Final   Value: INSIGNIFICANT GROWTH     Performed at [facility]   Report Status 08/17/2013 FINAL   Final    Coagulation Studies: No results found for this basename: LABPROT, INR,  in the last 72 hours  Imaging: Dg Chest 2 View  07/08/2014   CLINICAL DATA:  Altered mental status and weakness.  EXAM: CHEST  2 VIEW  COMPARISON:  06/13/2011  FINDINGS: The patient is rotated towards the left. There are no focal consolidations or pleural effusions. No pulmonary edema.  IMPRESSION: No active cardiopulmonary disease.   Electronically Signed   By: Rosalie Gums M.D.   On: 07/08/2014 20:23   Ct Head Wo Contrast  07/08/2014   CLINICAL DATA:  Altered mental status. Left-sided weakness. LEFT-sided neurologic deficits. Initial encounter.  EXAM: CT HEAD WITHOUT CONTRAST  TECHNIQUE: Contiguous axial images were obtained from the base of the skull through the vertex without intravenous contrast.  COMPARISON:  06/13/2011.  FINDINGS: No mass lesion, mass effect, midline shift, hydrocephalus, hemorrhage. No acute territorial cortical ischemia/infarct. Atrophy and chronic ischemic white matter disease is present.  Chronic LEFT frontal encephalomalacia. Obliquely oriented low attenuation in the RIGHT cerebellar hemisphere is compatible with a prominent fissure. The calvarium appears intact. Scout images are within normal limits.  IMPRESSION: Atrophy, chronic ischemic white matter disease and LEFT frontal encephalomalacia without acute intracranial abnormality. No interval change.   Electronically Signed   By: Andreas Newport M.D.   On: 07/08/2014 20:33   Mr Brain Wo Contrast  07/08/2014   CLINICAL DATA:  TIA, left-sided weakness  EXAM: MRI HEAD WITHOUT CONTRAST  MRA HEAD WITHOUT CONTRAST  TECHNIQUE: Multiplanar, multiecho pulse sequences of the brain and surrounding structures were obtained without intravenous contrast. Angiographic images of the head were obtained using MRA technique without contrast.  COMPARISON:  Prior CT from earlier the same day as well as previous MRI from 12/26/2010  FINDINGS: MRI HEAD FINDINGS  Diffuse prominence of the CSF  containing spaces is compatible with generalized cerebral atrophy. Extensive encephalomalacia within the left frontal lobe is stable as compared to prior study, compatible with remote infarct. Remote right PCA territory infarct and right cerebellar infarcts noted. There are remote infarcts involving the posterior left temporal lobe as well as likely the right parietal lobe. Remote lacunar infarcts involving the bilateral basal ganglia are present, left greater than right.  Scattered and confluent T2/FLAIR hyperintensity within the periventricular and deep white matter both cerebral hemispheres is most compatible with chronic small vessel disease.  No mass lesion, midline shift, or extra-axial fluid collection. Ventricles are normal in size without evidence of hydrocephalus.  No diffusion-weighted signal abnormality is identified to suggest acute intracranial infarct. Gray-white matter differentiation is maintained. Normal flow voids are seen within the intracranial vasculature.  No intracranial hemorrhage identified. Evidence of hemosiderin deposition seen overlying the right cerebral hemisphere.  The cervicomedullary junction is normal. Pituitary gland is within normal limits. Pituitary stalk is midline. The globes and optic nerves demonstrate a normal appearance with normal signal intensity. The  The bone marrow signal intensity is normal. Calvarium is intact. Visualized upper cervical spine is within normal limits.  Scalp soft tissues are unremarkable.  Paranasal sinuses are clear.  No mastoid effusion.  MRA HEAD FINDINGS  ANTERIOR CIRCULATION:  Visualized portion of the distal cervical right ICA is widely patent without hemodynamically significant stenosis. On the right, mild multi focal atherosclerotic irregularity seen within the petrous, cavernous and supra clinoid right ICA without hemodynamically significant stenosis. The right M1 segment is widely patent with opacification of the distal right MCA branch vessels.  On the left, the visualized distal cervical segment of the left ICA is grossly patent. Extensive multi focal atherosclerotic irregularity with stenoses seen within the petrous and cavernous left ICA with diminished flow void. Supra clinoid left ICA grossly patent. There is proximal branch occlusion of the right left M1 segment, likely chronic in nature given the lack of acute infarct in the left MCA distribution. The left MCA branch vessels are on opacified.  A1 segment is hypoplastic. Anterior communicating artery is normal. Anterior cerebral arteries grossly patent.  POSTERIOR CIRCULATION:  Vertebral arteries are codominant and widely patent without hemodynamically significant stenosis. The origins of the posterior inferior cerebellar arteries are patent. Vertebrobasilar junction and basilar artery within normal limits. The right posterior cerebral artery is well opacified to its distal aspect. The proximal and mid left posterior cerebral artery is well opacified,  although appears occluded distally. Superior cerebral arteries grossly patent.  No aneurysm identified within the intracranial circulation.  IMPRESSION: MRI HEAD IMPRESSION:  1. No acute intracranial infarct or other abnormality identified. 2. 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 3. Advanced atrophy with chronic small vessel ischemic disease.  MRA HEAD IMPRESSION:  1. 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 2. Multi focal atherosclerotic irregularity and stenoses within the petrous and cavernous segments of the left ICA. 3. Probable occlusion of the distal left posterior cerebral artery.   Electronically Signed   By: Rise Mu M.D.   On: 07/08/2014 23:39   Mr Maxine Glenn Head/brain Wo Cm  07/08/2014   CLINICAL DATA:  TIA, left-sided weakness  EXAM: MRI HEAD WITHOUT CONTRAST  MRA HEAD WITHOUT CONTRAST  TECHNIQUE: Multiplanar, multiecho pulse sequences of the brain and surrounding structures were obtained without intravenous contrast. Angiographic images of the head were obtained using MRA technique without contrast.  COMPARISON:  Prior CT from earlier the same day as well as previous MRI from 12/26/2010  FINDINGS: MRI HEAD FINDINGS  Diffuse prominence of the CSF containing spaces is compatible with generalized cerebral atrophy. Extensive encephalomalacia within the left frontal lobe is stable as compared to prior study, compatible with remote infarct. Remote right PCA territory infarct and right cerebellar infarcts noted. There are remote infarcts involving the posterior left temporal lobe as well as likely the right parietal lobe. Remote lacunar infarcts involving the bilateral basal ganglia are present, left greater than right.  Scattered and confluent T2/FLAIR hyperintensity within the periventricular and deep white matter both cerebral hemispheres is most compatible with chronic small vessel disease.  No mass lesion, midline  shift, or extra-axial fluid collection. Ventricles are normal in size without evidence of hydrocephalus.  No diffusion-weighted signal abnormality is identified to suggest acute intracranial infarct. Gray-white matter differentiation is maintained. Normal flow voids are seen within the intracranial vasculature. No intracranial hemorrhage identified. Evidence of hemosiderin deposition seen overlying the right cerebral hemisphere.  The cervicomedullary junction is normal. Pituitary gland is within normal limits. Pituitary stalk is midline. The globes and optic nerves demonstrate a normal appearance with normal signal intensity. The  The bone marrow signal intensity is normal. Calvarium is intact. Visualized upper cervical spine is within normal limits.  Scalp soft tissues are unremarkable.  Paranasal sinuses are clear.  No mastoid effusion.  MRA HEAD FINDINGS  ANTERIOR CIRCULATION:  Visualized portion of the distal cervical right ICA is widely patent without hemodynamically significant stenosis. On the right, mild multi focal atherosclerotic irregularity seen within the petrous, cavernous and supra clinoid right ICA without hemodynamically significant stenosis. The right M1 segment is widely patent with opacification of the distal right MCA branch vessels.  On the left, the visualized distal cervical segment of the left ICA is grossly patent. Extensive multi focal atherosclerotic irregularity with stenoses seen within the petrous and cavernous left ICA with diminished flow void. Supra clinoid left ICA grossly patent. There is proximal branch occlusion of the right left M1 segment, likely chronic in nature given the lack of acute infarct in the left MCA distribution. The left MCA branch vessels are on opacified.  A1 segment is hypoplastic. Anterior communicating artery is normal. Anterior cerebral arteries grossly patent.  POSTERIOR CIRCULATION:  Vertebral arteries are codominant and widely patent without  hemodynamically significant stenosis. The origins of the posterior inferior cerebellar arteries are patent. Vertebrobasilar junction and basilar artery within normal limits. The right posterior cerebral artery is well opacified to its distal aspect. The proximal and mid left posterior cerebral artery is well opacified, although appears occluded distally. Superior cerebral arteries grossly patent.  No aneurysm identified within the intracranial circulation.  IMPRESSION: MRI HEAD IMPRESSION:  1. No acute intracranial infarct or other abnormality identified. 2. 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 3. Advanced atrophy with chronic small vessel ischemic disease.  MRA HEAD IMPRESSION:  1. 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 2. Multi focal atherosclerotic irregularity and stenoses within the petrous and cavernous segments of the left ICA. 3. Probable occlusion of the distal left posterior cerebral artery.   Electronically Signed   By: Rise Mu M.D.   On: 07/08/2014 23:39       Assessment and plan discussed with with attending physician and they are in agreement.    Felicie Morn PA-C [facility] [phone]  07/09/2014, 12:22 PM  Assessment: 68 y.o. female with 4 days of left sided decreased sensation and weakness.  The decreased sensations includes the face along with the arm and leg. MRI brain has been obtained which shows no acute intracranial abnormality. At this time etiology is unclear .  Obtaining MRI C-spine will be helpful to evaluate for arm and leg weakness but will not account for her decreased facial numbness. Patient has history of right SDH and possibly could have had a seizure with todd's paralysis, however, 4 days out one would expect her symptoms to have improved.   Stroke Risk Factors - diabetes mellitus and hypertension  Recommend:  1) Agree with MRI C-Spine 2) EEG  Patient  seen and examined together with physician assistant and I concur with the assessment and plan.  Wyatt Portelasvaldo Camilo, MD

## 2014-07-09 NOTE — Evaluation (Signed)
Physical Therapy Evaluation Patient Details Name: [name] MRN: [id number] DOB: May 17, 1946 Today's Date: 07/09/2014   History of Present Illness  [name] is a 68 y.o. female with Past medical history of CVA, subdural hemorrhage, diabetes mellitus, hypertension, possible dementia. Who presented for left foot weakness and leg dragging. Pt with baseline impaired speech from prior CVA  Clinical Impression  Pt pleasant with expressive difficulties. Pt reports acute onset LLE weakness with report of back pain in the low back and hips starting grossly 2weeks ago. Pt reports only one fall in the last year and being independent prior to this. Pt lives with one dgtr but family assist from both dgtrs available 24hrs/day. MRI of head negative with planned Cspine MRI but lumbar imaging may need to be considered. Pt will benefit from acute therapy to maximize mobility, transfers, function and strength to decrease burden of care and increase quality of life. Pt verbally educated for back precautions pending further imaging.    Follow Up Recommendations CIR;Supervision/Assistance - 24 hour    Equipment Recommendations  3in1 (PT);Wheelchair (measurements PT);Wheelchair cushion (measurements PT);Other (comment) (tub bench)    Recommendations for Other Services       Precautions / Restrictions Precautions Precautions: Fall Precaution Comments: expressive aphasia  Educated for back precautions prior to imaging r/o spinal concerns     Mobility  Bed Mobility Overal bed mobility: Needs Assistance Bed Mobility: Rolling;Sidelying to Sit Rolling: Supervision Sidelying to sit: Min assist       General bed mobility comments: cues for sequence as pt initially struggling with sequence for bed mobility with heavy reliance on rails but improved with cues and assist  Transfers Overall transfer level: Needs assistance   Transfers: Sit to/from Stand;Stand Pivot Transfers;Squat Pivot  Transfers Sit to Stand: Mod assist;+2 safety/equipment Stand pivot transfers: Mod assist Squat pivot transfers: Min assist     General transfer comment: max cues for posture, hand placement and sequence to limit any strain to back as further imaging of spine may be warranted. In standing pt unable to shift weight onto LLE and heavy reliance on bil UE support for standing grossly 40sec prior to transfer to chair. In chair pt stating need to void end of session, squat pivot x 2 chair<>BSC with max cues and min assit for pivoting pelvis  Ambulation/Gait                Stairs            Wheelchair Mobility    Modified Rankin (Stroke Patients Only)       Balance Overall balance assessment: Needs assistance   Sitting balance-Leahy Scale: Fair       Standing balance-Leahy Scale: Zero                               Pertinent Vitals/Pain Pain Assessment: No/denies pain    Home Living Family/patient expects to be discharged to:: Private residence Living Arrangements: Children Available Help at Discharge: Family;Available 24 hours/day Type of Home: House       Home Layout: One level Home Equipment: Walker - 2 wheels;Cane - single point      Prior Function Level of Independence: Independent         Comments: pt does not drive but states she does everything else for herself     Hand Dominance        Extremity/Trunk Assessment   Upper Extremity Assessment: Overall [facility]  for tasks assessed           Lower Extremity Assessment: LLE deficits/detail   LLE Deficits / Details: pt with decreased sensation in L4 dermatome distribution but noted weakness entire leg. Pt with 2/5 hip flexion, 2+/5 knee extension and flexion and only 1/5 dorsiflexion  Cervical / Trunk Assessment: Normal  Communication   Communication: Expressive difficulties  Cognition Arousal/Alertness: Awake/alert Behavior During Therapy: WFL for tasks  assessed/performed Overall Cognitive Status: Within Functional Limits for tasks assessed (difficulty answering birthdate but unclear if cognition or expressive aphasia)                      General Comments      Exercises        Assessment/Plan    PT Assessment Patient needs continued PT services  PT Diagnosis Abnormality of gait   PT Problem List Decreased strength;Decreased activity tolerance;Decreased balance;Decreased mobility;Decreased knowledge of use of DME  PT Treatment Interventions DME instruction;Gait training;Functional mobility training;Therapeutic activities;Therapeutic exercise;Patient/family education;Neuromuscular re-education   PT Goals (Current goals can be found in the Care Plan section) Acute Rehab PT Goals Patient Stated Goal: be able to care for myself PT Goal Formulation: With patient/family Time For Goal Achievement: 07/23/14 Potential to Achieve Goals: Fair    Frequency Min 4X/week   Barriers to discharge        Co-evaluation               End of Session Equipment Utilized During Treatment: Gait belt Activity Tolerance: Patient tolerated treatment well Patient left: in chair;with call bell/phone within reach;with chair alarm set;with family/visitor present Nurse Communication: Mobility status;Precautions    Functional Assessment Tool Used: clinical judgement Functional Limitation: Mobility: Walking and moving around Mobility: Walking and Moving Around Current Status [phone]): At least 60 percent but less than 80 percent impaired, limited or restricted Mobility: Walking and Moving Around Goal Status [phone]): At least 1 percent but less than 20 percent impaired, limited or restricted    Time: 0921-0951 PT Time Calculation (min): 30 min   Charges:   PT Evaluation $Initial PT Evaluation Tier I: 1 Procedure PT Treatments $Therapeutic Activity: 23-37 mins   PT G Codes:   Functional Assessment Tool Used: clinical  judgement Functional Limitation: Mobility: Walking and moving around    Delorse Lek 07/09/2014, 10:03 AM Delaney Meigs, PT [phone]

## 2014-07-09 NOTE — Progress Notes (Signed)
[facility] PROGRESS NOTE  RUTHI TEAL WUX:[id number] DOB: February 07, 1946 DOA: 07/08/2014 PCP: Dorrene German, MD  Chart reviewed.  Assessment/Plan:  Principal Problem:   Left leg weakness/arm:  MRI shows only old infarcts.  MRA multivessel occlusion, atherosclerosis. Still with significant weakness on exam.  Per daughter, this is new.  Admitting MD unable to contact neuro. Will consult today.  Likely needs MRI spine, but will await neuro eval. Continue same meds. PT/OT eval pending. Echo, lipids, carotid dopplers pending Active Problems:   Hypertension   H/O: CVA (cerebrovascular accident)   H/O subdural hemorrhage   DM type 2 (diabetes mellitus, type 2)   Code Status:  full Family Communication:  Daughter by phone Disposition Plan:    Consultants:  Paged neuro  Procedures:     Antibiotics:    HPI/Subjective: No new complaints.  Objective: Filed Vitals:   07/09/14 0800  BP: 104/66  Pulse: 78  Temp:   Resp:     Intake/Output Summary (Last 24 hours) at 07/09/14 0917 Last data filed at 07/09/14 0030  Gross per 24 hour  Intake    240 ml  Output      0 ml  Net    240 ml   Filed Weights   07/09/14 0037  Weight: 88.225 kg (194 lb 8 oz)    Exam:   General:  Alert. Feeding self breakfast. Disoriented to time  Cardiovascular: RRR without MGR  Respiratory: CTA without WRR  Abdomen: S, NT, ND  Ext: no CCE  Neuro: CN intact. Left leg strenth 2-3/5. Left arm strength 4/5  Basic Metabolic Panel:  Recent Labs Lab 07/08/14 1956  NA 143  K 4.5  CL 105  CO2 26  GLUCOSE 164*  BUN 12  CREATININE 1.10  CALCIUM 9.2   Liver Function Tests:  Recent Labs Lab 07/08/14 1956  AST 15  ALT 13  ALKPHOS 81  BILITOT 0.4  PROT 6.8  ALBUMIN 3.6   No results found for this basename: LIPASE, AMYLASE,  in the last 168 hours No results found for this basename: AMMONIA,  in the last 168 hours CBC:  Recent Labs Lab 07/08/14 1956  WBC 9.0   HGB 13.0  HCT 39.4  MCV 84.4  PLT 213   Cardiac Enzymes: No results found for this basename: CKTOTAL, CKMB, CKMBINDEX, TROPONINI,  in the last 168 hours BNP (last 3 results) No results found for this basename: PROBNP,  in the last 8760 hours CBG:  Recent Labs Lab 07/08/14 2348 07/09/14 0027 07/09/14 0724  GLUCAP 164* 162* 182*    No results found for this or any previous visit (from the past 240 hour(s)).   Studies: Dg Chest 2 View  07/08/2014   CLINICAL DATA:  Altered mental status and weakness.  EXAM: CHEST  2 VIEW  COMPARISON:  06/13/2011  FINDINGS: The patient is rotated towards the left. There are no focal consolidations or pleural effusions. No pulmonary edema.  IMPRESSION: No active cardiopulmonary disease.   Electronically Signed   By: Rosalie Gums M.D.   On: 07/08/2014 20:23   Ct Head Wo Contrast  07/08/2014   CLINICAL DATA:  Altered mental status. Left-sided weakness. LEFT-sided neurologic deficits. Initial encounter.  EXAM: CT HEAD WITHOUT CONTRAST  TECHNIQUE: Contiguous axial images were obtained from the base of the skull through the vertex without intravenous contrast.  COMPARISON:  06/13/2011.  FINDINGS: No mass lesion, mass effect, midline shift, hydrocephalus, hemorrhage. No acute territorial cortical ischemia/infarct. Atrophy and chronic ischemic white  matter disease is present. Chronic LEFT frontal encephalomalacia. Obliquely oriented low attenuation in the RIGHT cerebellar hemisphere is compatible with a prominent fissure. The calvarium appears intact. Scout images are within normal limits.  IMPRESSION: Atrophy, chronic ischemic white matter disease and LEFT frontal encephalomalacia without acute intracranial abnormality. No interval change.   Electronically Signed   By: Andreas Newport M.D.   On: 07/08/2014 20:33   Mr Brain Wo Contrast  07/08/2014   CLINICAL DATA:  TIA, left-sided weakness  EXAM: MRI HEAD WITHOUT CONTRAST  MRA HEAD WITHOUT CONTRAST  TECHNIQUE:  Multiplanar, multiecho pulse sequences of the brain and surrounding structures were obtained without intravenous contrast. Angiographic images of the head were obtained using MRA technique without contrast.  COMPARISON:  Prior CT from earlier the same day as well as previous MRI from 12/26/2010  FINDINGS: MRI HEAD FINDINGS  Diffuse prominence of the CSF containing spaces is compatible with generalized cerebral atrophy. Extensive encephalomalacia within the left frontal lobe is stable as compared to prior study, compatible with remote infarct. Remote right PCA territory infarct and right cerebellar infarcts noted. There are remote infarcts involving the posterior left temporal lobe as well as likely the right parietal lobe. Remote lacunar infarcts involving the bilateral basal ganglia are present, left greater than right.  Scattered and confluent T2/FLAIR hyperintensity within the periventricular and deep white matter both cerebral hemispheres is most compatible with chronic small vessel disease.  No mass lesion, midline shift, or extra-axial fluid collection. Ventricles are normal in size without evidence of hydrocephalus.  No diffusion-weighted signal abnormality is identified to suggest acute intracranial infarct. Gray-white matter differentiation is maintained. Normal flow voids are seen within the intracranial vasculature. No intracranial hemorrhage identified. Evidence of hemosiderin deposition seen overlying the right cerebral hemisphere.  The cervicomedullary junction is normal. Pituitary gland is within normal limits. Pituitary stalk is midline. The globes and optic nerves demonstrate a normal appearance with normal signal intensity. The  The bone marrow signal intensity is normal. Calvarium is intact. Visualized upper cervical spine is within normal limits.  Scalp soft tissues are unremarkable.  Paranasal sinuses are clear.  No mastoid effusion.  MRA HEAD FINDINGS  ANTERIOR CIRCULATION:  Visualized portion  of the distal cervical right ICA is widely patent without hemodynamically significant stenosis. On the right, mild multi focal atherosclerotic irregularity seen within the petrous, cavernous and supra clinoid right ICA without hemodynamically significant stenosis. The right M1 segment is widely patent with opacification of the distal right MCA branch vessels.  On the left, the visualized distal cervical segment of the left ICA is grossly patent. Extensive multi focal atherosclerotic irregularity with stenoses seen within the petrous and cavernous left ICA with diminished flow void. Supra clinoid left ICA grossly patent. There is proximal branch occlusion of the right left M1 segment, likely chronic in nature given the lack of acute infarct in the left MCA distribution. The left MCA branch vessels are on opacified.  A1 segment is hypoplastic. Anterior communicating artery is normal. Anterior cerebral arteries grossly patent.  POSTERIOR CIRCULATION:  Vertebral arteries are codominant and widely patent without hemodynamically significant stenosis. The origins of the posterior inferior cerebellar arteries are patent. Vertebrobasilar junction and basilar artery within normal limits. The right posterior cerebral artery is well opacified to its distal aspect. The proximal and mid left posterior cerebral artery is well opacified, although appears occluded distally. Superior cerebral arteries grossly patent.  No aneurysm identified within the intracranial circulation.  IMPRESSION: MRI HEAD IMPRESSION:  1.  No acute intracranial infarct or other abnormality identified. 2. 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 3. Advanced atrophy with chronic small vessel ischemic disease.  MRA HEAD IMPRESSION:  1. 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 2. Multi focal atherosclerotic irregularity and stenoses within the petrous and cavernous segments of the left ICA.  3. Probable occlusion of the distal left posterior cerebral artery.   Electronically Signed   By: Rise Mu M.D.   On: 07/08/2014 23:39   Mr Maxine Glenn Head/brain Wo Cm  07/08/2014   CLINICAL DATA:  TIA, left-sided weakness  EXAM: MRI HEAD WITHOUT CONTRAST  MRA HEAD WITHOUT CONTRAST  TECHNIQUE: Multiplanar, multiecho pulse sequences of the brain and surrounding structures were obtained without intravenous contrast. Angiographic images of the head were obtained using MRA technique without contrast.  COMPARISON:  Prior CT from earlier the same day as well as previous MRI from 12/26/2010  FINDINGS: MRI HEAD FINDINGS  Diffuse prominence of the CSF containing spaces is compatible with generalized cerebral atrophy. Extensive encephalomalacia within the left frontal lobe is stable as compared to prior study, compatible with remote infarct. Remote right PCA territory infarct and right cerebellar infarcts noted. There are remote infarcts involving the posterior left temporal lobe as well as likely the right parietal lobe. Remote lacunar infarcts involving the bilateral basal ganglia are present, left greater than right.  Scattered and confluent T2/FLAIR hyperintensity within the periventricular and deep white matter both cerebral hemispheres is most compatible with chronic small vessel disease.  No mass lesion, midline shift, or extra-axial fluid collection. Ventricles are normal in size without evidence of hydrocephalus.  No diffusion-weighted signal abnormality is identified to suggest acute intracranial infarct. Gray-white matter differentiation is maintained. Normal flow voids are seen within the intracranial vasculature. No intracranial hemorrhage identified. Evidence of hemosiderin deposition seen overlying the right cerebral hemisphere.  The cervicomedullary junction is normal. Pituitary gland is within normal limits. Pituitary stalk is midline. The globes and optic nerves demonstrate a normal appearance with  normal signal intensity. The  The bone marrow signal intensity is normal. Calvarium is intact. Visualized upper cervical spine is within normal limits.  Scalp soft tissues are unremarkable.  Paranasal sinuses are clear.  No mastoid effusion.  MRA HEAD FINDINGS  ANTERIOR CIRCULATION:  Visualized portion of the distal cervical right ICA is widely patent without hemodynamically significant stenosis. On the right, mild multi focal atherosclerotic irregularity seen within the petrous, cavernous and supra clinoid right ICA without hemodynamically significant stenosis. The right M1 segment is widely patent with opacification of the distal right MCA branch vessels.  On the left, the visualized distal cervical segment of the left ICA is grossly patent. Extensive multi focal atherosclerotic irregularity with stenoses seen within the petrous and cavernous left ICA with diminished flow void. Supra clinoid left ICA grossly patent. There is proximal branch occlusion of the right left M1 segment, likely chronic in nature given the lack of acute infarct in the left MCA distribution. The left MCA branch vessels are on opacified.  A1 segment is hypoplastic. Anterior communicating artery is normal. Anterior cerebral arteries grossly patent.  POSTERIOR CIRCULATION:  Vertebral arteries are codominant and widely patent without hemodynamically significant stenosis. The origins of the posterior inferior cerebellar arteries are patent. Vertebrobasilar junction and basilar artery within normal limits. The right posterior cerebral artery is well opacified to its distal aspect. The proximal and mid left posterior cerebral artery is  well opacified, although appears occluded distally. Superior cerebral arteries grossly patent.  No aneurysm identified within the intracranial circulation.  IMPRESSION: MRI HEAD IMPRESSION:  1. No acute intracranial infarct or other abnormality identified. 2. Multiple old large and small vessel territory infarcts as  above. 3. Advanced atrophy with chronic small vessel ischemic disease.  MRA HEAD IMPRESSION:  1. 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 2. Multi focal atherosclerotic irregularity and stenoses within the petrous and cavernous segments of the left ICA. 3. Probable occlusion of the distal left posterior cerebral artery.   Electronically Signed   By: Rise Mu M.D.   On: 07/08/2014 23:39    Scheduled Meds: .  stroke: mapping our early stages of recovery book   Does not apply Once  . aspirin  325 mg Oral Daily  . atenolol  25 mg Oral Daily  . clopidogrel  75 mg Oral Daily  . heparin  5,000 Units Subcutaneous 3 times per day  . insulin aspart  0-15 Units Subcutaneous TID WC  . insulin aspart  0-5 Units Subcutaneous QHS  . lisinopril  5 mg Oral Daily  . pantoprazole  40 mg Oral Daily   Continuous Infusions:   Time spent: 35 minutes  Jalonda Antigua L  [facility] Pager [phone]. If 7PM-7AM, please contact night-coverage at [email or web address], password [facility] 07/09/2014, 9:17 AM  LOS: 1 day

## 2014-07-09 NOTE — Care Management Note (Signed)
    Page 1 of 2   07/11/2014     10:43:00 AM CARE MANAGEMENT NOTE 07/11/2014  Patient:  [name],Misty D   Account Number:  [id number]  Date Initiated:  07/09/2014  Documentation initiated by:  GRAVES-BIGELOW,Carrolyn Hilmes  Subjective/Objective Assessment:   Pt admitted for AMS x2 days. Hx of CVA, subdural hemorrhage, diabetes mellitus, hypertension & possible dementia .Per daughter pt had an appointment at PCP office and office asked for 125.00 before pt could be seen. Pt came to ED for care.     Action/Plan:   CM discussed with daughter concerns of pt not having medicare benefit. Per daughter pt has been at current residence for 2 yrs. Pt did have medicaid in place. CM called the Financial Counselor in regards to no insurance.   Anticipated DC Date:  07/10/2014   Anticipated DC Plan:  HOME W HOME HEALTH SERVICES  In-house referral  Financial Counselor      DC Planning Services  CM consult  Medication Assistance      [facility]  [facility]   Choice offered to / List presented to:  C-4 Adult Children        HH arranged  HH-2 PT  HH-3 OT      [facility]  [facility]   Status of service:  Completed, signed off Medicare Important Message given?  NO (If response is "NO", the following Medicare IM given date fields will be blank) Date Medicare IM given:   Medicare IM given by:   Date Additional Medicare IM given:   Additional Medicare IM given by:    Discharge Disposition:  HOME W HOME HEALTH SERVICES  Per UR Regulation:  Reviewed for med. necessity/level of care/duration of stay  If discussed at Long Length of Stay Meetings, dates discussed:    Comments:  07-11-14 [address] Tomi BambergerBrenda Graves-Bigelow, RN,BSN [phone] CM did offer choice to daughter Steward DroneBrenda [facility] CM did make referral and SOC to begin within 24-48 hours post d/c. CM did ask daughter about Speech Therapy and pt has had before. Pt at baseline per daughter. Pt to f/u with MD  [facility]uere for hospital f/u. Medicare/ Medicaid is now active. No further needs from CM at this time.   07-09-14 1622 Tomi BambergerBrenda Graves-Bigelow, [place] [phone] CM was able to speak to daughter in ref to medicare. Once pt's husband died she was receiving his medicare benefit. Once pt became eligible for Medicare pt did not sign up for open enrollment. Pt was using husband's benefit. Insurance was dropped. CM did explain to pt/ daughter to call Medicare and explain situation. CM did discuss  with daughter and [facility] and medication assistance. CM will f/u.   07-09-14 1046 Tomi BambergerBrenda Graves-Bigelow, RN,BSN [phone] Pt lives with daughter Steward DroneBrenda, however she works first shift. Pt has another daughter that will be coming over in am when sister leaves until she returns home from work. CM did discuss with pt and daughter the option of changing PCP. CM will wait to discuss change with daughter Steward DroneBrenda once she arrives this am. If pt was to change to [facility] pt will have access to pharmacy and CSW onsite. CSW to assist with medicaid and medicare benefits. PER PT recommendations- [facility] however family feels that pt will not need at this time. Pt will benefit from RW and 3-n-1. CM will f/u.

## 2014-07-09 NOTE — Progress Notes (Signed)
Received prescreen request for inpatient rehab and have reviewed pt's case. Noted that pt is in observation status. At this time, pt would not meet the medical necessity criteria for an inpatient stay based on her observation status.  If pt does get admitted, then could consider possible inpatient rehab consult.   Please call me with any questions. Thanks.  Juliann MuleJanine Kemora Pinard, PT Rehabilitation Admissions Coordinator [phone]

## 2014-07-09 NOTE — Progress Notes (Signed)
UR completed 

## 2014-07-09 NOTE — Progress Notes (Signed)
*  PRELIMINARY RESULTS* Vascular Ultrasound Carotid Duplex (Doppler) has been completed.  Preliminary findings: Bilateral: 1-39% ICA stenosis. Right: Tortuous mid ICA. Bilateral: Vertebral artery flow is antegrade.  Cathie BeamsGREGORY, Ishmael Berkovich 07/09/2014, 1:53 PM

## 2014-07-10 DIAGNOSIS — R29810 Facial weakness: Secondary | ICD-10-CM

## 2014-07-10 LAB — LIPID PANEL
CHOL/HDL RATIO: 7 ratio
CHOLESTEROL: 265 mg/dL — AB (ref 0–200)
HDL: 38 mg/dL — AB (ref >39)
LDL Cholesterol: 179 mg/dL — ABNORMAL HIGH (ref 0–99)
Triglycerides: 239 mg/dL — ABNORMAL HIGH (ref <150)
VLDL: 48 mg/dL — ABNORMAL HIGH (ref 0–40)

## 2014-07-10 LAB — FOLATE RBC: RBC Folate: 798 ng/mL — ABNORMAL HIGH (ref >280)

## 2014-07-10 LAB — GLUCOSE, CAPILLARY
Glucose-Capillary: 202 mg/dL — ABNORMAL HIGH (ref 70–99)
Glucose-Capillary: 155 mg/dL — ABNORMAL HIGH (ref 70–99)
Glucose-Capillary: 135 mg/dL — ABNORMAL HIGH (ref 70–99)
Glucose-Capillary: 184 mg/dL — ABNORMAL HIGH (ref 70–99)
Glucose-Capillary: 125 mg/dL — ABNORMAL HIGH (ref 70–99)

## 2014-07-10 NOTE — Progress Notes (Signed)
Physical Therapy Treatment Patient Details Name: [name] MRN: [id number] DOB: Apr 25, 1946 Today's Date: 07/10/2014    History of Present Illness [name] is a 68 y.o. female with Past medical history of CVA, subdural hemorrhage, diabetes mellitus, hypertension, possible dementia. Who presented for left foot weakness and leg dragging. Pt with baseline impaired speech from prior CVA. Pt with resolution of LLE weakness.    PT Comments    Patient progressing well with mobility. Improved ambulation distance. LLE weakness and sensation deficits seem to have resolved during MMT however pt still demonstrates some decreased functional strength in LLE during gait training, with increased distance. Pt impulsive today and exhibiting some balance deficits. Goals updated based on improvement and progress. Recommend use of RW for safety during mobility. Encourage mobility with RN daily to bathroom. Will continue to follow and progress as tolerated.   Follow Up Recommendations  CIR;Supervision/Assistance - 24 hour     Equipment Recommendations  Other (comment) (defer to CIR)    Recommendations for Other Services       Precautions / Restrictions Precautions Precautions: Fall Precaution Comments: expressive aphasia Restrictions Weight Bearing Restrictions: No    Mobility  Bed Mobility Overal bed mobility: Needs Assistance Bed Mobility: Rolling;Sidelying to Sit Rolling: Supervision Sidelying to sit: Supervision       General bed mobility comments: Pt impulsive- quickly transferring to EOB due to needing to go to the bathroom. No assist required. Supervision for safety due to impulsiveness.  Transfers Overall transfer level: Needs assistance Equipment used: Rolling walker (2 wheeled) Transfers: Sit to/from [facility] Sit to Stand: Min guard Stand pivot transfers: Min guard       General transfer comment: Stood from EOB x1 with Min guard for safety  secondary to impulsivity. Able to transfer on/off toilet with Min guard assist and use of grab bar; Min assist to help guide bottom onto toilet however able to stand and perform pericare independently.  Ambulation/Gait Ambulation/Gait assistance: Min assist Ambulation Distance (Feet): 150 Feet Assistive device: Rolling walker (2 wheeled) Gait Pattern/deviations: Step-to pattern;Step-through pattern;Decreased stance time - left;Decreased step length - right;Decreased stride length;Trunk flexed Gait velocity: Decreased   General Gait Details: Initially, pt with step to gait pattern with left knee extension thrust during stance phase progressing to step through gait pattern with cues to take longer strides. Emphasis on use of UEs for support. Instances of left partial knee buckling but pt able to maintain stability. VC for RW management. Difficulty with turns.    Stairs            Wheelchair Mobility    Modified Rankin (Stroke Patients Only)       Balance Overall balance assessment: Needs assistance   Sitting balance-Leahy Scale: Good       Standing balance-Leahy Scale: Poor Standing balance comment: Requires use of RW for support during mobility and gait training due to balance deficits and demonstrated functional weakness on LLE.                    Cognition Arousal/Alertness: Awake/alert Behavior During Therapy: WFL for tasks assessed/performed;Impulsive Overall Cognitive Status: Within Functional Limits for tasks assessed                      Exercises Other Exercises Other Exercises: Sit to stand x10 from low recliner with use of UEs pushing off arm rests for strengthening.     General Comments General comments (skin integrity, edema, etc.): MMT  performed on LLE- hip flexion 4+/5, knee extension 4+/5, ankle DF 4+/5. Reports sensation has returned to baseline.      Pertinent Vitals/Pain Pain Assessment: No/denies pain    Home Living                       Prior Function            PT Goals (current goals can now be found in the care plan section) Progress towards PT goals: Progressing toward goals;Goals met and updated - see care plan    Frequency  Min 4X/week    PT Plan Current plan remains appropriate    Co-evaluation             End of Session Equipment Utilized During Treatment: Gait belt Activity Tolerance: Patient tolerated treatment well Patient left: in chair;with call bell/phone within reach;with chair alarm set;with nursing/sitter in room     Time: 1430-1500 PT Time Calculation (min): 30 min  Charges:  $Gait Training: 8-22 mins $Therapeutic Activity: 8-22 mins                    G CodesCandy Sledge A 07/17/14, 3:27 PM Candy Sledge, [place], DPT [phone]

## 2014-07-10 NOTE — Progress Notes (Signed)
At 0243 pt had 4 beats of Vtach/ SVT. Pt asymptomatic and sleeping at the time. Cont to monitor.

## 2014-07-10 NOTE — Progress Notes (Signed)
[facility] PROGRESS NOTE  [name] GUR:[id number] DOB: 1946/08/24 DOA: 07/08/2014 PCP: Dorrene German, MD  Assessment/Plan: Left leg weakness/arm: MRI shows only old infarcts. MRA multivessel occlusion, atherosclerosis. Per my exam, pt reported continued L sided weakness. However, pt's exam revealed resolved weakness. MRI cervical spine pending. If neg, Neurology recommends no further work up.   Hypertension  BP stable and controlled  H/O: CVA (cerebrovascular accident)  No acute CVA on MRI  H/O subdural hemorrhage  No hemorrhage on imaging noted  DM type 2 (diabetes mellitus, type 2) Glucose has remained stable thus far  Code Status: Full Family Communication: Pt in room (indicate person spoken with, relationship, and if by phone, the number) Disposition Plan: Pending   Consultants:  Neurology  Antibiotics:  None  HPI/Subjective: Reported continued L sided weakness to me, however reported feeling at baseline when seen by Neurology.  Objective: Filed Vitals:   07/10/14 0000 07/10/14 0355 07/10/14 0734 07/10/14 1100  BP: 115/56 128/54 126/70 119/51  Pulse: 70 70 66 63  Temp: 98.3 F (36.8 C) 98.2 F (36.8 C) 97.8 F (36.6 C) 98.5 F (36.9 C)  TempSrc: Oral Oral Oral Oral  Resp: 18 18 16    Height:      Weight: 88.455 kg (195 lb 0.1 oz)     SpO2: 100% 100% 99% 99%    Intake/Output Summary (Last 24 hours) at 07/10/14 1245 Last data filed at 07/10/14 0600  Gross per 24 hour  Intake      0 ml  Output    650 ml  Net   -650 ml   Filed Weights   07/09/14 0037 07/10/14 0000  Weight: 88.225 kg (194 lb 8 oz) 88.455 kg (195 lb 0.1 oz)    Exam:   General:  Awake, in nad  Cardiovascular: regular, s1, s2  Respiratory: normal resp effort, no wheezing  Abdomen: soft, nondistended  Musculoskeletal: perfused, no clubbing   Data Reviewed: Basic Metabolic Panel:  Recent Labs Lab 07/08/14 1956  NA 143  K 4.5  CL 105  CO2 26  GLUCOSE  164*  BUN 12  CREATININE 1.10  CALCIUM 9.2   Liver Function Tests:  Recent Labs Lab 07/08/14 1956  AST 15  ALT 13  ALKPHOS 81  BILITOT 0.4  PROT 6.8  ALBUMIN 3.6   No results found for this basename: LIPASE, AMYLASE,  in the last 168 hours  Recent Labs Lab 07/09/14 0910  AMMONIA 49   CBC:  Recent Labs Lab 07/08/14 1956  WBC 9.0  HGB 13.0  HCT 39.4  MCV 84.4  PLT 213   Cardiac Enzymes: No results found for this basename: CKTOTAL, CKMB, CKMBINDEX, TROPONINI,  in the last 168 hours BNP (last 3 results) No results found for this basename: PROBNP,  in the last 8760 hours CBG:  Recent Labs Lab 07/09/14 1133 07/09/14 1657 07/09/14 2100 07/10/14 0733 07/10/14 1119  GLUCAP 148* 134* 211* 202* 155*    No results found for this or any previous visit (from the past 240 hour(s)).   Studies: Dg Chest 2 View  07/08/2014   CLINICAL DATA:  Altered mental status and weakness.  EXAM: CHEST  2 VIEW  COMPARISON:  06/13/2011  FINDINGS: The patient is rotated towards the left. There are no focal consolidations or pleural effusions. No pulmonary edema.  IMPRESSION: No active cardiopulmonary disease.   Electronically Signed   By: Rosalie Gums M.D.   On: 07/08/2014 20:23   Ct Head Wo Contrast  07/08/2014   CLINICAL DATA:  Altered mental status. Left-sided weakness. LEFT-sided neurologic deficits. Initial encounter.  EXAM: CT HEAD WITHOUT CONTRAST  TECHNIQUE: Contiguous axial images were obtained from the base of the skull through the vertex without intravenous contrast.  COMPARISON:  06/13/2011.  FINDINGS: No mass lesion, mass effect, midline shift, hydrocephalus, hemorrhage. No acute territorial cortical ischemia/infarct. Atrophy and chronic ischemic white matter disease is present. Chronic LEFT frontal encephalomalacia. Obliquely oriented low attenuation in the RIGHT cerebellar hemisphere is compatible with a prominent fissure. The calvarium appears intact. Scout images are within  normal limits.  IMPRESSION: Atrophy, chronic ischemic white matter disease and LEFT frontal encephalomalacia without acute intracranial abnormality. No interval change.   Electronically Signed   By: Andreas Newport M.D.   On: 07/08/2014 20:33   Mr Brain Wo Contrast  07/08/2014   CLINICAL DATA:  TIA, left-sided weakness  EXAM: MRI HEAD WITHOUT CONTRAST  MRA HEAD WITHOUT CONTRAST  TECHNIQUE: Multiplanar, multiecho pulse sequences of the brain and surrounding structures were obtained without intravenous contrast. Angiographic images of the head were obtained using MRA technique without contrast.  COMPARISON:  Prior CT from earlier the same day as well as previous MRI from 12/26/2010  FINDINGS: MRI HEAD FINDINGS  Diffuse prominence of the CSF containing spaces is compatible with generalized cerebral atrophy. Extensive encephalomalacia within the left frontal lobe is stable as compared to prior study, compatible with remote infarct. Remote right PCA territory infarct and right cerebellar infarcts noted. There are remote infarcts involving the posterior left temporal lobe as well as likely the right parietal lobe. Remote lacunar infarcts involving the bilateral basal ganglia are present, left greater than right.  Scattered and confluent T2/FLAIR hyperintensity within the periventricular and deep white matter both cerebral hemispheres is most compatible with chronic small vessel disease.  No mass lesion, midline shift, or extra-axial fluid collection. Ventricles are normal in size without evidence of hydrocephalus.  No diffusion-weighted signal abnormality is identified to suggest acute intracranial infarct. Gray-white matter differentiation is maintained. Normal flow voids are seen within the intracranial vasculature. No intracranial hemorrhage identified. Evidence of hemosiderin deposition seen overlying the right cerebral hemisphere.  The cervicomedullary junction is normal. Pituitary gland is within normal limits.  Pituitary stalk is midline. The globes and optic nerves demonstrate a normal appearance with normal signal intensity. The  The bone marrow signal intensity is normal. Calvarium is intact. Visualized upper cervical spine is within normal limits.  Scalp soft tissues are unremarkable.  Paranasal sinuses are clear.  No mastoid effusion.  MRA HEAD FINDINGS  ANTERIOR CIRCULATION:  Visualized portion of the distal cervical right ICA is widely patent without hemodynamically significant stenosis. On the right, mild multi focal atherosclerotic irregularity seen within the petrous, cavernous and supra clinoid right ICA without hemodynamically significant stenosis. The right M1 segment is widely patent with opacification of the distal right MCA branch vessels.  On the left, the visualized distal cervical segment of the left ICA is grossly patent. Extensive multi focal atherosclerotic irregularity with stenoses seen within the petrous and cavernous left ICA with diminished flow void. Supra clinoid left ICA grossly patent. There is proximal branch occlusion of the right left M1 segment, likely chronic in nature given the lack of acute infarct in the left MCA distribution. The left MCA branch vessels are on opacified.  A1 segment is hypoplastic. Anterior communicating artery is normal. Anterior cerebral arteries grossly patent.  POSTERIOR CIRCULATION:  Vertebral arteries are codominant and widely patent without hemodynamically significant  stenosis. The origins of the posterior inferior cerebellar arteries are patent. Vertebrobasilar junction and basilar artery within normal limits. The right posterior cerebral artery is well opacified to its distal aspect. The proximal and mid left posterior cerebral artery is well opacified, although appears occluded distally. Superior cerebral arteries grossly patent.  No aneurysm identified within the intracranial circulation.  IMPRESSION: MRI HEAD IMPRESSION:  1. No acute intracranial infarct  or other abnormality identified. 2. 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 3. Advanced atrophy with chronic small vessel ischemic disease.  MRA HEAD IMPRESSION:  1. 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 2. Multi focal atherosclerotic irregularity and stenoses within the petrous and cavernous segments of the left ICA. 3. Probable occlusion of the distal left posterior cerebral artery.   Electronically Signed   By: Rise Mu M.D.   On: 07/08/2014 23:39   Mr Maxine Glenn Head/brain Wo Cm  07/08/2014   CLINICAL DATA:  TIA, left-sided weakness  EXAM: MRI HEAD WITHOUT CONTRAST  MRA HEAD WITHOUT CONTRAST  TECHNIQUE: Multiplanar, multiecho pulse sequences of the brain and surrounding structures were obtained without intravenous contrast. Angiographic images of the head were obtained using MRA technique without contrast.  COMPARISON:  Prior CT from earlier the same day as well as previous MRI from 12/26/2010  FINDINGS: MRI HEAD FINDINGS  Diffuse prominence of the CSF containing spaces is compatible with generalized cerebral atrophy. Extensive encephalomalacia within the left frontal lobe is stable as compared to prior study, compatible with remote infarct. Remote right PCA territory infarct and right cerebellar infarcts noted. There are remote infarcts involving the posterior left temporal lobe as well as likely the right parietal lobe. Remote lacunar infarcts involving the bilateral basal ganglia are present, left greater than right.  Scattered and confluent T2/FLAIR hyperintensity within the periventricular and deep white matter both cerebral hemispheres is most compatible with chronic small vessel disease.  No mass lesion, midline shift, or extra-axial fluid collection. Ventricles are normal in size without evidence of hydrocephalus.  No diffusion-weighted signal abnormality is identified to suggest acute intracranial infarct. Gray-white  matter differentiation is maintained. Normal flow voids are seen within the intracranial vasculature. No intracranial hemorrhage identified. Evidence of hemosiderin deposition seen overlying the right cerebral hemisphere.  The cervicomedullary junction is normal. Pituitary gland is within normal limits. Pituitary stalk is midline. The globes and optic nerves demonstrate a normal appearance with normal signal intensity. The  The bone marrow signal intensity is normal. Calvarium is intact. Visualized upper cervical spine is within normal limits.  Scalp soft tissues are unremarkable.  Paranasal sinuses are clear.  No mastoid effusion.  MRA HEAD FINDINGS  ANTERIOR CIRCULATION:  Visualized portion of the distal cervical right ICA is widely patent without hemodynamically significant stenosis. On the right, mild multi focal atherosclerotic irregularity seen within the petrous, cavernous and supra clinoid right ICA without hemodynamically significant stenosis. The right M1 segment is widely patent with opacification of the distal right MCA branch vessels.  On the left, the visualized distal cervical segment of the left ICA is grossly patent. Extensive multi focal atherosclerotic irregularity with stenoses seen within the petrous and cavernous left ICA with diminished flow void. Supra clinoid left ICA grossly patent. There is proximal branch occlusion of the right left M1 segment, likely chronic in nature given the lack of acute infarct in the left MCA distribution. The left MCA branch vessels are on opacified.  A1 segment  is hypoplastic. Anterior communicating artery is normal. Anterior cerebral arteries grossly patent.  POSTERIOR CIRCULATION:  Vertebral arteries are codominant and widely patent without hemodynamically significant stenosis. The origins of the posterior inferior cerebellar arteries are patent. Vertebrobasilar junction and basilar artery within normal limits. The right posterior cerebral artery is well  opacified to its distal aspect. The proximal and mid left posterior cerebral artery is well opacified, although appears occluded distally. Superior cerebral arteries grossly patent.  No aneurysm identified within the intracranial circulation.  IMPRESSION: MRI HEAD IMPRESSION:  1. No acute intracranial infarct or other abnormality identified. 2. 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 3. Advanced atrophy with chronic small vessel ischemic disease.  MRA HEAD IMPRESSION:  1. 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 2. Multi focal atherosclerotic irregularity and stenoses within the petrous and cavernous segments of the left ICA. 3. Probable occlusion of the distal left posterior cerebral artery.   Electronically Signed   By: Rise Mu M.D.   On: 07/08/2014 23:39    Scheduled Meds: .  stroke: mapping our early stages of recovery book   Does not apply Once  . aspirin  325 mg Oral Daily  . atenolol  25 mg Oral Daily  . clopidogrel  75 mg Oral Daily  . heparin  5,000 Units Subcutaneous 3 times per day  . insulin aspart  0-15 Units Subcutaneous TID WC  . insulin aspart  0-5 Units Subcutaneous QHS  . lisinopril  5 mg Oral Daily  . pantoprazole  40 mg Oral Daily   Continuous Infusions:   Principal Problem:   Left leg weakness Active Problems:   Hypertension   H/O: CVA (cerebrovascular accident)   H/O subdural hemorrhage   DM type 2 (diabetes mellitus, type 2)  Time spent:  Leslye Puccini K  [facility] Pager [phone]. If 7PM-7AM, please contact night-coverage at [email or web address], password [facility] 07/10/2014, 12:45 PM  LOS: 2 days

## 2014-07-10 NOTE — Progress Notes (Signed)
Subjective: Patient states she no longer has any weakness or decreased sensation on the left. Stating she feels back to her normal self.   Objective: Current vital signs: BP 126/70  Pulse 66  Temp(Src) 97.8 F (36.6 C) (Oral)  Resp 16  Ht 5\' 2"  (1.575 m)  Wt 88.455 kg (195 lb 0.1 oz)  BMI 35.66 kg/m2  SpO2 99% Vital signs in last 24 hours: Temp:  [97.8 F (36.6 C)-99.2 F (37.3 C)] 97.8 F (36.6 C) (08/19 0734) Pulse Rate:  [66-85] 66 (08/19 0734) Resp:  [16-18] 16 (08/19 0734) BP: (113-128)/(54-70) 126/70 mmHg (08/19 0734) SpO2:  [99 %-100 %] 99 % (08/19 0734) Weight:  [88.455 kg (195 lb 0.1 oz)] 88.455 kg (195 lb 0.1 oz) (08/19 0000)  Intake/Output from previous day: 08/18 0701 - 08/19 0700 In: 0  Out: 650 [Urine:650] Intake/Output this shift:   Nutritional status: Carb Control  Neurologic Exam: General: NAD Mental Status: Alert, oriented, thought content appropriate.  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  Able to follow 3 step commands without difficulty. Cranial Nerves: II: Visual fields grossly normal, pupils equal, round, reactive to light and accommodation III,IV, VI: ptosis not present, extra-ocular motions intact bilaterally V,VII: smile symmetric, facial light touch sensation normal bilaterally VIII: hearing normal bilaterally IX,X: gag reflex present XI: bilateral shoulder shrug XII: midline tongue extension without atrophy or fasciculations  Motor: Right : Upper extremity   5/5    Left:     Upper extremity   5/5  Lower extremity   5/5     Lower extremity   5/5 Tone and bulk:normal tone throughout; no atrophy noted Sensory: Pinprick and light touch intact throughout, bilaterally Deep Tendon Reflexes:  Right: Upper Extremity   Left: Upper extremity   biceps (C-5 to C-6) 2/4   biceps (C-5 to C-6) 2/4 tricep (C7) 2/4    triceps (C7) 2/4 Brachioradialis (C6) 2/4  Brachioradialis (C6) 2/4  Lower Extremity Lower Extremity  quadriceps  (L-2 to L-4) 2/4   quadriceps (L-2 to L-4) 2/4 Achilles (S1) 2/4   Achilles (S1) 2/4  Plantars: Right: downgoing   Left: downgoing    Lab Results: Basic Metabolic Panel:  Recent Labs Lab 07/08/14 1956  NA 143  K 4.5  CL 105  CO2 26  GLUCOSE 164*  BUN 12  CREATININE 1.10  CALCIUM 9.2    Liver Function Tests:  Recent Labs Lab 07/08/14 1956  AST 15  ALT 13  ALKPHOS 81  BILITOT 0.4  PROT 6.8  ALBUMIN 3.6   No results found for this basename: LIPASE, AMYLASE,  in the last 168 hours  Recent Labs Lab 07/09/14 0910  AMMONIA 49    CBC:  Recent Labs Lab 07/08/14 1956  WBC 9.0  HGB 13.0  HCT 39.4  MCV 84.4  PLT 213    Cardiac Enzymes: No results found for this basename: CKTOTAL, CKMB, CKMBINDEX, TROPONINI,  in the last 168 hours  Lipid Panel: No results found for this basename: CHOL, TRIG, HDL, CHOLHDL, VLDL, LDLCALC,  in the last 168 hours  CBG:  Recent Labs Lab 07/09/14 0724 07/09/14 1133 07/09/14 1657 07/09/14 2100 07/10/14 0733  GLUCAP 182* 148* 134* 211* 202*    Microbiology: Results for orders placed during the hospital encounter of 08/15/13  URINE CULTURE     Status: None   Collection Time    08/16/13 12:24 AM      Result Value Ref Range Status   Specimen Description URINE, CLEAN  CATCH   Final   Special Requests CX ADDED AT 0047 ON [id number]   Final   Culture  Setup Time     Final   Value: 08/16/2013 01:24     Performed at [facility]   Colony Count     Final   Value: 9,000 COLONIES/ML     Performed at [facility]   Culture     Final   Value: INSIGNIFICANT GROWTH     Performed at [facility]   Report Status 08/17/2013 FINAL   Final    Coagulation Studies: No results found for this basename: LABPROT, INR,  in the last 72 hours  Imaging: Dg Chest 2 View  07/08/2014   CLINICAL DATA:  Altered mental status and weakness.  EXAM: CHEST  2 VIEW  COMPARISON:  06/13/2011  FINDINGS: The patient is rotated  towards the left. There are no focal consolidations or pleural effusions. No pulmonary edema.  IMPRESSION: No active cardiopulmonary disease.   Electronically Signed   By: Rosalie GumsBeth  Brown M.D.   On: 07/08/2014 20:23   Ct Head Wo Contrast  07/08/2014   CLINICAL DATA:  Altered mental status. Left-sided weakness. LEFT-sided neurologic deficits. Initial encounter.  EXAM: CT HEAD WITHOUT CONTRAST  TECHNIQUE: Contiguous axial images were obtained from the base of the skull through the vertex without intravenous contrast.  COMPARISON:  06/13/2011.  FINDINGS: No mass lesion, mass effect, midline shift, hydrocephalus, hemorrhage. No acute territorial cortical ischemia/infarct. Atrophy and chronic ischemic white matter disease is present. Chronic LEFT frontal encephalomalacia. Obliquely oriented low attenuation in the RIGHT cerebellar hemisphere is compatible with a prominent fissure. The calvarium appears intact. Scout images are within normal limits.  IMPRESSION: Atrophy, chronic ischemic white matter disease and LEFT frontal encephalomalacia without acute intracranial abnormality. No interval change.   Electronically Signed   By: Andreas NewportGeoffrey  Lamke M.D.   On: 07/08/2014 20:33   Mr Brain Wo Contrast  07/08/2014   CLINICAL DATA:  TIA, left-sided weakness  EXAM: MRI HEAD WITHOUT CONTRAST  MRA HEAD WITHOUT CONTRAST  TECHNIQUE: Multiplanar, multiecho pulse sequences of the brain and surrounding structures were obtained without intravenous contrast. Angiographic images of the head were obtained using MRA technique without contrast.  COMPARISON:  Prior CT from earlier the same day as well as previous MRI from 12/26/2010  FINDINGS: MRI HEAD FINDINGS  Diffuse prominence of the CSF containing spaces is compatible with generalized cerebral atrophy. Extensive encephalomalacia within the left frontal lobe is stable as compared to prior study, compatible with remote infarct. Remote right PCA territory infarct and right cerebellar  infarcts noted. There are remote infarcts involving the posterior left temporal lobe as well as likely the right parietal lobe. Remote lacunar infarcts involving the bilateral basal ganglia are present, left greater than right.  Scattered and confluent T2/FLAIR hyperintensity within the periventricular and deep white matter both cerebral hemispheres is most compatible with chronic small vessel disease.  No mass lesion, midline shift, or extra-axial fluid collection. Ventricles are normal in size without evidence of hydrocephalus.  No diffusion-weighted signal abnormality is identified to suggest acute intracranial infarct. Gray-white matter differentiation is maintained. Normal flow voids are seen within the intracranial vasculature. No intracranial hemorrhage identified. Evidence of hemosiderin deposition seen overlying the right cerebral hemisphere.  The cervicomedullary junction is normal. Pituitary gland is within normal limits. Pituitary stalk is midline. The globes and optic nerves demonstrate a normal appearance with normal signal intensity. The  The bone marrow signal intensity is  normal. Calvarium is intact. Visualized upper cervical spine is within normal limits.  Scalp soft tissues are unremarkable.  Paranasal sinuses are clear.  No mastoid effusion.  MRA HEAD FINDINGS  ANTERIOR CIRCULATION:  Visualized portion of the distal cervical right ICA is widely patent without hemodynamically significant stenosis. On the right, mild multi focal atherosclerotic irregularity seen within the petrous, cavernous and supra clinoid right ICA without hemodynamically significant stenosis. The right M1 segment is widely patent with opacification of the distal right MCA branch vessels.  On the left, the visualized distal cervical segment of the left ICA is grossly patent. Extensive multi focal atherosclerotic irregularity with stenoses seen within the petrous and cavernous left ICA with diminished flow void. Supra clinoid  left ICA grossly patent. There is proximal branch occlusion of the right left M1 segment, likely chronic in nature given the lack of acute infarct in the left MCA distribution. The left MCA branch vessels are on opacified.  A1 segment is hypoplastic. Anterior communicating artery is normal. Anterior cerebral arteries grossly patent.  POSTERIOR CIRCULATION:  Vertebral arteries are codominant and widely patent without hemodynamically significant stenosis. The origins of the posterior inferior cerebellar arteries are patent. Vertebrobasilar junction and basilar artery within normal limits. The right posterior cerebral artery is well opacified to its distal aspect. The proximal and mid left posterior cerebral artery is well opacified, although appears occluded distally. Superior cerebral arteries grossly patent.  No aneurysm identified within the intracranial circulation.  IMPRESSION: MRI HEAD IMPRESSION:  1. No acute intracranial infarct or other abnormality identified. 2. 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 3. Advanced atrophy with chronic small vessel ischemic disease.  MRA HEAD IMPRESSION:  1. 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 2. Multi focal atherosclerotic irregularity and stenoses within the petrous and cavernous segments of the left ICA. 3. Probable occlusion of the distal left posterior cerebral artery.   Electronically Signed   By: Rise Mu M.D.   On: 07/08/2014 23:39   Mr Maxine Glenn Head/brain Wo Cm  07/08/2014   CLINICAL DATA:  TIA, left-sided weakness  EXAM: MRI HEAD WITHOUT CONTRAST  MRA HEAD WITHOUT CONTRAST  TECHNIQUE: Multiplanar, multiecho pulse sequences of the brain and surrounding structures were obtained without intravenous contrast. Angiographic images of the head were obtained using MRA technique without contrast.  COMPARISON:  Prior CT from earlier the same day as well as previous MRI from 12/26/2010   FINDINGS: MRI HEAD FINDINGS  Diffuse prominence of the CSF containing spaces is compatible with generalized cerebral atrophy. Extensive encephalomalacia within the left frontal lobe is stable as compared to prior study, compatible with remote infarct. Remote right PCA territory infarct and right cerebellar infarcts noted. There are remote infarcts involving the posterior left temporal lobe as well as likely the right parietal lobe. Remote lacunar infarcts involving the bilateral basal ganglia are present, left greater than right.  Scattered and confluent T2/FLAIR hyperintensity within the periventricular and deep white matter both cerebral hemispheres is most compatible with chronic small vessel disease.  No mass lesion, midline shift, or extra-axial fluid collection. Ventricles are normal in size without evidence of hydrocephalus.  No diffusion-weighted signal abnormality is identified to suggest acute intracranial infarct. Gray-white matter differentiation is maintained. Normal flow voids are seen within the intracranial vasculature. No intracranial hemorrhage identified. Evidence of hemosiderin deposition seen overlying the right cerebral hemisphere.  The cervicomedullary junction is normal. Pituitary gland is within normal  limits. Pituitary stalk is midline. The globes and optic nerves demonstrate a normal appearance with normal signal intensity. The  The bone marrow signal intensity is normal. Calvarium is intact. Visualized upper cervical spine is within normal limits.  Scalp soft tissues are unremarkable.  Paranasal sinuses are clear.  No mastoid effusion.  MRA HEAD FINDINGS  ANTERIOR CIRCULATION:  Visualized portion of the distal cervical right ICA is widely patent without hemodynamically significant stenosis. On the right, mild multi focal atherosclerotic irregularity seen within the petrous, cavernous and supra clinoid right ICA without hemodynamically significant stenosis. The right M1 segment is widely  patent with opacification of the distal right MCA branch vessels.  On the left, the visualized distal cervical segment of the left ICA is grossly patent. Extensive multi focal atherosclerotic irregularity with stenoses seen within the petrous and cavernous left ICA with diminished flow void. Supra clinoid left ICA grossly patent. There is proximal branch occlusion of the right left M1 segment, likely chronic in nature given the lack of acute infarct in the left MCA distribution. The left MCA branch vessels are on opacified.  A1 segment is hypoplastic. Anterior communicating artery is normal. Anterior cerebral arteries grossly patent.  POSTERIOR CIRCULATION:  Vertebral arteries are codominant and widely patent without hemodynamically significant stenosis. The origins of the posterior inferior cerebellar arteries are patent. Vertebrobasilar junction and basilar artery within normal limits. The right posterior cerebral artery is well opacified to its distal aspect. The proximal and mid left posterior cerebral artery is well opacified, although appears occluded distally. Superior cerebral arteries grossly patent.  No aneurysm identified within the intracranial circulation.  IMPRESSION: MRI HEAD IMPRESSION:  1. No acute intracranial infarct or other abnormality identified. 2. 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 3. Advanced atrophy with chronic small vessel ischemic disease.  MRA HEAD IMPRESSION:  1. 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 2. Multi focal atherosclerotic irregularity and stenoses within the petrous and cavernous segments of the left ICA. 3. Probable occlusion of the distal left posterior cerebral artery.   Electronically Signed   By: Rise Mu M.D.   On: 07/08/2014 23:39    Medications:  Scheduled: .  stroke: mapping our early stages of recovery book   Does not apply Once  . aspirin  325 mg Oral Daily  .  atenolol  25 mg Oral Daily  . clopidogrel  75 mg Oral Daily  . heparin  5,000 Units Subcutaneous 3 times per day  . insulin aspart  0-15 Units Subcutaneous TID WC  . insulin aspart  0-5 Units Subcutaneous QHS  . lisinopril  5 mg Oral Daily  . pantoprazole  40 mg Oral Daily    Assessment/Plan:  68 y.o. female with 4 days of left sided decreased sensation and weakness. The decreased sensations includes the face along with the arm and leg. MRI brain has been obtained which shows no acute intracranial abnormality. Today her symptoms have fully resolved and patient has no complaints. She is awaiting MRI C-spine and EEG. Given her symptoms have resolved, if these are normal no further recommendations per neurology. Will follow results.    Felicie Morn PA-C [facility] [phone]  07/10/2014, 10:13 AM

## 2014-07-11 ENCOUNTER — Observation Stay (HOSPITAL_COMMUNITY): Payer: Medicare Other

## 2014-07-11 LAB — RAPID URINE DRUG SCREEN, HOSP PERFORMED
Amphetamines: NOT DETECTED
BARBITURATES: NOT DETECTED
Benzodiazepines: NOT DETECTED
Cocaine: NOT DETECTED
Opiates: NOT DETECTED
TETRAHYDROCANNABINOL: NOT DETECTED

## 2014-07-11 LAB — GLUCOSE, CAPILLARY
GLUCOSE-CAPILLARY: 185 mg/dL — AB (ref 70–99)
GLUCOSE-CAPILLARY: 194 mg/dL — AB (ref 70–99)

## 2014-07-11 MED ORDER — PANTOPRAZOLE SODIUM 40 MG PO TBEC: 40.0000 mg | DELAYED_RELEASE_TABLET | Freq: Every day | ORAL | Status: DC

## 2014-07-11 MED ORDER — ATENOLOL 25 MG PO TABS: 25.0000 mg | ORAL_TABLET | Freq: Every day | ORAL | Status: DC

## 2014-07-11 MED ORDER — METFORMIN HCL 500 MG PO TABS: 500.0000 mg | ORAL_TABLET | Freq: Two times a day (BID) | ORAL | Status: DC

## 2014-07-11 MED ORDER — CLOPIDOGREL BISULFATE 75 MG PO TABS: 75.0000 mg | ORAL_TABLET | Freq: Every day | ORAL | Status: DC

## 2014-07-11 MED ORDER — ATORVASTATIN CALCIUM 10 MG PO TABS: 10.0000 mg | ORAL_TABLET | Freq: Every day | ORAL | Status: DC

## 2014-07-11 MED ORDER — LISINOPRIL 5 MG PO TABS: 5.0000 mg | ORAL_TABLET | Freq: Every day | ORAL | Status: DC

## 2014-07-11 NOTE — Discharge Summary (Addendum)
Physician Discharge Summary  [name] ZOX:[id number] DOB: September 11, 1946 DOA: 07/08/2014  PCP: Dorrene German, MD  Admit date: 07/08/2014 Discharge date: 07/11/2014  Time spent: 35 minutes  Recommendations for Outpatient Follow-up:  1. Follow up with PCP in 1-2 weeks  Discharge Diagnoses:  Principal Problem:   Left leg weakness Active Problems:   Hypertension   H/O: CVA (cerebrovascular accident)   H/O subdural hemorrhage   DM type 2 (diabetes mellitus, type 2)   Discharge Condition: Stable  Diet recommendation: Diabetic  Filed Weights   07/09/14 0037 07/10/14 0000 07/11/14 0500  Weight: 88.225 kg (194 lb 8 oz) 88.455 kg (195 lb 0.1 oz) 88.6 kg (195 lb 5.2 oz)    History of present illness:  Please see admit h and p from 8/17 for details. Briefly, pt presents with L sided weakness. Neurology was consulted for further recs.  Hospital Course:  Left leg weakness/arm: MRI shows only old infarcts. MRA multivessel occlusion, atherosclerosis. Per my exam, pt reported continued L sided weakness. However, pt's exam revealed resolved weakness. MRI cervical spine was unremarkable. Pt's sx were largely resolved and pt Neurology recommends no further work up. Pt to follow up with PCP in 1-2 weeks.  Hypertension  BP stable and controlled   H/O: CVA (cerebrovascular accident)  No acute CVA on MRI   H/O subdural hemorrhage  No hemorrhage on imaging noted   DM type 2 (diabetes mellitus, type 2)  Glucose has remained stable thus far  Consultations:  Neurology  Discharge Exam: Filed Vitals:   07/10/14 2351 07/11/14 0500 07/11/14 0807 07/11/14 1002  BP: 120/39 123/55 143/24 110/60  Pulse: 67 73 62   Temp: 98.1 F (36.7 C) 98.4 F (36.9 C) 98.4 F (36.9 C)   TempSrc: Oral Oral Oral   Resp: 18 18 17    Height:      Weight:  88.6 kg (195 lb 5.2 oz)    SpO2: 99% 99% 100%     General: Awake, in nad Cardiovascular: regular, s1, s2 Respiratory: normal resp effort, no  wheezing  Discharge Instructions     Medication List         acetaminophen 325 MG tablet  Commonly known as:  TYLENOL  Take 650 mg by mouth every 6 (six) hours as needed for pain.     aspirin 81 MG tablet  Take 81 mg by mouth daily.     atenolol 25 MG tablet  Commonly known as:  TENORMIN  Take 1 tablet (25 mg total) by mouth daily.     atorvastatin 10 MG tablet  Commonly known as:  LIPITOR  Take 1 tablet (10 mg total) by mouth daily.     clopidogrel 75 MG tablet  Commonly known as:  PLAVIX  Take 75 mg by mouth daily.     lisinopril 5 MG tablet  Commonly known as:  PRINIVIL,ZESTRIL  Take 5 mg by mouth daily.     metFORMIN 500 MG tablet  Commonly known as:  GLUCOPHAGE  Take 500 mg by mouth 2 (two) times daily with a meal.     omeprazole 20 MG capsule  Commonly known as:  PRILOSEC  Take 1 capsule (20 mg total) by mouth daily.       Allergies  Allergen Reactions  . Penicillins Itching   Follow-up Information   Follow up with AVBUERE,EDWIN A, MD. Schedule an appointment as soon as possible for a visit in 1 week.   Specialty:  Internal Medicine   Contact information:  [address] [place] [address] [phone]       Follow up with [facility]. (Physical and Occupational Therapy)    Contact information:   [address] [place] [address] [phone]        The results of significant diagnostics from this hospitalization (including imaging, microbiology, ancillary and laboratory) are listed below for reference.    Significant Diagnostic Studies: Dg Chest 2 View  07/08/2014   CLINICAL DATA:  Altered mental status and weakness.  EXAM: CHEST  2 VIEW  COMPARISON:  06/13/2011  FINDINGS: The patient is rotated towards the left. There are no focal consolidations or pleural effusions. No pulmonary edema.  IMPRESSION: No active cardiopulmonary disease.   Electronically Signed   By: Rosalie Gums M.D.   On: 07/08/2014 20:23    Ct Head Wo Contrast  07/08/2014   CLINICAL DATA:  Altered mental status. Left-sided weakness. LEFT-sided neurologic deficits. Initial encounter.  EXAM: CT HEAD WITHOUT CONTRAST  TECHNIQUE: Contiguous axial images were obtained from the base of the skull through the vertex without intravenous contrast.  COMPARISON:  06/13/2011.  FINDINGS: No mass lesion, mass effect, midline shift, hydrocephalus, hemorrhage. No acute territorial cortical ischemia/infarct. Atrophy and chronic ischemic white matter disease is present. Chronic LEFT frontal encephalomalacia. Obliquely oriented low attenuation in the RIGHT cerebellar hemisphere is compatible with a prominent fissure. The calvarium appears intact. Scout images are within normal limits.  IMPRESSION: Atrophy, chronic ischemic white matter disease and LEFT frontal encephalomalacia without acute intracranial abnormality. No interval change.   Electronically Signed   By: Andreas Newport M.D.   On: 07/08/2014 20:33   Mr Brain Wo Contrast  07/08/2014   CLINICAL DATA:  TIA, left-sided weakness  EXAM: MRI HEAD WITHOUT CONTRAST  MRA HEAD WITHOUT CONTRAST  TECHNIQUE: Multiplanar, multiecho pulse sequences of the brain and surrounding structures were obtained without intravenous contrast. Angiographic images of the head were obtained using MRA technique without contrast.  COMPARISON:  Prior CT from earlier the same day as well as previous MRI from 12/26/2010  FINDINGS: MRI HEAD FINDINGS  Diffuse prominence of the CSF containing spaces is compatible with generalized cerebral atrophy. Extensive encephalomalacia within the left frontal lobe is stable as compared to prior study, compatible with remote infarct. Remote right PCA territory infarct and right cerebellar infarcts noted. There are remote infarcts involving the posterior left temporal lobe as well as likely the right parietal lobe. Remote lacunar infarcts involving the bilateral basal ganglia are present, left greater  than right.  Scattered and confluent T2/FLAIR hyperintensity within the periventricular and deep white matter both cerebral hemispheres is most compatible with chronic small vessel disease.  No mass lesion, midline shift, or extra-axial fluid collection. Ventricles are normal in size without evidence of hydrocephalus.  No diffusion-weighted signal abnormality is identified to suggest acute intracranial infarct. Gray-white matter differentiation is maintained. Normal flow voids are seen within the intracranial vasculature. No intracranial hemorrhage identified. Evidence of hemosiderin deposition seen overlying the right cerebral hemisphere.  The cervicomedullary junction is normal. Pituitary gland is within normal limits. Pituitary stalk is midline. The globes and optic nerves demonstrate a normal appearance with normal signal intensity. The  The bone marrow signal intensity is normal. Calvarium is intact. Visualized upper cervical spine is within normal limits.  Scalp soft tissues are unremarkable.  Paranasal sinuses are clear.  No mastoid effusion.  MRA HEAD FINDINGS  ANTERIOR CIRCULATION:  Visualized portion of the distal cervical right ICA is widely patent  without hemodynamically significant stenosis. On the right, mild multi focal atherosclerotic irregularity seen within the petrous, cavernous and supra clinoid right ICA without hemodynamically significant stenosis. The right M1 segment is widely patent with opacification of the distal right MCA branch vessels.  On the left, the visualized distal cervical segment of the left ICA is grossly patent. Extensive multi focal atherosclerotic irregularity with stenoses seen within the petrous and cavernous left ICA with diminished flow void. Supra clinoid left ICA grossly patent. There is proximal branch occlusion of the right left M1 segment, likely chronic in nature given the lack of acute infarct in the left MCA distribution. The left MCA branch vessels are on  opacified.  A1 segment is hypoplastic. Anterior communicating artery is normal. Anterior cerebral arteries grossly patent.  POSTERIOR CIRCULATION:  Vertebral arteries are codominant and widely patent without hemodynamically significant stenosis. The origins of the posterior inferior cerebellar arteries are patent. Vertebrobasilar junction and basilar artery within normal limits. The right posterior cerebral artery is well opacified to its distal aspect. The proximal and mid left posterior cerebral artery is well opacified, although appears occluded distally. Superior cerebral arteries grossly patent.  No aneurysm identified within the intracranial circulation.  IMPRESSION: MRI HEAD IMPRESSION:  1. No acute intracranial infarct or other abnormality identified. 2. 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 3. Advanced atrophy with chronic small vessel ischemic disease.  MRA HEAD IMPRESSION:  1. 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 2. Multi focal atherosclerotic irregularity and stenoses within the petrous and cavernous segments of the left ICA. 3. Probable occlusion of the distal left posterior cerebral artery.   Electronically Signed   By: Rise Mu M.D.   On: 07/08/2014 23:39   Mr Cervical Spine Wo Contrast  07/11/2014   CLINICAL DATA:  Left-sided weakness  EXAM: MRI CERVICAL SPINE WITHOUT CONTRAST  TECHNIQUE: Multiplanar, multisequence MR imaging of the cervical spine was performed. No intravenous contrast was administered.  COMPARISON:  CT cervical spine 06/13/2011  FINDINGS: Normal cervical alignment with straightening of the cervical lordosis. Negative for fracture or mass. Craniocervical junction is normal. Spinal cord signal is normal.  C2-3:  Negative  C3-4:  Negative  C4-5:  Negative  C5-6: Mild disc degeneration and mild spondylosis without significant spinal stenosis or disc protrusion  C6-7:  Negative  C7-T1:   Negative  IMPRESSION: Mild disc degeneration and spurring C5-6 without significant spinal stenosis or neural impingement.   Electronically Signed   By: Marlan Palau M.D.   On: 07/11/2014 08:16   Mr Maxine Glenn Head/brain Wo Cm  07/08/2014   CLINICAL DATA:  TIA, left-sided weakness  EXAM: MRI HEAD WITHOUT CONTRAST  MRA HEAD WITHOUT CONTRAST  TECHNIQUE: Multiplanar, multiecho pulse sequences of the brain and surrounding structures were obtained without intravenous contrast. Angiographic images of the head were obtained using MRA technique without contrast.  COMPARISON:  Prior CT from earlier the same day as well as previous MRI from 12/26/2010  FINDINGS: MRI HEAD FINDINGS  Diffuse prominence of the CSF containing spaces is compatible with generalized cerebral atrophy. Extensive encephalomalacia within the left frontal lobe is stable as compared to prior study, compatible with remote infarct. Remote right PCA territory infarct and right cerebellar infarcts noted. There are remote infarcts involving the posterior left temporal lobe as well as likely the right parietal lobe. Remote lacunar infarcts involving the bilateral basal ganglia are present, left greater than right.  Scattered  and confluent T2/FLAIR hyperintensity within the periventricular and deep white matter both cerebral hemispheres is most compatible with chronic small vessel disease.  No mass lesion, midline shift, or extra-axial fluid collection. Ventricles are normal in size without evidence of hydrocephalus.  No diffusion-weighted signal abnormality is identified to suggest acute intracranial infarct. Gray-white matter differentiation is maintained. Normal flow voids are seen within the intracranial vasculature. No intracranial hemorrhage identified. Evidence of hemosiderin deposition seen overlying the right cerebral hemisphere.  The cervicomedullary junction is normal. Pituitary gland is within normal limits. Pituitary stalk is midline. The globes and  optic nerves demonstrate a normal appearance with normal signal intensity. The  The bone marrow signal intensity is normal. Calvarium is intact. Visualized upper cervical spine is within normal limits.  Scalp soft tissues are unremarkable.  Paranasal sinuses are clear.  No mastoid effusion.  MRA HEAD FINDINGS  ANTERIOR CIRCULATION:  Visualized portion of the distal cervical right ICA is widely patent without hemodynamically significant stenosis. On the right, mild multi focal atherosclerotic irregularity seen within the petrous, cavernous and supra clinoid right ICA without hemodynamically significant stenosis. The right M1 segment is widely patent with opacification of the distal right MCA branch vessels.  On the left, the visualized distal cervical segment of the left ICA is grossly patent. Extensive multi focal atherosclerotic irregularity with stenoses seen within the petrous and cavernous left ICA with diminished flow void. Supra clinoid left ICA grossly patent. There is proximal branch occlusion of the right left M1 segment, likely chronic in nature given the lack of acute infarct in the left MCA distribution. The left MCA branch vessels are on opacified.  A1 segment is hypoplastic. Anterior communicating artery is normal. Anterior cerebral arteries grossly patent.  POSTERIOR CIRCULATION:  Vertebral arteries are codominant and widely patent without hemodynamically significant stenosis. The origins of the posterior inferior cerebellar arteries are patent. Vertebrobasilar junction and basilar artery within normal limits. The right posterior cerebral artery is well opacified to its distal aspect. The proximal and mid left posterior cerebral artery is well opacified, although appears occluded distally. Superior cerebral arteries grossly patent.  No aneurysm identified within the intracranial circulation.  IMPRESSION: MRI HEAD IMPRESSION:  1. No acute intracranial infarct or other abnormality identified. 2.  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 3. Advanced atrophy with chronic small vessel ischemic disease.  MRA HEAD IMPRESSION:  1. 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 2. Multi focal atherosclerotic irregularity and stenoses within the petrous and cavernous segments of the left ICA. 3. Probable occlusion of the distal left posterior cerebral artery.   Electronically Signed   By: Rise Mu M.D.   On: 07/08/2014 23:39    Microbiology: No results found for this or any previous visit (from the past 240 hour(s)).   Labs: Basic Metabolic Panel:  Recent Labs Lab 07/08/14 1956  NA 143  K 4.5  CL 105  CO2 26  GLUCOSE 164*  BUN 12  CREATININE 1.10  CALCIUM 9.2   Liver Function Tests:  Recent Labs Lab 07/08/14 1956  AST 15  ALT 13  ALKPHOS 81  BILITOT 0.4  PROT 6.8  ALBUMIN 3.6   No results found for this basename: LIPASE, AMYLASE,  in the last 168 hours  Recent Labs Lab 07/09/14 0910  AMMONIA 49   CBC:  Recent Labs Lab 07/08/14 1956  WBC 9.0  HGB 13.0  HCT 39.4  MCV 84.4  PLT 213   Cardiac Enzymes: No results found for this basename: CKTOTAL, CKMB, CKMBINDEX, TROPONINI,  in the last 168 hours BNP: BNP (last 3 results) No results found for this basename: PROBNP,  in the last 8760 hours CBG:  Recent Labs Lab 07/10/14 1119 07/10/14 1455 07/10/14 1633 07/10/14 2111 07/11/14 0807  GLUCAP 155* 135* 184* 125* 185*    Signed:  Laverle Pillard K  [facility] 07/11/2014, 11:43 AM

## 2014-07-11 NOTE — Progress Notes (Signed)
Subjective: Continues to feel back to normal.   Objective: Current vital signs: BP 143/24  Pulse 62  Temp(Src) 98.4 F (36.9 C) (Oral)  Resp 17  Ht 5\' 2"  (1.575 m)  Wt 88.6 kg (195 lb 5.2 oz)  BMI 35.72 kg/m2  SpO2 100% Vital signs in last 24 hours: Temp:  [98.1 F (36.7 C)-98.5 F (36.9 C)] 98.4 F (36.9 C) (08/20 0807) Pulse Rate:  [58-73] 62 (08/20 0807) Resp:  [16-18] 17 (08/20 0807) BP: (119-143)/(24-79) 143/24 mmHg (08/20 0807) SpO2:  [99 %-100 %] 100 % (08/20 0807) Weight:  [88.6 kg (195 lb 5.2 oz)] 88.6 kg (195 lb 5.2 oz) (08/20 0500)  Intake/Output from previous day: 08/19 0701 - 08/20 0700 In: 720 [P.O.:720] Out: 350 [Urine:350] Intake/Output this shift:   Nutritional status: Carb Control  Neurologic Exam: General: NAD Mental Status: Alert, oriented, thought content appropriate.  Speech fluent without evidence of aphasia.  Able to follow 3 step commands without difficulty. Cranial Nerves: II: Discs flat bilaterally; Visual fields grossly normal, pupils equal, round, reactive to light and accommodation III,IV, VI: ptosis not present, extra-ocular motions intact bilaterally V,VII: smile symmetric, facial light touch sensation normal bilaterally VIII: hearing normal bilaterally IX,X: gag reflex present XI: bilateral shoulder shrug XII: midline tongue extension without atrophy or fasciculations  Motor: Right : Upper extremity   5/5    Left:     Upper extremity   5/5  Lower extremity   5/5     Lower extremity   5/5 Tone and bulk:normal tone throughout; no atrophy noted Sensory: Pinprick and light touch intact throughout, bilaterally Deep Tendon Reflexes:  Right: Upper Extremity   Left: Upper extremity   biceps (C-5 to C-6) 2/4   biceps (C-5 to C-6) 2/4 tricep (C7) 2/4    triceps (C7) 2/4 Brachioradialis (C6) 2/4  Brachioradialis (C6) 2/4  Lower Extremity Lower Extremity  quadriceps (L-2 to L-4) 2/4   quadriceps (L-2 to L-4) 2/4 Achilles (S1)  1/4   Achilles (S1) 1/4  Plantars: Right: downgoing   Left: downgoing    Lab Results: Basic Metabolic Panel:  Recent Labs Lab 07/08/14 1956  NA 143  K 4.5  CL 105  CO2 26  GLUCOSE 164*  BUN 12  CREATININE 1.10  CALCIUM 9.2    Liver Function Tests:  Recent Labs Lab 07/08/14 1956  AST 15  ALT 13  ALKPHOS 81  BILITOT 0.4  PROT 6.8  ALBUMIN 3.6   No results found for this basename: LIPASE, AMYLASE,  in the last 168 hours  Recent Labs Lab 07/09/14 0910  AMMONIA 49    CBC:  Recent Labs Lab 07/08/14 1956  WBC 9.0  HGB 13.0  HCT 39.4  MCV 84.4  PLT 213    Cardiac Enzymes: No results found for this basename: CKTOTAL, CKMB, CKMBINDEX, TROPONINI,  in the last 168 hours  Lipid Panel:  Recent Labs Lab 07/08/14 1956  CHOL 265*  TRIG 239*  HDL 38*  CHOLHDL 7.0  VLDL 48*  LDLCALC 179*    CBG:  Recent Labs Lab 07/10/14 1119 07/10/14 1455 07/10/14 1633 07/10/14 2111 07/11/14 0807  GLUCAP 155* 135* 184* 125* 185*    Microbiology: Results for orders placed during the hospital encounter of 08/15/13  URINE CULTURE     Status: None   Collection Time    08/16/13 12:24 AM      Result Value Ref Range Status   Specimen Description URINE, CLEAN CATCH   Final   Special  Requests CX ADDED AT 0047 ON [id number]   Final   Culture  Setup Time     Final   Value: 08/16/2013 01:24     Performed at [facility]   Colony Count     Final   Value: 9,000 COLONIES/ML     Performed at [facility]   Culture     Final   Value: INSIGNIFICANT GROWTH     Performed at [facility]   Report Status 08/17/2013 FINAL   Final    Coagulation Studies: No results found for this basename: LABPROT, INR,  in the last 72 hours  Imaging: Mr Cervical Spine Wo Contrast  07/11/2014   CLINICAL DATA:  Left-sided weakness  EXAM: MRI CERVICAL SPINE WITHOUT CONTRAST  TECHNIQUE: Multiplanar, multisequence MR imaging of the cervical spine was performed. No  intravenous contrast was administered.  COMPARISON:  CT cervical spine 06/13/2011  FINDINGS: Normal cervical alignment with straightening of the cervical lordosis. Negative for fracture or mass. Craniocervical junction is normal. Spinal cord signal is normal.  C2-3:  Negative  C3-4:  Negative  C4-5:  Negative  C5-6: Mild disc degeneration and mild spondylosis without significant spinal stenosis or disc protrusion  C6-7:  Negative  C7-T1:  Negative  IMPRESSION: Mild disc degeneration and spurring C5-6 without significant spinal stenosis or neural impingement.   Electronically Signed   By: Marlan Palauharles  Clark M.D.   On: 07/11/2014 08:16    Medications:  Scheduled: .  stroke: mapping our early stages of recovery book   Does not apply Once  . aspirin  325 mg Oral Daily  . atenolol  25 mg Oral Daily  . clopidogrel  75 mg Oral Daily  . heparin  5,000 Units Subcutaneous 3 times per day  . insulin aspart  0-15 Units Subcutaneous TID WC  . insulin aspart  0-5 Units Subcutaneous QHS  . lisinopril  5 mg Oral Daily  . pantoprazole  40 mg Oral Daily    Assessment/Plan:  68 y.o. female with 4 days of left sided decreased sensation and weakness. The decreased sensations includes the face along with the arm and leg. MRI brain has been obtained which shows no acute intracranial abnormality. Continues to feel back to normal. MRI C-spine showing no etiology for symptoms. EEG was not ordered and at this time do not feel need to order. Symptoms have resolved.    No further neurologic intervention is recommended at this time.  If further questions arise, please call or page at that time.  Thank you for allowing neurology to participate in the care of this patient.  Felicie MornDavid Venida Tsukamoto PA-C [facility] [phone]  07/11/2014, 9:37 AM

## 2014-07-11 NOTE — Progress Notes (Signed)
Pt discharged to home per MD order. Pt and daughter received and reviewed all discharge instructions and medication information including follow-up appointments and prescription information. Pt and daughter verbalized understanding. Pt alert and oriented at discharge with no complaints of pain. Pt IV and telemetry box removed prior to discharge. Pt escorted to private vehicle via wheelchair by guest services volunteer. [name], Yaneisy Wenz C

## 2014-07-11 NOTE — Discharge Instructions (Signed)
STROKE/TIA DISCHARGE INSTRUCTIONS SMOKING Cigarette smoking nearly doubles your risk of having a stroke & is the single most alterable risk factor  If you smoke or have smoked in the last 12 months, you are advised to quit smoking for your health.  Most of the excess cardiovascular risk related to smoking disappears within a year of stopping.  Ask you doctor about anti-smoking medications  [place] Quit Line: 1-800-QUIT NOW  Free Smoking Cessation Classes [phone]  CHOLESTEROL Know your levels; limit fat & cholesterol in your diet  Lipid Panel     Component Value Date/Time   CHOL 265* 07/08/2014 1956   TRIG 239* 07/08/2014 1956   HDL 38* 07/08/2014 1956   CHOLHDL 7.0 07/08/2014 1956   VLDL 48* 07/08/2014 1956   LDLCALC 179* 07/08/2014 1956      Many patients benefit from treatment even if their cholesterol is at goal.  Goal: Total Cholesterol (CHOL) less than 160  Goal:  Triglycerides (TRIG) less than 150  Goal:  HDL greater than 40  Goal:  LDL (LDLCALC) less than 100   BLOOD PRESSURE American Stroke Association blood pressure target is less that 120/80 mm/Hg  Your discharge blood pressure is:  BP: 110/60 mmHg  Monitor your blood pressure  Limit your salt and alcohol intake  Many individuals will require more than one medication for high blood pressure  DIABETES (A1c is a blood sugar average for last 3 months) Goal HGBA1c is under 7% (HBGA1c is blood sugar average for last 3 months)  Diabetes: Diagnosis of diabetes:  Your A1c:8.0 %    Lab Results  Component Value Date   HGBA1C 8.0* 07/08/2014     Your HGBA1c can be lowered with medications, healthy diet, and exercise.  Check your blood sugar as directed by your physician  Call your physician if you experience unexplained or low blood sugars.  PHYSICAL ACTIVITY/REHABILITATION Goal is 30 minutes at least 4 days per week  Activity: Increase Activity Slowly Therapies: Physical Therapy: Home Health Occupational  [employer] Return to work: N/A  Activity decreases your risk of heart attack and stroke and makes your heart stronger.  It helps control your weight and blood pressure; helps you relax and can improve your mood.  Participate in a regular exercise program.  Talk with your doctor about the best form of exercise for you (dancing, walking, swimming, cycling).  DIET/WEIGHT Goal is to maintain a healthy weight  Your discharge diet is: Carb control Your height is:  Height: 5\' 2"  (157.5 cm) Your current weight is: Weight: 88.6 kg (195 lb 5.2 oz) Your Body Mass Index (BMI) is:  BMI (Calculated): 35.6  Following the type of diet specifically designed for you will help prevent another stroke.  Your goal weight range is: 104-131 lbs  Your goal Body Mass Index (BMI) is 19-24.  Healthy food habits can help reduce 3 risk factors for stroke:  High cholesterol, hypertension, and excess weight.  RESOURCES Stroke/Support Group:  Call [phone]   STROKE EDUCATION PROVIDED/REVIEWED AND GIVEN TO PATIENT Stroke warning signs and symptoms How to activate emergency medical system (call 911). Medications prescribed at discharge. Need for follow-up after discharge. Personal risk factors for stroke. Pneumonia vaccine given: No Flu vaccine given: No My questions have been answered, the writing is legible, and I understand these instructions.  I will adhere to these goals & educational materials that have been provided to me after my discharge from the hospital.

## 2014-08-20 ENCOUNTER — Other Ambulatory Visit: Payer: Self-pay | Admitting: Internal Medicine

## 2014-08-20 DIAGNOSIS — Z1231 Encounter for screening mammogram for malignant neoplasm of breast: Secondary | ICD-10-CM

## 2014-09-16 ENCOUNTER — Telehealth: Payer: Self-pay | Admitting: *Deleted

## 2014-09-16 ENCOUNTER — Other Ambulatory Visit: Payer: Self-pay | Admitting: *Deleted

## 2014-09-16 DIAGNOSIS — N938 Other specified abnormal uterine and vaginal bleeding: Secondary | ICD-10-CM

## 2014-09-16 NOTE — Telephone Encounter (Signed)
Contacted patient, informed of referral and ultrasound appointment [facility].  Ultrasound appointment is 10/02/14 @ 0915, instructions given.  Pt verbalizes understanding.

## 2014-09-30 ENCOUNTER — Other Ambulatory Visit (HOSPITAL_COMMUNITY)
Admission: RE | Admit: 2014-09-30 | Discharge: 2014-09-30 | Disposition: A | Discharge status: Home / Self Care | Payer: Medicare Other | Source: Ambulatory Visit | Attending: Obstetrics & Gynecology | Admitting: Obstetrics & Gynecology

## 2014-09-30 ENCOUNTER — Encounter: Payer: Self-pay | Admitting: Obstetrics & Gynecology

## 2014-09-30 ENCOUNTER — Ambulatory Visit (INDEPENDENT_AMBULATORY_CARE_PROVIDER_SITE_OTHER): Payer: Medicare Other | Admitting: Obstetrics & Gynecology

## 2014-09-30 VITALS — BP 155/72 | HR 74 | Temp 98.2°F | Ht 61.0 in | Wt 193.1 lb

## 2014-09-30 DIAGNOSIS — Z1151 Encounter for screening for human papillomavirus (HPV): Secondary | ICD-10-CM | POA: Insufficient documentation

## 2014-09-30 DIAGNOSIS — Z124 Encounter for screening for malignant neoplasm of cervix: Secondary | ICD-10-CM

## 2014-09-30 DIAGNOSIS — Z113 Encounter for screening for infections with a predominantly sexual mode of transmission: Secondary | ICD-10-CM | POA: Diagnosis present

## 2014-09-30 DIAGNOSIS — N950 Postmenopausal bleeding: Secondary | ICD-10-CM | POA: Insufficient documentation

## 2014-09-30 NOTE — Patient Instructions (Signed)
Return to clinic for any scheduled appointments or for any gynecologic concerns as needed.   

## 2014-09-30 NOTE — Progress Notes (Signed)
   CLINIC ENCOUNTER NOTE  History:  68 y.o. [phone] here today for evaluation of postmenopausal bleeding for several months. Went through menopause in her 4450s.  Accompanied by her daughter who is her healthcare proxy and power of attorney. Patient is unable to give history or consent given her mental status; history and consents obtained from her daughter.  The following portions of the patient's history were reviewed and updated as appropriate: allergies, current medications, past family history, past medical history, past social history, past surgical history and problem list. Does not remember when she had last pap smear.  Normal mammogram in 2013, scheduled for repeat on 10/16/14.   Review of Systems:  Pertinent items are noted in HPI.  Objective:  Physical Exam BP 155/72 mmHg  Pulse 74  Temp(Src) 98.2 F (36.8 C) (Oral)  Ht 5\' 1"  (1.549 m)  Wt 193 lb 1.6 oz (87.59 kg)  BMI 36.50 kg/m2  LMP 09/28/2014 (Approximate) Gen: NAD Abd: Soft, nontender and nondistended Pelvic: Normal appearing external genitalia; moderate atrophy of vaginal mucosa and cervix.  Pap smear obtained  Scant bloody discharge.  Unable to palpate uterus or adnexa secondary to habitus.    ENDOMETRIAL BIOPSY     The indications for endometrial biopsy were reviewed.   Risks of the biopsy including cramping, bleeding, infection, uterine perforation, inadequate specimen and need for additional procedures  were discussed. The patient states she understands and agrees to undergo procedure today. Consent was signed. Time out was performed.  During the pelvic exam, the cervix was prepped with Betadine. A single-toothed tenaculum was placed on the anterior lip of the cervix to stabilize it. The 3 mm pipelle was introduced into the endometrial cavity without difficulty to a depth of 6 cm, unable to progress pipelle past this point, and a moderate amount of tissue was obtained after two passes and sent to pathology. The  instruments were removed from the patient's vagina. Minimal bleeding from the cervix was noted. The patient tolerated the procedure well. Routine post-procedure instructions were given to the patient.     Assessment & Plan:  Postmenopausal bleeding s/p pap and endometrial biopsy; will follow up results and manage accordingly. Pelvic ultrasound scheduled on 10/02/14; will follow up results and manage accordingly. Patient and her daughter aware of concern about neoplasia or other abnormal cells. Routine preventative health maintenance measures emphasized.    Jaynie CollinsUGONNA  Mayley Lish, MD, FACOG Attending Obstetrician & Gynecologist Center for [facility], [facility]

## 2014-10-01 LAB — CYTOLOGY - PAP

## 2014-10-02 ENCOUNTER — Ambulatory Visit (HOSPITAL_COMMUNITY)
Admission: RE | Admit: 2014-10-02 | Discharge: 2014-10-02 | Disposition: A | Discharge status: Home / Self Care | Payer: Medicare Other | Source: Ambulatory Visit | Attending: Obstetrics & Gynecology | Admitting: Obstetrics & Gynecology

## 2014-10-02 ENCOUNTER — Telehealth: Payer: Self-pay

## 2014-10-02 DIAGNOSIS — N854 Malposition of uterus: Secondary | ICD-10-CM | POA: Insufficient documentation

## 2014-10-02 DIAGNOSIS — N950 Postmenopausal bleeding: Secondary | ICD-10-CM | POA: Diagnosis not present

## 2014-10-02 DIAGNOSIS — N938 Other specified abnormal uterine and vaginal bleeding: Secondary | ICD-10-CM

## 2014-10-02 NOTE — Telephone Encounter (Signed)
Called patient and spoke to daughter-- informed her of results and recommendations. States patient is in U/S right now. Informed her she will be called with those results once reviewed. Verbalized understanding and gratitude. No questions or concerns.

## 2014-10-02 NOTE — Telephone Encounter (Signed)
-----   Message from Tereso NewcomerUgonna A Anyanwu, MD sent at 10/02/2014  8:35 AM EST ----- Benign endometrial biopsy  That showed endometrial polyp and negative pap smear with negative HRHPV. Will await report of pelvic ultrasound.   If bleeding continues to be concerning, may need hysteroscopy, D&C or further management. No acute intervention needed for now.   Please call to inform patient of results and recommendations; have to speak to patient's daughter as patient has some comprehension concerns due to her mental status.

## 2014-10-07 ENCOUNTER — Telehealth: Payer: Self-pay

## 2014-10-07 NOTE — Telephone Encounter (Signed)
-----   Message from Tereso NewcomerUgonna A Anyanwu, MD sent at 10/04/2014 12:21 PM EST ----- Normal pelvic ultrasound.  Please call to inform patient of results.

## 2014-10-07 NOTE — Telephone Encounter (Signed)
Called patient. Spoke to daughter Renford DillsBrenda Bernett. Informed her of normal results. Verbalized understanding. No questions or concerns.

## 2014-10-16 ENCOUNTER — Ambulatory Visit
Admission: RE | Admit: 2014-10-16 | Discharge: 2014-10-16 | Disposition: A | Discharge status: Home / Self Care | Payer: Medicare Other | Source: Ambulatory Visit | Attending: Internal Medicine | Admitting: Internal Medicine

## 2014-10-16 DIAGNOSIS — Z1231 Encounter for screening mammogram for malignant neoplasm of breast: Secondary | ICD-10-CM

## 2014-11-08 ENCOUNTER — Encounter: Payer: Self-pay | Admitting: *Deleted

## 2015-09-13 ENCOUNTER — Emergency Department (HOSPITAL_COMMUNITY): Payer: Medicare Other

## 2015-09-13 ENCOUNTER — Emergency Department (HOSPITAL_COMMUNITY)
Admission: EM | Admit: 2015-09-13 | Discharge: 2015-09-13 | Disposition: A | Discharge status: Home / Self Care | Payer: Medicare Other | Attending: Emergency Medicine | Admitting: Emergency Medicine

## 2015-09-13 ENCOUNTER — Encounter (HOSPITAL_COMMUNITY): Payer: Self-pay | Admitting: Nurse Practitioner

## 2015-09-13 DIAGNOSIS — Z7902 Long term (current) use of antithrombotics/antiplatelets: Secondary | ICD-10-CM | POA: Insufficient documentation

## 2015-09-13 DIAGNOSIS — M79604 Pain in right leg: Secondary | ICD-10-CM | POA: Diagnosis not present

## 2015-09-13 DIAGNOSIS — Z880 Allergy status to penicillin: Secondary | ICD-10-CM | POA: Insufficient documentation

## 2015-09-13 DIAGNOSIS — R531 Weakness: Secondary | ICD-10-CM | POA: Insufficient documentation

## 2015-09-13 DIAGNOSIS — Z720 Tobacco use: Secondary | ICD-10-CM | POA: Diagnosis not present

## 2015-09-13 DIAGNOSIS — R51 Headache: Secondary | ICD-10-CM | POA: Insufficient documentation

## 2015-09-13 DIAGNOSIS — I10 Essential (primary) hypertension: Secondary | ICD-10-CM | POA: Diagnosis not present

## 2015-09-13 DIAGNOSIS — Z8673 Personal history of transient ischemic attack (TIA), and cerebral infarction without residual deficits: Secondary | ICD-10-CM | POA: Insufficient documentation

## 2015-09-13 DIAGNOSIS — E119 Type 2 diabetes mellitus without complications: Secondary | ICD-10-CM | POA: Diagnosis not present

## 2015-09-13 DIAGNOSIS — Z7982 Long term (current) use of aspirin: Secondary | ICD-10-CM | POA: Insufficient documentation

## 2015-09-13 DIAGNOSIS — Z72821 Inadequate sleep hygiene: Secondary | ICD-10-CM | POA: Diagnosis not present

## 2015-09-13 LAB — COMPREHENSIVE METABOLIC PANEL
ALBUMIN: 3.5 g/dL (ref 3.5–5.0)
ALK PHOS: 70 U/L (ref 38–126)
ALT: 18 U/L (ref 14–54)
AST: 21 U/L (ref 15–41)
Anion gap: 7 (ref 5–15)
BILIRUBIN TOTAL: 0.6 mg/dL (ref 0.3–1.2)
BUN: 14 mg/dL (ref 6–20)
CALCIUM: 9 mg/dL (ref 8.9–10.3)
CO2: 28 mmol/L (ref 22–32)
CREATININE: 1.09 mg/dL — AB (ref 0.44–1.00)
Chloride: 102 mmol/L (ref 101–111)
GFR calc Af Amer: 59 mL/min — ABNORMAL LOW (ref >60)
GFR calc non Af Amer: 51 mL/min — ABNORMAL LOW (ref >60)
GLUCOSE: 123 mg/dL — AB (ref 65–99)
Potassium: 4 mmol/L (ref 3.5–5.1)
Sodium: 137 mmol/L (ref 135–145)
TOTAL PROTEIN: 6.4 g/dL — AB (ref 6.5–8.1)

## 2015-09-13 LAB — APTT: aPTT: 26 seconds (ref 24–37)

## 2015-09-13 LAB — CBC
HCT: 43.2 % (ref 36.0–46.0)
HEMOGLOBIN: 13.8 g/dL (ref 12.0–15.0)
MCH: 27.8 pg (ref 26.0–34.0)
MCHC: 31.9 g/dL (ref 30.0–36.0)
MCV: 86.9 fL (ref 78.0–100.0)
Platelets: 249 10*3/uL (ref 150–400)
RBC: 4.97 MIL/uL (ref 3.87–5.11)
RDW: 14.8 % (ref 11.5–15.5)
WBC: 8.3 10*3/uL (ref 4.0–10.5)

## 2015-09-13 LAB — URINALYSIS, ROUTINE W REFLEX MICROSCOPIC
BILIRUBIN URINE: NEGATIVE
Glucose, UA: NEGATIVE mg/dL
Hgb urine dipstick: NEGATIVE
KETONES UR: NEGATIVE mg/dL
LEUKOCYTES UA: NEGATIVE
NITRITE: NEGATIVE
Protein, ur: NEGATIVE mg/dL
Specific Gravity, Urine: 1.014 (ref 1.005–1.030)
UROBILINOGEN UA: 1 mg/dL (ref 0.0–1.0)
pH: 5.5 (ref 5.0–8.0)

## 2015-09-13 LAB — I-STAT TROPONIN, ED: Troponin i, poc: 0.01 ng/mL (ref 0.00–0.08)

## 2015-09-13 LAB — DIFFERENTIAL
BASOS ABS: 0 10*3/uL (ref 0.0–0.1)
Basophils Relative: 0 %
EOS PCT: 2 %
Eosinophils Absolute: 0.2 10*3/uL (ref 0.0–0.7)
LYMPHS ABS: 3.2 10*3/uL (ref 0.7–4.0)
LYMPHS PCT: 38 %
Monocytes Absolute: 0.5 10*3/uL (ref 0.1–1.0)
Monocytes Relative: 6 %
NEUTROS ABS: 4.4 10*3/uL (ref 1.7–7.7)
NEUTROS PCT: 54 %

## 2015-09-13 LAB — I-STAT CHEM 8, ED
BUN: 17 mg/dL (ref 6–20)
CREATININE: 1.1 mg/dL — AB (ref 0.44–1.00)
Calcium, Ion: 1.16 mmol/L (ref 1.13–1.30)
Chloride: 104 mmol/L (ref 101–111)
Glucose, Bld: 120 mg/dL — ABNORMAL HIGH (ref 65–99)
HEMATOCRIT: 45 % (ref 36.0–46.0)
HEMOGLOBIN: 15.3 g/dL — AB (ref 12.0–15.0)
POTASSIUM: 4.1 mmol/L (ref 3.5–5.1)
SODIUM: 142 mmol/L (ref 135–145)
TCO2: 27 mmol/L (ref 0–100)

## 2015-09-13 LAB — PROTIME-INR
INR: 1.02 (ref 0.00–1.49)
PROTHROMBIN TIME: 13.6 s (ref 11.6–15.2)

## 2015-09-13 LAB — CBG MONITORING, ED: Glucose-Capillary: 78 mg/dL (ref 65–99)

## 2015-09-13 MED ORDER — ACETAMINOPHEN 500 MG PO TABS
1000.0000 mg | ORAL_TABLET | Freq: Once | ORAL | Status: AC
  Administered 2015-09-13: 1000 mg via ORAL
  Filled 2015-09-13: qty 2

## 2015-09-13 NOTE — ED Provider Notes (Signed)
CSN: [id number]     Arrival date & time 09/13/15  1219 History   First MD Initiated Contact with Patient 09/13/15 1233     Chief Complaint  Patient presents with  . Weakness    An emergency department physician performed an initial assessment on this suspected stroke patient at 1250. (Consider location/radiation/quality/duration/timing/severity/associated sxs/prior Treatment) Patient is a 69 y.o. female presenting with weakness. The history is provided by a relative and the patient.  Weakness This is a new problem. The current episode started 3 to 5 hours ago. The problem occurs constantly. The problem has not changed since onset.Pertinent negatives include no chest pain and no abdominal pain. Nothing aggravates the symptoms. Nothing relieves the symptoms. She has tried nothing for the symptoms.    Past Medical History  Diagnosis Date  . Stroke (HCC)   . Diabetes mellitus   . Hypertension   . H/O: CVA (cerebrovascular accident) 07/08/2014   Past Surgical History  Procedure Laterality Date  . No past surgeries     Family History  Problem Relation Age of Onset  . Hyperlipidemia Mother   . Hypertension Mother    Social History  Substance Use Topics  . Smoking status: Current Every Day Smoker -- 1.00 packs/day for 48 years    Types: Cigarettes  . Smokeless tobacco: Never Used  . Alcohol Use: No   OB History    Gravida Para Term Preterm AB TAB SAB Ectopic Multiple Living   0 0 0 0 0 0 3     Review of Systems  Cardiovascular: Negative for chest pain.  Gastrointestinal: Negative for abdominal pain.  Neurological: Positive for weakness.  All other systems reviewed and are negative.     Allergies  Penicillins  Home Medications   Prior to Admission medications   Medication Sig Start Date End Date Taking? Authorizing Provider  acetaminophen (TYLENOL) 325 MG tablet Take 650 mg by mouth every 6 (six) hours as needed for pain.    Historical Provider, MD  aspirin 81  MG tablet Take 81 mg by mouth daily.     Historical Provider, MD  atenolol (TENORMIN) 25 MG tablet Take 1 tablet (25 mg total) by mouth daily. 08/16/13   Linwood Dibbles, MD  atorvastatin (LIPITOR) 10 MG tablet Take 1 tablet (10 mg total) by mouth daily. 07/11/14   Jerald Kief, MD  clopidogrel (PLAVIX) 75 MG tablet Take 1 tablet (75 mg total) by mouth daily. 07/11/14   Jerald Kief, MD  lisinopril (PRINIVIL,ZESTRIL) 5 MG tablet Take 1 tablet (5 mg total) by mouth daily. 07/11/14   Jerald Kief, MD  metFORMIN (GLUCOPHAGE) 500 MG tablet Take 1 tablet (500 mg total) by mouth 2 (two) times daily with a meal. 07/11/14   Jerald Kief, MD  omeprazole (PRILOSEC) 20 MG capsule Take 1 capsule (20 mg total) by mouth daily. 08/16/13   Linwood Dibbles, MD   BP 105/51 mmHg  Pulse 57  Temp(Src) 97.9 F (36.6 C) (Oral)  Resp 15  SpO2 99%  LMP 09/28/2014 (Approximate) Physical Exam  Constitutional: She is oriented to person, place, and time. She appears well-developed and well-nourished. No distress.  HENT:  Head: Normocephalic and atraumatic.  Eyes: Conjunctivae are normal.  Neck: Neck supple. No tracheal deviation present.  Cardiovascular: Normal rate, regular rhythm and normal heart sounds.   Pulmonary/Chest: Effort normal and breath sounds normal. No respiratory distress.  Abdominal: Soft. She exhibits no distension. There is no tenderness.  Musculoskeletal:  Normal range of motion.  Neurological: She is alert and oriented to person, place, and time. GCS eye subscore is 4. GCS verbal subscore is 5. GCS motor subscore is 6.  Persistent extremity weakness and mild left facial asymmetry from prior CVA.  Skin: Skin is warm and dry.  Psychiatric: She has a normal mood and affect.    ED Course  Procedures (including critical care time) Labs Review Labs Reviewed  COMPREHENSIVE METABOLIC PANEL - Abnormal; Notable for the following:    Glucose, Bld 123 (*)    Creatinine, Ser 1.09 (*)    Total Protein 6.4 (*)     GFR calc non Af Amer 51 (*)    GFR calc Af Amer 59 (*)    All other components within normal limits  I-STAT CHEM 8, ED - Abnormal; Notable for the following:    Creatinine, Ser 1.10 (*)    Glucose, Bld 120 (*)    Hemoglobin 15.3 (*)    All other components within normal limits  PROTIME-INR  APTT  CBC  DIFFERENTIAL  URINALYSIS, ROUTINE W REFLEX MICROSCOPIC (NOT AT [facility])  I-STAT TROPOININ, ED  CBG MONITORING, ED    Imaging Review Ct Head Wo Contrast  09/13/2015  CLINICAL DATA:  RIGHT side weakness, doesn't feel well since awakening this morning, history stroke, hypertension, diabetes mellitus, smoking EXAM: CT HEAD WITHOUT CONTRAST TECHNIQUE: Contiguous axial images were obtained from the base of the skull through the vertex without intravenous contrast. COMPARISON:  07/08/2014 FINDINGS: Images were repeated due to patient motion/coughing. Generalized atrophy. Ex vacuo dilatation of the frontal horn of the RIGHT lateral ventricle secondary to large old LEFT frontal and LEFT basal ganglia infarcts. No midline shift or mass effect. Small vessel chronic ischemic changes of deep cerebral white matter. No intracranial hemorrhage, mass lesion, or evidence of acute infarction. No extra-axial fluid collections. Posterior fossa unremarkable. Paranasal sinuses and mastoid air cells clear. Atherosclerotic calcifications at the carotid siphons. Calvaria intact. IMPRESSION: Atrophy with small vessel chronic ischemic changes of deep cerebral white matter. 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 No acute intracranial abnormalities. Electronically Signed   By: Ulyses Southward M.D.   On: 09/13/2015 13:21   Dg Chest Port 1 View  09/13/2015  CLINICAL DATA:  Weakness for 1 day, RIGHT leg pain last evening, did not sleep well, hypertension, diabetes mellitus, stroke EXAM: PORTABLE CHEST 1 VIEW COMPARISON:  Portable exam 1325 hours compared to 07/08/2014 FINDINGS: Normal heart size, mediastinal  contours and pulmonary vascularity. Lungs clear. No pleural effusion or pneumothorax. Bones unremarkable. IMPRESSION: No acute abnormalities. Electronically Signed   By: Ulyses Southward M.D.   On: 09/13/2015 13:46   I have personally reviewed and evaluated these images and lab results as part of my medical decision-making.   EKG Interpretation   Date/Time:  Saturday September 13 2015 12:41:53 EDT Ventricular Rate:  62 PR Interval:  158 QRS Duration: 90 QT Interval:  415 QTC Calculation: 421 R Axis:   72 Text Interpretation:  Sinus rhythm Low voltage, precordial leads  Borderline repolarization abnormality Baseline wander in lead(s) II III  aVR aVL aVF V3 V4 V5 V6 No significant change since last tracing Confirmed  by Helton Oleson MD, Reuel Boom ([id number]) on 09/13/2015 3:03:41 PM      MDM   Final diagnoses:  Feeling weak  Poor sleep hygiene  Right leg pain    69 y.o. female presents with feeling weak after losing sleep last night d/t pain in her right leg.  Not significantly tender here. Pt's daughter concerned because this was prodrome to previous stroke. No acute findings on head CT. No infectious, metabolic, or neurologic cause identified on an extensive workup today. Do not feel further inpatient workup is warranted as Pt is at her baseline per daughter at bedside. Plan to follow up with PCP as needed and return precautions discussed for worsening or new concerning symptoms.     Lyndal Pulleyaniel Tiffney Haughton, MD 09/13/15 [phone]

## 2015-09-13 NOTE — ED Notes (Signed)
Patient transported to CT 

## 2015-09-13 NOTE — ED Notes (Signed)
Dr. Clydene PughKnott, MD at bedside.

## 2015-09-13 NOTE — ED Notes (Signed)
X-ray at bedside

## 2015-09-13 NOTE — Discharge Instructions (Signed)
Fatigue  Fatigue is feeling tired all of the time, a lack of energy, or a lack of motivation. Occasional or mild fatigue is often a normal response to activity or life in general. However, long-lasting (chronic) or extreme fatigue may indicate an underlying medical condition.  HOME CARE INSTRUCTIONS   Watch your fatigue for any changes. The following actions may help to lessen any discomfort you are feeling:  · Talk to your health care provider about how much sleep you need each night. Try to get the required amount every night.  · Take medicines only as directed by your health care provider.  · Eat a healthy and nutritious diet. Ask your health care provider if you need help changing your diet.  · Drink enough fluid to keep your urine clear or pale yellow.  · Practice ways of relaxing, such as yoga, meditation, massage therapy, or acupuncture.  · Exercise regularly.    · Change situations that cause you stress. Try to keep your work and personal routine reasonable.  · Do not abuse illegal drugs.  · Limit alcohol intake to no more than 1 drink per day for nonpregnant women and 2 drinks per day for men. One drink equals 12 ounces of beer, 5 ounces of wine, or 1½ ounces of hard liquor.  · Take a multivitamin, if directed by your health care provider.  SEEK MEDICAL CARE IF:   · Your fatigue does not get better.  · You have a fever.    · You have unintentional weight loss or gain.  · You have headaches.    · You have difficulty:      Falling asleep.    Sleeping throughout the night.  · You feel angry, guilty, anxious, or sad.     · You are unable to have a bowel movement (constipation).    · You skin is dry.     · Your legs or another part of your body is swollen.    SEEK IMMEDIATE MEDICAL CARE IF:   · You feel confused.    · Your vision is blurry.  · You feel faint or pass out.    · You have a severe headache.    · You have severe abdominal, pelvic, or back pain.    · You have chest pain, shortness of breath, or an  irregular or fast heartbeat.    · You are unable to urinate or you urinate less than normal.    · You develop abnormal bleeding, such as bleeding from the rectum, vagina, nose, lungs, or nipples.  · You vomit blood.     · You have thoughts about harming yourself or committing suicide.    · You are worried that you might harm someone else.       This information is not intended to replace advice given to you by your health care provider. Make sure you discuss any questions you have with your health care provider.     Document Released: 09/05/2007 Document Revised: 11/29/2014 Document Reviewed: 03/12/2014  Elsevier Interactive Patient Education ©2016 [employer]

## 2015-09-13 NOTE — ED Notes (Addendum)
Daughter states when she went to get her mother up this am she was unable to get out of bed with cane like normal. Pt was c/o  R leg pain last night, states she didn't sleep well all night. Daughter is concerned because the pt has had stroke in the past and seems weaker today but continues to c/o leg pain. Daughter states pt has dementia but is more confused today. Pt with generalized weakness, R arm drift, R sided facial droop. Daughter states pt normally has R facial droop from her old stroke. Pt is alert, breathing easily. Pt was last normal last night before bed.

## 2015-11-25 ENCOUNTER — Other Ambulatory Visit: Payer: Self-pay | Admitting: Internal Medicine

## 2015-11-25 DIAGNOSIS — E2839 Other primary ovarian failure: Secondary | ICD-10-CM

## 2015-12-25 ENCOUNTER — Ambulatory Visit
Admission: RE | Admit: 2015-12-25 | Discharge: 2015-12-25 | Disposition: A | Discharge status: Home / Self Care | Payer: Medicare Other | Source: Ambulatory Visit | Attending: Internal Medicine | Admitting: Internal Medicine

## 2015-12-25 DIAGNOSIS — E2839 Other primary ovarian failure: Secondary | ICD-10-CM

## 2016-03-18 ENCOUNTER — Ambulatory Visit: Payer: Medicare Other | Admitting: Podiatry

## 2016-03-23 ENCOUNTER — Encounter: Payer: Self-pay | Admitting: Podiatry

## 2016-03-23 ENCOUNTER — Ambulatory Visit (INDEPENDENT_AMBULATORY_CARE_PROVIDER_SITE_OTHER): Payer: Medicare Other | Admitting: Podiatry

## 2016-03-23 ENCOUNTER — Ambulatory Visit (INDEPENDENT_AMBULATORY_CARE_PROVIDER_SITE_OTHER): Payer: Medicare Other

## 2016-03-23 VITALS — BP 142/88 | HR 66 | Resp 12

## 2016-03-23 DIAGNOSIS — I739 Peripheral vascular disease, unspecified: Secondary | ICD-10-CM | POA: Diagnosis not present

## 2016-03-23 DIAGNOSIS — E119 Type 2 diabetes mellitus without complications: Secondary | ICD-10-CM

## 2016-03-23 DIAGNOSIS — E1142 Type 2 diabetes mellitus with diabetic polyneuropathy: Secondary | ICD-10-CM | POA: Diagnosis not present

## 2016-03-23 DIAGNOSIS — E109 Type 1 diabetes mellitus without complications: Secondary | ICD-10-CM | POA: Diagnosis not present

## 2016-03-23 NOTE — Progress Notes (Signed)
   Subjective:    Patient ID: [name], female    DOB: [birth year], 70 y.o.   MRN: [id number]  HPI: She presents today with a chief complaint of toes to her left foot that appear to be more purple and she has pain to her left lower extremity. She states that she has some numbness and tingling to the bilateral toes but the majority of the pains in the left foot. She has a history of diabetes that is well kept and she's also had multiple strokes leaving her left side weak. She is primarily concerned about the left foot at this point. She has a history of left-sided back pain. She is also a smoker.    Review of Systems  Musculoskeletal: Positive for myalgias.  Hematological: Bruises/bleeds easily.       Objective:   Physical Exam: Vital signs are stable she is alert and oriented 3 pulses are palpable. Capillary fill time is sluggish to the left lower extremity with a slight cyanotic you to the toes. The left foot is cooler to the touch. Neurologic sensorium is diminished for Semmes-Weinstein monofilament bilateral. Vibratory sensation is intact bilateral foot. Hyperreflexive tendon Achilles left.. Deep tendon reflexes are intact. Muscle strength +5 over 5 dorsiflexion plantar flexors and inverters everters on his musculature is intact right foot greater than the left. Orthopedic evaluation demonstrates all joints distal to the ankle full range of motion without crepitation. Cutaneous evaluation demonstrates no open lesions or wounds. Radiographs do not demonstrate any major osseous abnormalities or fractures.        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 I think part of her neuropathy is associated with back pain and from onto her left side. I think part of it is associated with her stroke as well.  Plan: At this point I will send her to vascular for evaluation follow-up with her after that to see if we can treat the pain and  other symptoms.

## 2016-03-24 ENCOUNTER — Telehealth: Payer: Self-pay | Admitting: *Deleted

## 2016-03-24 DIAGNOSIS — R0989 Other specified symptoms and signs involving the circulatory and respiratory systems: Secondary | ICD-10-CM

## 2016-03-24 NOTE — Telephone Encounter (Signed)
[facility] faxed.

## 2016-03-25 ENCOUNTER — Other Ambulatory Visit: Payer: Self-pay | Admitting: Podiatry

## 2016-03-25 DIAGNOSIS — R0989 Other specified symptoms and signs involving the circulatory and respiratory systems: Secondary | ICD-10-CM

## 2016-03-30 ENCOUNTER — Ambulatory Visit (HOSPITAL_COMMUNITY)
Admission: RE | Admit: 2016-03-30 | Discharge: 2016-03-30 | Disposition: A | Discharge status: Home / Self Care | Payer: Medicare Other | Source: Ambulatory Visit | Attending: Cardiovascular Disease | Admitting: Cardiovascular Disease

## 2016-03-30 DIAGNOSIS — E119 Type 2 diabetes mellitus without complications: Secondary | ICD-10-CM | POA: Diagnosis not present

## 2016-03-30 DIAGNOSIS — M549 Dorsalgia, unspecified: Secondary | ICD-10-CM | POA: Insufficient documentation

## 2016-03-30 DIAGNOSIS — R0989 Other specified symptoms and signs involving the circulatory and respiratory systems: Secondary | ICD-10-CM | POA: Diagnosis not present

## 2016-03-30 DIAGNOSIS — Z8673 Personal history of transient ischemic attack (TIA), and cerebral infarction without residual deficits: Secondary | ICD-10-CM | POA: Insufficient documentation

## 2016-03-30 DIAGNOSIS — I10 Essential (primary) hypertension: Secondary | ICD-10-CM | POA: Diagnosis not present

## 2016-03-30 DIAGNOSIS — I70203 Unspecified atherosclerosis of native arteries of extremities, bilateral legs: Secondary | ICD-10-CM | POA: Diagnosis not present

## 2016-03-30 DIAGNOSIS — F17200 Nicotine dependence, unspecified, uncomplicated: Secondary | ICD-10-CM | POA: Insufficient documentation

## 2016-07-12 ENCOUNTER — Encounter: Payer: Self-pay | Admitting: Cardiology

## 2018-03-10 ENCOUNTER — Emergency Department (HOSPITAL_COMMUNITY): Payer: Medicare Other

## 2018-03-10 ENCOUNTER — Encounter (HOSPITAL_COMMUNITY): Payer: Self-pay

## 2018-03-10 ENCOUNTER — Emergency Department (HOSPITAL_COMMUNITY)
Admission: EM | Admit: 2018-03-10 | Discharge: 2018-03-10 | Disposition: A | Discharge status: Home / Self Care | Payer: Medicare Other | Attending: Emergency Medicine | Admitting: Emergency Medicine

## 2018-03-10 DIAGNOSIS — M25551 Pain in right hip: Secondary | ICD-10-CM | POA: Insufficient documentation

## 2018-03-10 DIAGNOSIS — E119 Type 2 diabetes mellitus without complications: Secondary | ICD-10-CM | POA: Diagnosis not present

## 2018-03-10 DIAGNOSIS — Z7982 Long term (current) use of aspirin: Secondary | ICD-10-CM | POA: Insufficient documentation

## 2018-03-10 DIAGNOSIS — M542 Cervicalgia: Secondary | ICD-10-CM

## 2018-03-10 DIAGNOSIS — Z7902 Long term (current) use of antithrombotics/antiplatelets: Secondary | ICD-10-CM | POA: Diagnosis not present

## 2018-03-10 DIAGNOSIS — R0789 Other chest pain: Secondary | ICD-10-CM | POA: Diagnosis not present

## 2018-03-10 DIAGNOSIS — I10 Essential (primary) hypertension: Secondary | ICD-10-CM | POA: Insufficient documentation

## 2018-03-10 DIAGNOSIS — Z7901 Long term (current) use of anticoagulants: Secondary | ICD-10-CM | POA: Insufficient documentation

## 2018-03-10 DIAGNOSIS — Z8673 Personal history of transient ischemic attack (TIA), and cerebral infarction without residual deficits: Secondary | ICD-10-CM | POA: Insufficient documentation

## 2018-03-10 DIAGNOSIS — F17210 Nicotine dependence, cigarettes, uncomplicated: Secondary | ICD-10-CM | POA: Insufficient documentation

## 2018-03-10 DIAGNOSIS — F0390 Unspecified dementia without behavioral disturbance: Secondary | ICD-10-CM | POA: Insufficient documentation

## 2018-03-10 DIAGNOSIS — Z79899 Other long term (current) drug therapy: Secondary | ICD-10-CM | POA: Diagnosis not present

## 2018-03-10 DIAGNOSIS — Z7984 Long term (current) use of oral hypoglycemic drugs: Secondary | ICD-10-CM | POA: Diagnosis not present

## 2018-03-10 DIAGNOSIS — R079 Chest pain, unspecified: Secondary | ICD-10-CM | POA: Diagnosis present

## 2018-03-10 LAB — PROTIME-INR
INR: 1.02
Prothrombin Time: 13.3 seconds (ref 11.4–15.2)

## 2018-03-10 MED ORDER — ACETAMINOPHEN 500 MG PO TABS
1000.0000 mg | ORAL_TABLET | Freq: Once | ORAL | Status: AC
  Administered 2018-03-10: 1000 mg via ORAL
  Filled 2018-03-10: qty 2

## 2018-03-10 NOTE — ED Notes (Signed)
Patient transported to CT 
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
## 2018-03-10 NOTE — ED Notes (Signed)
Pt not in room.
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
## 2018-03-10 NOTE — ED Notes (Signed)
Delay in lab draw,  Pt not in room 

## 2020-01-09 ENCOUNTER — Other Ambulatory Visit: Payer: Self-pay

## 2020-01-09 ENCOUNTER — Other Ambulatory Visit: Payer: Self-pay | Admitting: Pharmacist

## 2020-01-09 ENCOUNTER — Ambulatory Visit: Payer: Medicare Other | Attending: Family Medicine | Admitting: Family Medicine

## 2020-01-09 ENCOUNTER — Encounter: Payer: Self-pay | Admitting: Family Medicine

## 2020-01-09 VITALS — BP 140/72 | HR 87 | Ht 63.0 in | Wt 185.0 lb

## 2020-01-09 DIAGNOSIS — Z79899 Other long term (current) drug therapy: Secondary | ICD-10-CM | POA: Diagnosis not present

## 2020-01-09 DIAGNOSIS — Z7902 Long term (current) use of antithrombotics/antiplatelets: Secondary | ICD-10-CM | POA: Diagnosis not present

## 2020-01-09 DIAGNOSIS — Z880 Allergy status to penicillin: Secondary | ICD-10-CM | POA: Insufficient documentation

## 2020-01-09 DIAGNOSIS — I10 Essential (primary) hypertension: Secondary | ICD-10-CM | POA: Insufficient documentation

## 2020-01-09 DIAGNOSIS — Z8673 Personal history of transient ischemic attack (TIA), and cerebral infarction without residual deficits: Secondary | ICD-10-CM | POA: Diagnosis not present

## 2020-01-09 DIAGNOSIS — Z7984 Long term (current) use of oral hypoglycemic drugs: Secondary | ICD-10-CM | POA: Diagnosis not present

## 2020-01-09 DIAGNOSIS — Z7982 Long term (current) use of aspirin: Secondary | ICD-10-CM | POA: Insufficient documentation

## 2020-01-09 DIAGNOSIS — E1169 Type 2 diabetes mellitus with other specified complication: Secondary | ICD-10-CM | POA: Diagnosis present

## 2020-01-09 DIAGNOSIS — Z8249 Family history of ischemic heart disease and other diseases of the circulatory system: Secondary | ICD-10-CM | POA: Diagnosis not present

## 2020-01-09 LAB — GLUCOSE, POCT (MANUAL RESULT ENTRY): POC Glucose: 126 mg/dl — AB (ref 70–99)

## 2020-01-09 LAB — POCT GLYCOSYLATED HEMOGLOBIN (HGB A1C): HbA1c, POC (controlled diabetic range): 7.6 % — AB (ref 0.0–7.0)

## 2020-01-09 MED ORDER — METFORMIN HCL 500 MG PO TABS: 500.0000 mg | ORAL_TABLET | Freq: Two times a day (BID) | ORAL | 6 refills | Status: DC

## 2020-01-09 MED ORDER — TRUE METRIX BLOOD GLUCOSE TEST VI STRP: ORAL_STRIP | 6 refills | Status: AC

## 2020-01-09 MED ORDER — LISINOPRIL 5 MG PO TABS: 5.0000 mg | ORAL_TABLET | Freq: Every day | ORAL | 6 refills | Status: DC

## 2020-01-09 MED ORDER — ATENOLOL 25 MG PO TABS: 25.0000 mg | ORAL_TABLET | Freq: Every day | ORAL | 6 refills | Status: DC

## 2020-01-09 MED ORDER — ACCU-CHEK AVIVA DEVI: 0 refills | Status: AC

## 2020-01-09 MED ORDER — ACCU-CHEK MULTICLIX LANCETS MISC: 12 refills | Status: AC

## 2020-01-09 MED ORDER — ATORVASTATIN CALCIUM 10 MG PO TABS: 10.0000 mg | ORAL_TABLET | Freq: Every day | ORAL | 6 refills | Status: DC

## 2020-01-09 MED ORDER — CLOPIDOGREL BISULFATE 75 MG PO TABS: 75.0000 mg | ORAL_TABLET | Freq: Every day | ORAL | 6 refills | Status: DC

## 2020-01-09 MED FILL — TRUEplus LANCETS 28G MISC: 30 days supply | Qty: 100 | Fill #0

## 2020-01-09 MED FILL — !TRUE METRIX BLOOD GLUCOSE: 30 days supply | Qty: 1 | Fill #0

## 2020-01-09 MED FILL — metFORMIN HCL 500 MG TABS: 500 | 30 days supply | Qty: 60 | Fill #0

## 2020-01-09 MED FILL — LISINOPRIL 5 MG TABLET: 5 | 30 days supply | Qty: 30 | Fill #0

## 2020-01-09 MED FILL — CLOPIDOGREL 75 MG TABLET: 75 | 30 days supply | Qty: 30 | Fill #0

## 2020-01-09 MED FILL — ATENOLOL 25 MG TABLET: 25 | 30 days supply | Qty: 30 | Fill #0

## 2020-01-09 MED FILL — TRUE METRIX GLUCOSE TEST ST: 30 days supply | Qty: 100 | Fill #0

## 2020-01-09 MED FILL — ATORVASTATIN 10 MG TABLET: 10 | 30 days supply | Qty: 30 | Fill #0

## 2020-01-09 NOTE — Patient Instructions (Signed)
Type 2 Diabetes Mellitus, Self Care, Adult When you have type 2 diabetes (type 2 diabetes mellitus), you must make sure your blood sugar (glucose) stays in a healthy range. You can do this with:  Nutrition.  Exercise.  Lifestyle changes.  Medicines or insulin, if needed.  Support from your doctors and others. How to stay aware of blood sugar   Check your blood sugar level every day, as often as told.  Have your A1c (hemoglobin A1c) level checked two or more times a year. Have it checked more often if your doctor tells you to. Your doctor will set personal treatment goals for you. Generally, you should have these blood sugar levels:  Before meals (preprandial): 80-130 mg/dL (4.4-7.2 mmol/L).  After meals (postprandial): below 180 mg/dL (10 mmol/L).  A1c level: less than 7%. How to manage high and low blood sugar Signs of high blood sugar High blood sugar is called hyperglycemia. Know the signs of high blood sugar. Signs may include:  Feeling: ? Thirsty. ? Hungry. ? Very tired.  Needing to pee (urinate) more than usual.  Blurry vision. Signs of low blood sugar Low blood sugar is called hypoglycemia. This is when blood sugar is at or below 70 mg/dL (3.9 mmol/L). Signs may include:  Feeling: ? Hungry. ? Worried or nervous (anxious). ? Sweaty and clammy. ? Confused. ? Dizzy. ? Sleepy. ? Sick to your stomach (nauseous).  Having: ? A fast heartbeat. ? A headache. ? A change in your vision. ? Jerky movements that you cannot control (seizure). ? Tingling or no feeling (numbness) around your mouth, lips, or tongue.  Having trouble with: ? Moving (coordination). ? Sleeping. ? Passing out (fainting). ? Getting upset easily (irritability). Treating low blood sugar To treat low blood sugar, eat or drink something sugary right away. If you can think clearly and swallow safely, follow the 15:15 rule:  Take 15 grams of a fast-acting carb (carbohydrate). Talk with your  doctor about how much you should take.  Some fast-acting carbs are: ? Sugar tablets (glucose pills). Take 3-4 pills. ? 6-8 pieces of hard candy. ? 4-6 oz (120-150 mL) of fruit juice. ? 4-6 oz (120-150 mL) of regular (not diet) soda. ? 1 Tbsp (15 mL) honey or sugar.  Check your blood sugar 15 minutes after you take the carb.  If your blood sugar is still at or below 70 mg/dL (3.9 mmol/L), take 15 grams of a carb again.  If your blood sugar does not go above 70 mg/dL (3.9 mmol/L) after 3 tries, get help right away.  After your blood sugar goes back to normal, eat a meal or a snack within 1 hour. Treating very low blood sugar If your blood sugar is at or below 54 mg/dL (3 mmol/L), you have very low blood sugar (severe hypoglycemia). This is an emergency. Do not wait to see if the symptoms will go away. Get medical help right away. Call your local emergency services (911 in the [place] If you have very low blood sugar and you cannot eat or drink, you may need a glucagon shot (injection). A family member or friend should learn how to check your blood sugar and how to give you a glucagon shot. Ask your doctor if you need to have a glucagon shot kit at home. Follow these instructions at home: Medicine  Take insulin and diabetes medicines as told.  If your doctor says you should take more or less insulin and medicines, do this exactly as told.  Do not run out of insulin or medicines. Having diabetes can raise your risk for other long-term conditions. These include heart disease and kidney disease. Your doctor may prescribe medicines to help you not have these problems. Food   Make healthy food choices. These include: ? Chicken, fish, egg whites, and beans. ? Oats, whole wheat, bulgur, brown rice, quinoa, and millet. ? Fresh fruits and vegetables. ? Low-fat dairy products. ? Nuts, avocado, olive oil, and canola oil.  Meet with a food specialist (dietitian). He or she can help you make an  eating plan that is right for you.  Follow instructions from your doctor about what you cannot eat or drink.  Drink enough fluid to keep your pee (urine) pale yellow.  Keep track of carbs that you eat. Do this by reading food labels and learning food serving sizes.  Follow your sick day plan when you cannot eat or drink normally. Make this plan with your doctor so it is ready to use. Activity  Exercise 3 or more times a week.  Do not go more than 2 days without exercising.  Talk with your doctor before you start a new exercise. Your doctor may need to tell you to change: ? How much insulin or medicines you take. ? How much food you eat. Lifestyle  Do not use any tobacco products. These include cigarettes, chewing tobacco, and e-cigarettes. If you need help quitting, ask your doctor.  Ask your doctor how much alcohol is safe for you.  Learn to deal with stress. If you need help with this, ask your doctor. Body care   Stay up to date with your shots (immunizations).  Have your eyes and feet checked by a doctor as often as told.  Check your skin and feet every day. Check for cuts, bruises, redness, blisters, or sores.  Brush your teeth and gums two times a day. Floss one or more times a day.  Go to the dentist one or more times every 6 months.  Stay at a healthy weight. General instructions  Take over-the-counter and prescription medicines only as told by your doctor.  Share your diabetes care plan with: ? Your work or school. ? People you live with.  Carry a card or wear jewelry that says you have diabetes.  Keep all follow-up visits as told by your doctor. This is important. Questions to ask your doctor  Do I need to meet with a diabetes educator?  Where can I find a support group for people with diabetes? Where to find more information To learn more about diabetes, visit:  American Diabetes Association: www.diabetes.org  American Association of Diabetes  Educators: www.diabeteseducator.org Summary  When you have type 2 diabetes, you must make sure your blood sugar (glucose) stays in a healthy range.  Check your blood sugar every day, as often as told.  Having diabetes can raise your risk for other conditions. Your doctor may prescribe medicines to help you not have these problems.  Keep all follow-up visits as told by your doctor. This is important. This information is not intended to replace advice given to you by your health care provider. Make sure you discuss any questions you have with your health care provider. Document Revised: 05/01/2018 Document Reviewed: 12/12/2015 Elsevier Patient Education  2020 [employer]  

## 2020-01-09 NOTE — Progress Notes (Signed)
CBG 126  

## 2020-01-09 NOTE — Progress Notes (Signed)
Subjective:  Patient ID: [name], female    DOB: 1946/07/28  Age: 74 y.o. MRN: [id number]  CC: New Patient (Initial Visit)   HPI MADELLINE ESHBACH is a 74 year old female with a history of Type 2 Diabetes Mellitus (A1c 7.6), Hypertension, previous CVA here to establish care. She was previously followed by Dr Jeanie Cooks of [place].  She is accompanied by her daughter today. Not currently checking her blood sugars as she has no testing supplies. She endorses compliance with Metformin with no hypoglycemic episodes. Denies neuropathy, vision problems. Tolerating her antihypertensive and statin. With regards to her CA she has no residual deficits.  Past Medical History:  Diagnosis Date  . Diabetes mellitus   . H/O: CVA (cerebrovascular accident) 07/08/2014  . Hypertension   . Stroke [facility])     Past Surgical History:  Procedure Laterality Date  . NO PAST SURGERIES      Family History  Problem Relation Age of Onset  . Hyperlipidemia Mother   . Hypertension Mother     Allergies  Allergen Reactions  . Penicillins Itching    Outpatient Medications Prior to Visit  Medication Sig Dispense Refill  . aspirin 81 MG tablet Take 81 mg by mouth daily.     Marland Kitchen atenolol (TENORMIN) 25 MG tablet Take 1 tablet (25 mg total) by mouth daily. 30 tablet 0  . atorvastatin (LIPITOR) 10 MG tablet Take 1 tablet (10 mg total) by mouth daily. 30 tablet 0  . clopidogrel (PLAVIX) 75 MG tablet Take 1 tablet (75 mg total) by mouth daily. 30 tablet 0  . lisinopril (PRINIVIL,ZESTRIL) 5 MG tablet Take 1 tablet (5 mg total) by mouth daily. 30 tablet 0  . metFORMIN (GLUCOPHAGE) 500 MG tablet Take 1 tablet (500 mg total) by mouth 2 (two) times daily with a meal. 60 tablet 0  . omeprazole (PRILOSEC) 20 MG capsule Take 1 capsule (20 mg total) by mouth daily. 30 capsule 0  . ONE TOUCH ULTRA TEST test strip USE TO TEST BLOOD SUGAR BID  11  . simvastatin (ZOCOR) 20 MG tablet TK 1 T PO  QPM  1   . traMADol (ULTRAM) 50 MG tablet TK 1 T PO  D PRN  2  . acetaminophen (TYLENOL) 325 MG tablet Take 650 mg by mouth every 6 (six) hours as needed for pain.    Marland Kitchen VOLTAREN 1 % GEL APPLY 4 GRAMS QID PRN P  5   No facility-administered medications prior to visit.     ROS Review of Systems  Constitutional: Negative for activity change, appetite change and fatigue.  HENT: Negative for congestion, sinus pressure and sore throat.   Eyes: Negative for visual disturbance.  Respiratory: Negative for cough, chest tightness, shortness of breath and wheezing.   Cardiovascular: Negative for chest pain and palpitations.  Gastrointestinal: Negative for abdominal distention, abdominal pain and constipation.  Endocrine: Negative for polydipsia.  Genitourinary: Negative for dysuria and frequency.  Musculoskeletal: Negative for arthralgias and back pain.  Skin: Negative for rash.  Neurological: Negative for tremors, light-headedness and numbness.  Hematological: Does not bruise/bleed easily.  Psychiatric/Behavioral: Negative for agitation and behavioral problems.    Objective:  BP 140/72   Pulse 87   Ht 5\' 3"  (1.6 m)   Wt 185 lb (83.9 kg)   LMP 09/28/2014 (Approximate)   SpO2 99%   BMI 32.77 kg/m   BP/Weight 01/09/2020 02/28/8118 11/26/7827  Systolic BP 562 130 865  Diastolic BP 72 86  88  Wt. (Lbs) 185 - -  BMI 32.77 - -      Physical Exam Constitutional:      Appearance: She is well-developed.  Neck:     Vascular: No JVD.  Cardiovascular:     Rate and Rhythm: Normal rate.     Heart sounds: Normal heart sounds. No murmur.  Pulmonary:     Effort: Pulmonary effort is normal.     Breath sounds: Normal breath sounds. No wheezing or rales.  Chest:     Chest wall: No tenderness.  Abdominal:     General: Bowel sounds are normal. There is no distension.     Palpations: Abdomen is soft. There is no mass.     Tenderness: There is no abdominal tenderness.  Musculoskeletal:        General:  Normal range of motion.     Right lower leg: No edema.     Left lower leg: No edema.  Neurological:     Mental Status: She is alert and oriented to person, place, and time.  Psychiatric:        Mood and Affect: Mood normal.     CMP Latest Ref Rng & Units 09/13/2015 09/13/2015 07/08/2014  Glucose 65 - 99 mg/dL 235(T) 732(K) 025(K)  BUN 6 - 20 mg/dL 14 17 12   Creatinine 0.44 - 1.00 mg/dL ) 2.70(W) 2.37(S  Sodium 135 - 145 mmol/L 137 142 143  Potassium 3.5 - 5.1 mmol/L 4.0 4.1 4.5  Chloride 101 - 111 mmol/L 102 104 105  CO2 22 - 32 mmol/L 28 - 26  Calcium 8.9 - 10.3 mg/dL 9.0 - 9.2  Total Protein 6.5 - 8.1 g/dL 6.4(L) - 6.8  Total Bilirubin 0.3 - 1.2 mg/dL 0.6 - 0.4  Alkaline Phos 38 - 126 U/L 70 - 81  AST 15 - 41 U/L 21 - 15  ALT 14 - 54 U/L 18 - 13    Lipid Panel     Component Value Date/Time   CHOL 265 (H) 07/08/2014 1956   TRIG 239 (H) 07/08/2014 1956   HDL 38 (L) 07/08/2014 1956   CHOLHDL 7.0 07/08/2014 1956   VLDL 48 (H) 07/08/2014 1956   LDLCALC 179 (H) 07/08/2014 1956    CBC    Component Value Date/Time   WBC 8.3 09/13/2015 1304   RBC 4.97 09/13/2015 1304   HGB 13.8 09/13/2015 1304   HCT 43.2 09/13/2015 1304   PLT 249 09/13/2015 1304   MCV 86.9 09/13/2015 1304   MCH 27.8 09/13/2015 1304   MCHC 31.9 09/13/2015 1304   RDW 14.8 09/13/2015 1304   LYMPHSABS 3.2 09/13/2015 1304   MONOABS 0.5 09/13/2015 1304   EOSABS 0.2 09/13/2015 1304   BASOSABS 0.0 09/13/2015 1304   Lab Results  Component Value Date   HGBA1C 7.6 (A) 01/09/2020     Assessment & Plan:  1. Type 2 diabetes mellitus with other specified complication, without long-term current use of insulin (HCC) Not fully optimized with A1c of 7.6 Will hold off on regimen changes due to age to prevent hypoglycemia Continue current regimen Counseled on Diabetic diet, my plate method, 01/11/2020 minutes of moderate intensity exercise/week Blood sugar logs with fasting goals of 80-120 mg/dl, random of less than  151 and in the event of sugars less than 60 mg/dl or greater than 761 mg/dl encouraged to notify the clinic. Advised on the need for annual eye exams, annual foot exams, Pneumonia vaccine. - POCT glucose (manual entry) - POCT glycosylated hemoglobin (  Hb A1C) - atorvastatin (LIPITOR) 10 MG tablet; Take 1 tablet (10 mg total) by mouth daily.  Dispense: 30 tablet; Refill: 6 - metFORMIN (GLUCOPHAGE) 500 MG tablet; Take 1 tablet (500 mg total) by mouth 2 (two) times daily with a meal.  Dispense: 60 tablet; Refill: 6 - Blood Glucose Monitoring Suppl (ACCU-CHEK AVIVA) device; Use as instructed daily.  Dispense: 1 each; Refill: 0 - Lancets (ACCU-CHEK MULTICLIX) lancets; Use as instructed  Dispense: 100 each; Refill: 12 - Basic Metabolic Panel  2. Essential hypertension Stable Continue Lisinopril Counseled on blood pressure goal of less than 130/80, low-sodium, DASH diet, medication compliance, 150 minutes of moderate intensity exercise per week. Discussed medication compliance, adverse effects.  - atenolol (TENORMIN) 25 MG tablet; Take 1 tablet (25 mg total) by mouth daily.  Dispense: 30 tablet; Refill: 6 - lisinopril (ZESTRIL) 5 MG tablet; Take 1 tablet (5 mg total) by mouth daily.  Dispense: 30 tablet; Refill: 6  3. H/O: CVA (cerebrovascular accident) No residual deficits Risk factor modification - clopidogrel (PLAVIX) 75 MG tablet; Take 1 tablet (75 mg total) by mouth daily.  Dispense: 30 tablet; Refill: 6    Return in about 6 months (around 07/08/2020) for chronic medical conditions.   Hoy Register, MD, FAAFP. [facility] [place], [place] [phone]   01/09/2020, 2:05 PM

## 2020-01-10 ENCOUNTER — Telehealth: Payer: Self-pay

## 2020-01-10 ENCOUNTER — Encounter: Payer: Self-pay | Admitting: Family Medicine

## 2020-01-10 LAB — BASIC METABOLIC PANEL
BUN/Creatinine Ratio: 19 (ref 12–28)
BUN: 18 mg/dL (ref 8–27)
CO2: 23 mmol/L (ref 20–29)
Calcium: 9.5 mg/dL (ref 8.7–10.3)
Chloride: 108 mmol/L — ABNORMAL HIGH (ref 96–106)
Creatinine, Ser: 0.95 mg/dL (ref 0.57–1.00)
GFR calc Af Amer: 69 mL/min/{1.73_m2} (ref >59)
GFR calc non Af Amer: 60 mL/min/{1.73_m2} (ref >59)
Glucose: 120 mg/dL — ABNORMAL HIGH (ref 65–99)
Potassium: 4.9 mmol/L (ref 3.5–5.2)
Sodium: 146 mmol/L — ABNORMAL HIGH (ref 134–144)

## 2020-01-10 NOTE — Telephone Encounter (Signed)
Patient was called and a voicemail was left informing patient to return phone call for lab results. 

## 2020-01-10 NOTE — Telephone Encounter (Signed)
-----   Message from Hoy Register, MD sent at 01/10/2020  2:22 PM EST ----- Please inform the patient that labs are normal. Thank you.

## 2020-01-11 ENCOUNTER — Telehealth: Payer: Self-pay

## 2020-01-11 NOTE — Telephone Encounter (Signed)
-----   Message from Enobong Newlin, MD sent at 01/10/2020  2:22 PM EST ----- Please inform the patient that labs are normal. Thank you. 

## 2020-01-11 NOTE — Telephone Encounter (Signed)
Patient was called and a voicemail was left informing patient to return phone call for lab results. 

## 2020-02-15 MED FILL — metFORMIN HCL 500 MG TABS: 500 | 30 days supply | Qty: 60 | Fill #1

## 2020-02-15 MED FILL — CLOPIDOGREL 75 MG TABLET: 75 | 30 days supply | Qty: 30 | Fill #1

## 2020-02-15 MED FILL — LISINOPRIL 5 MG TABLET: 5 | 30 days supply | Qty: 30 | Fill #1

## 2020-02-15 MED FILL — ATENOLOL 25 MG TABLET: 25 | 30 days supply | Qty: 30 | Fill #1

## 2020-03-27 MED FILL — CLOPIDOGREL 75 MG TABLET: 75 | 30 days supply | Qty: 30 | Fill #2

## 2020-03-27 MED FILL — METFORMIN HCL 500 MG TABS: 500 | 30 days supply | Qty: 60 | Fill #2

## 2020-03-27 MED FILL — ATENOLOL 25 MG TABLET: 25 | 30 days supply | Qty: 30 | Fill #2

## 2020-03-27 MED FILL — LISINOPRIL 5 MG TABLET: 5 | 30 days supply | Qty: 30 | Fill #2

## 2020-06-04 MED FILL — METFORMIN HCL 500 MG TABS: 500 | 30 days supply | Qty: 60 | Fill #4

## 2020-06-04 MED FILL — LISINOPRIL 5 MG TABLET: 5 | 30 days supply | Qty: 30 | Fill #4

## 2020-06-04 MED FILL — ATORVASTATIN 10 MG TABLET: 10 | 30 days supply | Qty: 30 | Fill #2

## 2020-06-04 MED FILL — ATENOLOL 25 MG TABLET: 25 | 30 days supply | Qty: 30 | Fill #4

## 2020-06-04 MED FILL — CLOPIDOGREL 75 MG TABLET: 75 | 30 days supply | Qty: 30 | Fill #4

## 2020-08-14 MED FILL — ATENOLOL 25 MG TABLET: 25 | 30 days supply | Qty: 30 | Fill #5

## 2020-08-14 MED FILL — CLOPIDOGREL 75 MG TABLET: 75 | 30 days supply | Qty: 30 | Fill #5

## 2020-08-14 MED FILL — METFORMIN HCL 500 MG TABS: 500 | 30 days supply | Qty: 60 | Fill #5

## 2020-09-17 MED FILL — CLOPIDOGREL 75 MG TABLET: 75 | 30 days supply | Qty: 30 | Fill #6

## 2020-09-17 MED FILL — ATENOLOL 25 MG TABLET: 25 | 30 days supply | Qty: 30 | Fill #6

## 2020-09-19 MED FILL — METFORMIN HCL 500 MG TABS: 500 | 30 days supply | Qty: 60 | Fill #6

## 2020-10-29 ENCOUNTER — Other Ambulatory Visit: Payer: Self-pay | Admitting: Family Medicine

## 2020-10-29 DIAGNOSIS — I10 Essential (primary) hypertension: Secondary | ICD-10-CM

## 2020-10-29 DIAGNOSIS — Z8673 Personal history of transient ischemic attack (TIA), and cerebral infarction without residual deficits: Secondary | ICD-10-CM

## 2020-10-29 DIAGNOSIS — E1169 Type 2 diabetes mellitus with other specified complication: Secondary | ICD-10-CM

## 2020-10-29 MED FILL — ATENOLOL 25 MG TABLET: 25 | 30 days supply | Qty: 30 | Fill #0

## 2020-10-29 MED FILL — CLOPIDOGREL 75 MG TABLET: 75 | 30 days supply | Qty: 30 | Fill #0

## 2020-10-29 MED FILL — METFORMIN HCL 500 MG TABS: 500 | 15 days supply | Qty: 30 | Fill #0

## 2020-12-02 ENCOUNTER — Other Ambulatory Visit: Payer: Self-pay | Admitting: Family Medicine

## 2020-12-02 DIAGNOSIS — Z8673 Personal history of transient ischemic attack (TIA), and cerebral infarction without residual deficits: Secondary | ICD-10-CM

## 2020-12-02 DIAGNOSIS — I10 Essential (primary) hypertension: Secondary | ICD-10-CM

## 2020-12-02 DIAGNOSIS — E1169 Type 2 diabetes mellitus with other specified complication: Secondary | ICD-10-CM

## 2020-12-02 NOTE — Telephone Encounter (Signed)
Requested medication (s) are due for refill today: yes  Requested medication (s) are on the active medication list: yes  Last refill:  10/29/2020  Future visit scheduled: no  Notes to clinic:  overdue for office visit  Vm left for patient to callback to office to schedule    Requested Prescriptions  Pending Prescriptions Disp Refills   clopidogrel (PLAVIX) 75 MG tablet [Pharmacy Med Name: CLOPIDOGREL 75 MG TABLET 75 Tablet] 30 tablet 0    Sig: TAKE 1 TABLET (75 MG TOTAL) BY MOUTH DAILY.      Hematology: Antiplatelets - clopidogrel Failed - 12/02/2020  1:19 PM      Failed - Evaluate AST, ALT within 2 months of therapy initiation.      Failed - ALT in normal range and within 360 days    ALT  Date Value Ref Range Status  09/13/2015 18 14 - 54 U/L Final          Failed - AST in normal range and within 360 days    AST  Date Value Ref Range Status  09/13/2015 21 15 - 41 U/L Final          Failed - HCT in normal range and within 180 days    HCT  Date Value Ref Range Status  09/13/2015 43.2 36.0 - 46.0 % Final          Failed - HGB in normal range and within 180 days    Hemoglobin  Date Value Ref Range Status  09/13/2015 13.8 12.0 - 15.0 g/dL Final          Failed - PLT in normal range and within 180 days    Platelets  Date Value Ref Range Status  09/13/2015 249 150 - 400 K/uL Final          Failed - Valid encounter within last 6 months    Recent Outpatient Visits           10 months ago Type 2 diabetes mellitus with other specified complication, without long-term current use of insulin ([place])   Stoddard, McMillin, MD                  metFORMIN (GLUCOPHAGE) 500 MG tablet [Pharmacy Med Name: METFORMIN HCL 500 MG TABS 500 Tablet] 30 tablet 0    Sig: TAKE 1 TABLET (500 MG TOTAL) BY MOUTH 2 (TWO) TIMES DAILY WITH A MEAL.      Endocrinology:  Diabetes - Biguanides Failed - 12/02/2020  1:19 PM      Failed - HBA1C is  between 0 and 7.9 and within 180 days    HbA1c, POC (controlled diabetic range)  Date Value Ref Range Status  01/09/2020 7.6 (A) 0.0 - 7.0 % Final          Failed - Valid encounter within last 6 months    Recent Outpatient Visits           10 months ago Type 2 diabetes mellitus with other specified complication, without long-term current use of insulin ([place])   Scooba, [place], MD                Passed - Cr in normal range and within 360 days    Creatinine, Ser  Date Value Ref Range Status  01/09/2020 0.95 0.57 - 1.00 mg/dL Final          Passed - AA eGFR in normal range and  within 360 days    GFR calc Af Amer  Date Value Ref Range Status  01/09/2020 69 >59 mL/min/1.73 Final   GFR calc non Af Amer  Date Value Ref Range Status  01/09/2020 60 >59 mL/min/1.73 Final            atenolol (TENORMIN) 25 MG tablet [Pharmacy Med Name: ATENOLOL 25 MG TABLET 25 Tablet] 30 tablet 0    Sig: TAKE 1 TABLET (25 MG TOTAL) BY MOUTH DAILY.      Cardiovascular:  Beta Blockers Failed - 12/02/2020  1:19 PM      Failed - Last BP in normal range    BP Readings from Last 1 Encounters:  01/09/20 140/72          Failed - Valid encounter within last 6 months    Recent Outpatient Visits           10 months ago Type 2 diabetes mellitus with other specified complication, without long-term current use of insulin [facility])   Timnath, [place], MD                Passed - Last Heart Rate in normal range    Pulse Readings from Last 1 Encounters:  01/09/20 87

## 2020-12-11 ENCOUNTER — Other Ambulatory Visit: Payer: Self-pay | Admitting: Family Medicine

## 2020-12-11 DIAGNOSIS — Z8673 Personal history of transient ischemic attack (TIA), and cerebral infarction without residual deficits: Secondary | ICD-10-CM

## 2020-12-11 DIAGNOSIS — E1169 Type 2 diabetes mellitus with other specified complication: Secondary | ICD-10-CM

## 2020-12-11 MED ORDER — CLOPIDOGREL BISULFATE 75 MG PO TABS: 75.0000 mg | ORAL_TABLET | Freq: Every day | ORAL | 0 refills | Status: DC

## 2020-12-11 MED ORDER — ATORVASTATIN CALCIUM 10 MG PO TABS: 10.0000 mg | ORAL_TABLET | Freq: Every day | ORAL | 0 refills | Status: DC

## 2020-12-11 MED ORDER — METFORMIN HCL 500 MG PO TABS: 500.0000 mg | ORAL_TABLET | Freq: Two times a day (BID) | ORAL | 0 refills | Status: DC

## 2020-12-11 MED FILL — ATORVASTATIN 10 MG TABLET: 10 | 21 days supply | Qty: 21 | Fill #0

## 2020-12-11 MED FILL — CLOPIDOGREL 75 MG TABLET: 75 | 21 days supply | Qty: 21 | Fill #0

## 2020-12-11 MED FILL — METFORMIN HCL 500 MG TABS: 500 | 21 days supply | Qty: 42 | Fill #0

## 2020-12-11 NOTE — Telephone Encounter (Signed)
Courtesy refill given until appointment on 01/01/21.

## 2020-12-11 NOTE — Telephone Encounter (Signed)
Medication: atorvastatin (LIPITOR) 10 MG tablet metFORMIN (GLUCOPHAGE) 500 MG tablet clopidogrel (PLAVIX) 75 MG tablet Has the pt contacted their pharmacy? YES  Preferred pharmacy: [facility] [place], [place] - [address]  Please be advised refills may take up to 3 business days.  We ask that you follow up with your pharmacy.  Pt needed an appt for refills.  Pt has appt  01/01/21 and would llike a 30 day Rx of each to get her through to appt.

## 2021-01-01 ENCOUNTER — Ambulatory Visit: Payer: Medicare Other | Attending: Physician Assistant | Admitting: Physician Assistant

## 2021-01-01 ENCOUNTER — Other Ambulatory Visit: Payer: Self-pay

## 2021-01-01 ENCOUNTER — Encounter: Payer: Self-pay | Admitting: Physician Assistant

## 2021-01-01 ENCOUNTER — Other Ambulatory Visit: Payer: Self-pay | Admitting: Physician Assistant

## 2021-01-01 VITALS — BP 155/77 | HR 76 | Wt 191.4 lb

## 2021-01-01 DIAGNOSIS — Z8673 Personal history of transient ischemic attack (TIA), and cerebral infarction without residual deficits: Secondary | ICD-10-CM | POA: Diagnosis not present

## 2021-01-01 DIAGNOSIS — I10 Essential (primary) hypertension: Secondary | ICD-10-CM

## 2021-01-01 DIAGNOSIS — E1169 Type 2 diabetes mellitus with other specified complication: Secondary | ICD-10-CM | POA: Diagnosis not present

## 2021-01-01 LAB — POCT GLYCOSYLATED HEMOGLOBIN (HGB A1C): Hemoglobin A1C: 8.6 % — AB (ref 4.0–5.6)

## 2021-01-01 LAB — GLUCOSE, POCT (MANUAL RESULT ENTRY): POC Glucose: 119 mg/dl — AB (ref 70–99)

## 2021-01-01 MED ORDER — ATORVASTATIN CALCIUM 10 MG PO TABS: 10.0000 mg | ORAL_TABLET | Freq: Every day | ORAL | 3 refills | Status: DC

## 2021-01-01 MED ORDER — METFORMIN HCL 500 MG PO TABS: 1000.0000 mg | ORAL_TABLET | Freq: Two times a day (BID) | ORAL | 3 refills | Status: DC

## 2021-01-01 MED ORDER — CLOPIDOGREL BISULFATE 75 MG PO TABS: 75.0000 mg | ORAL_TABLET | Freq: Every day | ORAL | 3 refills | Status: DC

## 2021-01-01 MED ORDER — LISINOPRIL 10 MG PO TABS: 10.0000 mg | ORAL_TABLET | Freq: Every day | ORAL | 3 refills | Status: DC

## 2021-01-01 MED ORDER — ATENOLOL 25 MG PO TABS: 25.0000 mg | ORAL_TABLET | Freq: Every day | ORAL | 3 refills | Status: DC

## 2021-01-01 MED FILL — LISINOPRIL 10 MG TABS: 10 | 30 days supply | Qty: 30 | Fill #0

## 2021-01-01 MED FILL — ATENOLOL 25 MG TABLET: 25 | 30 days supply | Qty: 30 | Fill #0

## 2021-01-01 NOTE — Patient Instructions (Signed)
Check blood pressures daily and record and goal is <130/85.    Check blood sugars fasting and at bedtime and record and have available for next visit

## 2021-01-01 NOTE — Progress Notes (Signed)
Follow up.

## 2021-01-01 NOTE — Progress Notes (Signed)
Patient ID: [name], female   DOB: 04/27/1946, 75 y.o.   MRN: [id number]   Zayli Schuring, is a 76 y.o. female  [id number]  [id number]  DOB - [birth year]  Subjective:  Chief Complaint and HPI: [name] is a 75 y.o. female here today for med RF.  Out of all meds except plavix and metformin.  Does not check blood sugar or blood pressure at home.  Does not follow diabetic diet;  Drinks sodas. Her daughter is with her as communication is limited from previous stroke. No CP/HA/dizziness.  No concerns or complaints today but needs meds.  Still smoking   ROS:   Constitutional:  No f/c, No night sweats, No unexplained weight loss. EENT:  No vision changes, No blurry vision, No hearing changes. No mouth, throat, or ear problems.  Respiratory: No cough, No SOB Cardiac: No CP, no palpitations GI:  No abd pain, No N/V/D. GU: No Urinary s/sx Musculoskeletal: No joint pain Neuro: No headache, no dizziness, no motor weakness.  Skin: No rash Endocrine:  No polydipsia. No polyuria.  Psych: Denies SI/HI  No problems updated.  ALLERGIES: Allergies  Allergen Reactions  . Penicillins Itching    PAST MEDICAL HISTORY: Past Medical History:  Diagnosis Date  . Diabetes mellitus   . H/O: CVA (cerebrovascular accident) 07/08/2014  . Hypertension   . Stroke [facility])     MEDICATIONS AT HOME: Prior to Admission medications   Medication Sig Start Date End Date Taking? Authorizing Provider  acetaminophen (TYLENOL) 325 MG tablet Take 650 mg by mouth every 6 (six) hours as needed for pain.   Yes [provider]  aspirin 81 MG tablet Take 81 mg by mouth daily.    Yes [provider]  Blood Glucose Monitoring Suppl (ACCU-CHEK AVIVA) device Use as instructed daily. 01/09/20  Yes Hoy Register, MD  glucose blood (TRUE METRIX BLOOD GLUCOSE TEST) test strip Use as instructed 01/09/20  Yes Newlin, Odette Horns, MD  Lancets (ACCU-CHEK MULTICLIX) lancets Use as  instructed 01/09/20  Yes Newlin, Enobong, MD  lisinopril (ZESTRIL) 10 MG tablet Take 1 tablet (10 mg total) by mouth daily. 01/01/21  Yes Khylah Kendra, Marzella Schlein, PA-C  traMADol (ULTRAM) 50 MG tablet TK 1 T PO  D PRN 02/26/16  Yes [provider]  VOLTAREN 1 % GEL APPLY 4 GRAMS QID PRN P 02/26/16  Yes [provider]  atenolol (TENORMIN) 25 MG tablet Take 1 tablet (25 mg total) by mouth daily. 01/01/21   Anders Simmonds, PA-C  atorvastatin (LIPITOR) 10 MG tablet Take 1 tablet (10 mg total) by mouth daily. 01/01/21   Anders Simmonds, PA-C  clopidogrel (PLAVIX) 75 MG tablet Take 1 tablet (75 mg total) by mouth daily. 01/01/21   Anders Simmonds, PA-C  metFORMIN (GLUCOPHAGE) 500 MG tablet Take 2 tablets (1,000 mg total) by mouth 2 (two) times daily with a meal. 01/01/21   Anders Simmonds, PA-C     Objective:  EXAM:   Vitals:   01/01/21 1612 01/01/21 1634  BP: (!) 183/107 (!) 155/77  Pulse: 76   SpO2: 95%   Weight: 191 lb 6.4 oz (86.8 kg)     General appearance : A&OX3. NAD. Non-toxic-appearing HEENT: Atraumatic and Normocephalic.  PERRLA. EOM intact.  TM clear B. Mouth-MMM, post pharynx WNL w/o erythema, No PND. Neck: supple, no JVD. No cervical lymphadenopathy. No thyromegaly Chest/Lungs:  Breathing-non-labored, Good air entry bilaterally, breath sounds normal without rales, rhonchi, or wheezing  CVS: S1 S2 regular,  no murmurs, gallops, rubs  Abdomen: Bowel sounds present, Non tender and not distended with no gaurding, rigidity or rebound. Extremities: Bilateral Lower Ext shows no edema, both legs are warm to touch with = pulse throughout Neurology:  CN II-XII grossly intact, Non focal.   Psych:  TP linear. J/I WNL. Normal speech. Appropriate eye contact and affect.  Skin:  No Rash  Data Review Lab Results  Component Value Date   HGBA1C 8.6 (A) 01/01/2021   HGBA1C 7.6 (A) 01/09/2020   HGBA1C 8.0 (H) 07/08/2014     Assessment & Plan   1. Type 2 diabetes mellitus  with other specified complication, without long-term current use of insulin (HCC) Uncontrolled-increase dose metformin.  See avs.  Work on diabetic diet - Glucose (CBG) - HgB A1c - atorvastatin (LIPITOR) 10 MG tablet; Take 1 tablet (10 mg total) by mouth daily.  Dispense: 30 tablet; Refill: 3 - metFORMIN (GLUCOPHAGE) 500 MG tablet; Take 2 tablets (1,000 mg total) by mouth 2 (two) times daily with a meal.  Dispense: 120 tablet; Refill: 3 - Comprehensive metabolic panel - CBC with Differential/Platelet  2. Essential hypertension Uncontrolled but not on meds.  Resume meds and see avs/check BP at home - atenolol (TENORMIN) 25 MG tablet; Take 1 tablet (25 mg total) by mouth daily.  Dispense: 30 tablet; Refill: 3 - lisinopril (ZESTRIL) 10 MG tablet; Take 1 tablet (10 mg total) by mouth daily.  Dispense: 90 tablet; Refill: 3 - Comprehensive metabolic panel - CBC with Differential/Platelet  3. H/O: CVA (cerebrovascular accident) continue - atorvastatin (LIPITOR) 10 MG tablet; Take 1 tablet (10 mg total) by mouth daily.  Dispense: 30 tablet; Refill: 3 - clopidogrel (PLAVIX) 75 MG tablet; Take 1 tablet (75 mg total) by mouth daily.  Dispense: 30 tablet; Refill: 3   Patient have been counseled extensively about nutrition and exercise  Return in about 4 months (around 05/01/2021) for see Franky Macho in 3-4 weeks for BP and DM; DR Alvis Lemmings 4 months for chronic conditions.  The patient was given clear instructions to go to ER or return to medical center if symptoms don't improve, worsen or new problems develop. The patient verbalized understanding. The patient was told to call to get lab results if they haven't heard anything in the next week.     Georgian Co, PA-C [facility] [place], [place] [phone]   01/01/2021, 4:38 PM

## 2021-01-02 LAB — COMPREHENSIVE METABOLIC PANEL
ALT: 9 IU/L (ref 0–32)
AST: 15 IU/L (ref 0–40)
Albumin/Globulin Ratio: 1.8 (ref 1.2–2.2)
Albumin: 4.2 g/dL (ref 3.7–4.7)
Alkaline Phosphatase: 102 IU/L (ref 44–121)
BUN/Creatinine Ratio: 9 — ABNORMAL LOW (ref 12–28)
BUN: 9 mg/dL (ref 8–27)
Bilirubin Total: 0.3 mg/dL (ref 0.0–1.2)
CO2: 24 mmol/L (ref 20–29)
Calcium: 9.4 mg/dL (ref 8.7–10.3)
Chloride: 104 mmol/L (ref 96–106)
Creatinine, Ser: 0.99 mg/dL (ref 0.57–1.00)
GFR calc Af Amer: 65 mL/min/{1.73_m2} (ref >59)
GFR calc non Af Amer: 57 mL/min/{1.73_m2} — ABNORMAL LOW (ref >59)
Globulin, Total: 2.4 g/dL (ref 1.5–4.5)
Glucose: 141 mg/dL — ABNORMAL HIGH (ref 65–99)
Potassium: 5.1 mmol/L (ref 3.5–5.2)
Sodium: 142 mmol/L (ref 134–144)
Total Protein: 6.6 g/dL (ref 6.0–8.5)

## 2021-01-02 LAB — CBC WITH DIFFERENTIAL/PLATELET
Basophils Absolute: 0.1 10*3/uL (ref 0.0–0.2)
Basos: 1 %
EOS (ABSOLUTE): 0.1 10*3/uL (ref 0.0–0.4)
Eos: 1 %
Hematocrit: 42.2 % (ref 34.0–46.6)
Hemoglobin: 13.9 g/dL (ref 11.1–15.9)
Immature Grans (Abs): 0 10*3/uL (ref 0.0–0.1)
Immature Granulocytes: 0 %
Lymphocytes Absolute: 3.1 10*3/uL (ref 0.7–3.1)
Lymphs: 37 %
MCH: 27.6 pg (ref 26.6–33.0)
MCHC: 32.9 g/dL (ref 31.5–35.7)
MCV: 84 fL (ref 79–97)
Monocytes Absolute: 0.4 10*3/uL (ref 0.1–0.9)
Monocytes: 5 %
Neutrophils Absolute: 4.8 10*3/uL (ref 1.4–7.0)
Neutrophils: 56 %
Platelets: 331 10*3/uL (ref 150–450)
RBC: 5.04 x10E6/uL (ref 3.77–5.28)
RDW: 14.1 % (ref 11.7–15.4)
WBC: 8.5 10*3/uL (ref 3.4–10.8)

## 2021-01-06 MED FILL — ATORVASTATIN 10 MG TABLET: 10 | 30 days supply | Qty: 30 | Fill #3

## 2021-01-06 MED FILL — CLOPIDOGREL 75 MG TABLET: 75 | 30 days supply | Qty: 30 | Fill #0

## 2021-01-16 MED FILL — METFORMIN HCL 500 MG TABS: 500 | 30 days supply | Qty: 120 | Fill #0

## 2021-02-12 MED FILL — CLOPIDOGREL 75 MG TABLET: 75 | 30 days supply | Qty: 30 | Fill #1

## 2021-02-12 MED FILL — ATORVASTATIN 10 MG TABLET: 10 | 30 days supply | Qty: 30 | Fill #0

## 2021-02-16 MED FILL — METFORMIN HCL 500 MG TABS: 500 | 30 days supply | Qty: 120 | Fill #1

## 2021-02-21 ENCOUNTER — Other Ambulatory Visit: Payer: Self-pay

## 2021-03-12 ENCOUNTER — Emergency Department (HOSPITAL_COMMUNITY): Payer: Medicare Other

## 2021-03-12 ENCOUNTER — Inpatient Hospital Stay (HOSPITAL_COMMUNITY): Payer: Medicare Other

## 2021-03-12 ENCOUNTER — Encounter (HOSPITAL_COMMUNITY): Payer: Self-pay | Admitting: Radiology

## 2021-03-12 ENCOUNTER — Inpatient Hospital Stay (HOSPITAL_COMMUNITY)
Admission: EM | Admit: 2021-03-12 | Discharge: 2021-03-14 | DRG: 101 | Disposition: A | Discharge status: Home Health | Payer: Medicare Other | Attending: Internal Medicine | Admitting: Internal Medicine

## 2021-03-12 DIAGNOSIS — E538 Deficiency of other specified B group vitamins: Secondary | ICD-10-CM | POA: Diagnosis present

## 2021-03-12 DIAGNOSIS — R4182 Altered mental status, unspecified: Secondary | ICD-10-CM | POA: Diagnosis present

## 2021-03-12 DIAGNOSIS — Z20822 Contact with and (suspected) exposure to covid-19: Secondary | ICD-10-CM | POA: Diagnosis present

## 2021-03-12 DIAGNOSIS — F0390 Unspecified dementia without behavioral disturbance: Secondary | ICD-10-CM | POA: Diagnosis present

## 2021-03-12 DIAGNOSIS — R29810 Facial weakness: Secondary | ICD-10-CM

## 2021-03-12 DIAGNOSIS — R29898 Other symptoms and signs involving the musculoskeletal system: Secondary | ICD-10-CM

## 2021-03-12 DIAGNOSIS — I10 Essential (primary) hypertension: Secondary | ICD-10-CM | POA: Diagnosis present

## 2021-03-12 DIAGNOSIS — I69354 Hemiplegia and hemiparesis following cerebral infarction affecting left non-dominant side: Secondary | ICD-10-CM | POA: Diagnosis not present

## 2021-03-12 DIAGNOSIS — Z79899 Other long term (current) drug therapy: Secondary | ICD-10-CM | POA: Diagnosis not present

## 2021-03-12 DIAGNOSIS — Z7984 Long term (current) use of oral hypoglycemic drugs: Secondary | ICD-10-CM | POA: Diagnosis not present

## 2021-03-12 DIAGNOSIS — G40209 Localization-related (focal) (partial) symptomatic epilepsy and epileptic syndromes with complex partial seizures, not intractable, without status epilepticus: Secondary | ICD-10-CM

## 2021-03-12 DIAGNOSIS — Z7902 Long term (current) use of antithrombotics/antiplatelets: Secondary | ICD-10-CM

## 2021-03-12 DIAGNOSIS — Z7982 Long term (current) use of aspirin: Secondary | ICD-10-CM | POA: Diagnosis not present

## 2021-03-12 DIAGNOSIS — R404 Transient alteration of awareness: Secondary | ICD-10-CM | POA: Diagnosis not present

## 2021-03-12 DIAGNOSIS — R29720 NIHSS score 20: Secondary | ICD-10-CM | POA: Diagnosis present

## 2021-03-12 DIAGNOSIS — G40109 Localization-related (focal) (partial) symptomatic epilepsy and epileptic syndromes with simple partial seizures, not intractable, without status epilepticus: Secondary | ICD-10-CM | POA: Diagnosis present

## 2021-03-12 DIAGNOSIS — Z823 Family history of stroke: Secondary | ICD-10-CM | POA: Diagnosis not present

## 2021-03-12 DIAGNOSIS — Z8679 Personal history of other diseases of the circulatory system: Secondary | ICD-10-CM

## 2021-03-12 DIAGNOSIS — F17210 Nicotine dependence, cigarettes, uncomplicated: Secondary | ICD-10-CM | POA: Diagnosis present

## 2021-03-12 DIAGNOSIS — I69320 Aphasia following cerebral infarction: Secondary | ICD-10-CM

## 2021-03-12 DIAGNOSIS — E1165 Type 2 diabetes mellitus with hyperglycemia: Secondary | ICD-10-CM | POA: Diagnosis present

## 2021-03-12 DIAGNOSIS — R569 Unspecified convulsions: Secondary | ICD-10-CM | POA: Diagnosis not present

## 2021-03-12 DIAGNOSIS — E119 Type 2 diabetes mellitus without complications: Secondary | ICD-10-CM

## 2021-03-12 DIAGNOSIS — E785 Hyperlipidemia, unspecified: Secondary | ICD-10-CM | POA: Diagnosis present

## 2021-03-12 DIAGNOSIS — Z8673 Personal history of transient ischemic attack (TIA), and cerebral infarction without residual deficits: Secondary | ICD-10-CM

## 2021-03-12 DIAGNOSIS — Z9111 Patient's noncompliance with dietary regimen: Secondary | ICD-10-CM | POA: Diagnosis not present

## 2021-03-12 DIAGNOSIS — G459 Transient cerebral ischemic attack, unspecified: Secondary | ICD-10-CM | POA: Diagnosis not present

## 2021-03-12 HISTORY — DX: Altered mental status, unspecified: R41.82

## 2021-03-12 LAB — RESP PANEL BY RT-PCR (FLU A&B, COVID) ARPGX2
Influenza A by PCR: NEGATIVE
Influenza B by PCR: NEGATIVE
SARS Coronavirus 2 by RT PCR: NEGATIVE

## 2021-03-12 LAB — COMPREHENSIVE METABOLIC PANEL
ALT: 16 U/L (ref 0–44)
AST: 20 U/L (ref 15–41)
Albumin: 3.8 g/dL (ref 3.5–5.0)
Alkaline Phosphatase: 64 U/L (ref 38–126)
Anion gap: 10 (ref 5–15)
BUN: 10 mg/dL (ref 8–23)
CO2: 22 mmol/L (ref 22–32)
Calcium: 9 mg/dL (ref 8.9–10.3)
Chloride: 107 mmol/L (ref 98–111)
Creatinine, Ser: 1 mg/dL (ref 0.44–1.00)
GFR, Estimated: 59 mL/min — ABNORMAL LOW (ref >60)
Glucose, Bld: 168 mg/dL — ABNORMAL HIGH (ref 70–99)
Potassium: 4.5 mmol/L (ref 3.5–5.1)
Sodium: 139 mmol/L (ref 135–145)
Total Bilirubin: 0.8 mg/dL (ref 0.3–1.2)
Total Protein: 6.6 g/dL (ref 6.5–8.1)

## 2021-03-12 LAB — I-STAT CHEM 8, ED
BUN: 12 mg/dL (ref 8–23)
Calcium, Ion: 0.96 mmol/L — ABNORMAL LOW (ref 1.15–1.40)
Chloride: 106 mmol/L (ref 98–111)
Creatinine, Ser: 0.8 mg/dL (ref 0.44–1.00)
Glucose, Bld: 170 mg/dL — ABNORMAL HIGH (ref 70–99)
HCT: 43 % (ref 36.0–46.0)
Hemoglobin: 14.6 g/dL (ref 12.0–15.0)
Potassium: 4.8 mmol/L (ref 3.5–5.1)
Sodium: 138 mmol/L (ref 135–145)
TCO2: 26 mmol/L (ref 22–32)

## 2021-03-12 LAB — DIFFERENTIAL
Abs Immature Granulocytes: 0.04 10*3/uL (ref 0.00–0.07)
Basophils Absolute: 0.1 10*3/uL (ref 0.0–0.1)
Basophils Relative: 1 %
Eosinophils Absolute: 0.1 10*3/uL (ref 0.0–0.5)
Eosinophils Relative: 1 %
Immature Granulocytes: 0 %
Lymphocytes Relative: 45 %
Lymphs Abs: 4.8 10*3/uL — ABNORMAL HIGH (ref 0.7–4.0)
Monocytes Absolute: 0.5 10*3/uL (ref 0.1–1.0)
Monocytes Relative: 5 %
Neutro Abs: 5.1 10*3/uL (ref 1.7–7.7)
Neutrophils Relative %: 48 %

## 2021-03-12 LAB — RAPID HIV SCREEN (HIV 1/2 AB+AG)
HIV 1/2 Antibodies: NONREACTIVE
HIV-1 P24 Antigen - HIV24: NONREACTIVE

## 2021-03-12 LAB — CBC
HCT: 45.3 % (ref 36.0–46.0)
Hemoglobin: 14.2 g/dL (ref 12.0–15.0)
MCH: 27.5 pg (ref 26.0–34.0)
MCHC: 31.3 g/dL (ref 30.0–36.0)
MCV: 87.6 fL (ref 80.0–100.0)
Platelets: 311 10*3/uL (ref 150–400)
RBC: 5.17 MIL/uL — ABNORMAL HIGH (ref 3.87–5.11)
RDW: 14.2 % (ref 11.5–15.5)
WBC: 10.6 10*3/uL — ABNORMAL HIGH (ref 4.0–10.5)
nRBC: 0 % (ref 0.0–0.2)

## 2021-03-12 LAB — TSH: TSH: 0.785 u[IU]/mL (ref 0.350–4.500)

## 2021-03-12 LAB — PROTIME-INR
INR: 0.9 (ref 0.8–1.2)
Prothrombin Time: 12.6 seconds (ref 11.4–15.2)

## 2021-03-12 LAB — APTT: aPTT: 23 seconds — ABNORMAL LOW (ref 24–36)

## 2021-03-12 LAB — CBG MONITORING, ED: Glucose-Capillary: 153 mg/dL — ABNORMAL HIGH (ref 70–99)

## 2021-03-12 MED ORDER — ASPIRIN 81 MG PO CHEW: 81.0000 mg | CHEWABLE_TABLET | Freq: Every morning | ORAL | Status: DC

## 2021-03-12 MED ORDER — CLOPIDOGREL BISULFATE 75 MG PO TABS: 75.0000 mg | ORAL_TABLET | Freq: Every day | ORAL | Status: DC

## 2021-03-12 MED ORDER — ENOXAPARIN SODIUM 40 MG/0.4ML ~~LOC~~ SOLN
40.0000 mg | SUBCUTANEOUS | Status: DC
  Administered 2021-03-12 – 2021-03-13 (×2): 40 mg via SUBCUTANEOUS
  Filled 2021-03-12 (×2): qty 0.4

## 2021-03-12 MED ORDER — CLOPIDOGREL BISULFATE 75 MG PO TABS
75.0000 mg | ORAL_TABLET | Freq: Every day | ORAL | Status: DC
  Administered 2021-03-12 – 2021-03-14 (×3): 75 mg via ORAL
  Filled 2021-03-12 (×3): qty 1

## 2021-03-12 MED ORDER — ASPIRIN EC 81 MG PO TBEC
81.0000 mg | DELAYED_RELEASE_TABLET | Freq: Every day | ORAL | Status: DC
  Administered 2021-03-12 – 2021-03-14 (×3): 81 mg via ORAL
  Filled 2021-03-12 (×3): qty 1

## 2021-03-12 MED ORDER — SODIUM CHLORIDE 0.9 % IV SOLN: INTRAVENOUS | Status: DC

## 2021-03-12 MED ORDER — LISINOPRIL 10 MG PO TABS: 10.0000 mg | ORAL_TABLET | Freq: Every day | ORAL | Status: DC

## 2021-03-12 MED ORDER — STROKE: EARLY STAGES OF RECOVERY BOOK
Freq: Once | Status: AC
  Filled 2021-03-12: qty 1

## 2021-03-12 MED ORDER — IOHEXOL 350 MG/ML SOLN
50.0000 mL | Freq: Once | INTRAVENOUS | Status: AC | PRN
  Administered 2021-03-12: 50 mL via INTRAVENOUS

## 2021-03-12 MED ORDER — SENNOSIDES-DOCUSATE SODIUM 8.6-50 MG PO TABS: 1.0000 | ORAL_TABLET | Freq: Every evening | ORAL | Status: DC | PRN

## 2021-03-12 MED ORDER — NICOTINE 7 MG/24HR TD PT24
7.0000 mg | MEDICATED_PATCH | Freq: Every day | TRANSDERMAL | Status: DC
  Administered 2021-03-12 – 2021-03-14 (×3): 7 mg via TRANSDERMAL
  Filled 2021-03-12 (×3): qty 1

## 2021-03-12 MED ORDER — SODIUM CHLORIDE 0.9% FLUSH: 3.0000 mL | Freq: Once | INTRAVENOUS | Status: DC

## 2021-03-12 MED ORDER — INSULIN ASPART 100 UNIT/ML ~~LOC~~ SOLN
0.0000 [IU] | Freq: Three times a day (TID) | SUBCUTANEOUS | Status: DC
  Administered 2021-03-13: 5 [IU] via SUBCUTANEOUS
  Administered 2021-03-14: 2 [IU] via SUBCUTANEOUS

## 2021-03-12 MED ORDER — ATORVASTATIN CALCIUM 10 MG PO TABS
10.0000 mg | ORAL_TABLET | Freq: Every day | ORAL | Status: DC
  Administered 2021-03-13 – 2021-03-14 (×2): 10 mg via ORAL
  Filled 2021-03-12 (×2): qty 1

## 2021-03-12 MED ORDER — ATENOLOL 25 MG PO TABS: 25.0000 mg | ORAL_TABLET | Freq: Every day | ORAL | Status: DC

## 2021-03-12 MED ORDER — ACETAMINOPHEN 325 MG PO TABS
650.0000 mg | ORAL_TABLET | Freq: Four times a day (QID) | ORAL | Status: DC | PRN
  Administered 2021-03-12: 650 mg via ORAL
  Filled 2021-03-12: qty 2

## 2021-03-12 MED ORDER — LABETALOL HCL 100 MG PO TABS: 100.0000 mg | ORAL_TABLET | Freq: Four times a day (QID) | ORAL | Status: DC | PRN

## 2021-03-12 NOTE — ED Triage Notes (Signed)
Pt bib [facility] from home after pt was sitting outside and started having foot pain, son noticed L side became weak. Pt became altered, was not speaking. LKN 1415, hx prior strokes

## 2021-03-12 NOTE — Code Documentation (Signed)
Stroke Response Nurse Documentation Code Documentation  [name] is a 75 y.o. female arriving to [facility] ED via [facility] on 03/12/2021 with past medical hx of stroke, diabetes, HTN. Code stroke was activated by EMS. Patient from home where she was LKW at 1415 and now complaining of not following commands and generalized weakness left > right . On clopidogrel 75 mg daily. Patient was sitting in her backyard with her son smoking a cigarette when she started to complain of some left sided weakness and he reports that she wasn't acting herself. EMS got there and said that she would follow some commands and then not others. Activated a Code Stroke due to sudden change in mental status and weakness.  Stroke team at the bedside on patient arrival. Labs drawn and patient cleared for CT by Dr. Hyacinth Meeker. Patient to CT with team. NIHSS 20, see documentation for details and code stroke times. Patient with disoriented, not following commands, bilateral arm weakness, bilateral leg weakness, Global aphasia  and dysarthria  on exam. The following imaging was completed: CT, CTA head and neck. Patient is not a candidate for tPA due to stroke not suspected and MRI not showing stroke per MD Bhagat. Pt's exam was inconsistent.  Bedside handoff with ED RN [name].    [name]  Stroke Response RN

## 2021-03-12 NOTE — ED Provider Notes (Signed)
[facility] Provider Note   CSN: [id number] Arrival date & time: 03/12/21  1451  An emergency department physician performed an initial assessment on this suspected stroke patient at 1452.  History Chief Complaint  Patient presents with  . Code Stroke    [name] is a 75 y.o. female.  HPI Patient is a 75 year old female with a medical history diabetes, hypertension, CVA presenting with a chief complaint of altered mental status.  Notably, patient was sitting outside on her deck when she became aphasic and had left-sided weakness per the family.  On arrival here, symptoms are maintained.  Patient unable to provide any further history at this time.  Recent chart review reveals multiple medical comorbidities and intermittent medical compliance. Per EMS and family, onset was within approximately 1 hour prior to arrival patient was within normal limits this morning.  Baseline unestablished at this point.  Past Medical History:  Diagnosis Date  . Diabetes mellitus   . H/O: CVA (cerebrovascular accident) 07/08/2014  . Hypertension   . Stroke [facility](HCC)     Patient Active Problem List   Diagnosis Date Noted  . AMS (altered mental status) 03/12/2021  . DM type 2 (diabetes mellitus, type 2) (HCC) 07/09/2014  . Facial weakness 07/08/2014  . Left leg weakness 07/08/2014  . H/O: CVA (cerebrovascular accident) 07/08/2014  . 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 Stroke (HCC)   . Hypertension     Past Surgical History:  Procedure Laterality Date  . NO PAST SURGERIES       OB History    Gravida  4   Para  4   Term  4   Preterm  0   AB  0   Living  3     SAB  0   IAB  0   Ectopic  0   Multiple  0   Live Births              Family History  Problem Relation Age of Onset  . Hyperlipidemia Mother   . Hypertension Mother     Social History   Tobacco Use  . Smoking status: Current Every Day Smoker    Packs/day:  1.00    Years: 48.00    Pack years: 48.00    Types: Cigarettes  . Smokeless tobacco: Never Used  Substance Use Topics  . Alcohol use: No  . Drug use: No    Home Medications Prior to Admission medications   Medication Sig Start Date End Date Taking? Authorizing Provider  acetaminophen (TYLENOL) 325 MG tablet Take 650 mg by mouth every 6 (six) hours as needed for pain.   Yes [provider]  aspirin 81 MG chewable tablet Chew 81 mg by mouth in the morning.   Yes [provider]  atenolol (TENORMIN) 25 MG tablet TAKE 1 TABLET (25 MG TOTAL) BY MOUTH DAILY. Patient taking differently: Take 25 mg by mouth daily. 01/01/21 01/01/22 Yes McClung, Marzella SchleinAngela M, PA-C  atorvastatin (LIPITOR) 10 MG tablet TAKE 1 TABLET (10 MG TOTAL) BY MOUTH DAILY. Patient taking differently: Take 10 mg by mouth daily. 01/01/21 01/01/22 Yes McClung, Marzella SchleinAngela M, PA-C  clopidogrel (PLAVIX) 75 MG tablet TAKE 1 TABLET (75 MG TOTAL) BY MOUTH DAILY. Patient taking differently: Take 75 mg by mouth daily. 01/01/21 01/01/22 Yes McClung, Marzella SchleinAngela M, PA-C  lisinopril (ZESTRIL) 10 MG tablet TAKE 1 TABLET (10 MG TOTAL) BY MOUTH DAILY. Patient taking differently: Take 10 mg by mouth daily.  01/01/21 01/01/22 Yes McClung, Marzella Schlein, PA-C  metFORMIN (GLUCOPHAGE) 500 MG tablet TAKE 2 TABLETS (1,000 MG TOTAL) BY MOUTH 2 (TWO) TIMES DAILY WITH A MEAL. Patient taking differently: Take 500 mg by mouth 2 (two) times daily with a meal. 01/01/21 01/01/22 Yes McClung, Marzella Schlein, PA-C  VOLTAREN 1 % GEL Apply 4 g topically 4 (four) times daily as needed (to affected areas- for pain). 02/26/16  Yes [provider]  Blood Glucose Monitoring Suppl (ACCU-CHEK AVIVA) device Use as instructed daily. 01/09/20   Hoy Register, MD  glucose blood (TRUE METRIX BLOOD GLUCOSE TEST) test strip Use as instructed 01/09/20   Hoy Register, MD  Lancets (ACCU-CHEK MULTICLIX) lancets Use as instructed 01/09/20   Hoy Register, MD  traMADol (ULTRAM) 50 MG  tablet TK 1 T PO  D PRN Patient not taking: No sig reported 02/26/16   [provider]    Allergies    Penicillins and Sulfa antibiotics  Review of Systems   Review of Systems  Unable to perform ROS: Mental status change    Physical Exam Updated Vital Signs BP 140/74   Pulse 94   Temp 98.2 F (36.8 C) (Oral)   Resp 16   Wt 84 kg   LMP 09/28/2014 (Approximate)   SpO2 98%   BMI 32.80 kg/m   Physical Exam Vitals and nursing note reviewed.  Constitutional:      General: She is not in acute distress.    Appearance: She is well-developed.  HENT:     Head: Normocephalic and atraumatic.  Eyes:     Conjunctiva/sclera: Conjunctivae normal.  Cardiovascular:     Rate and Rhythm: Normal rate and regular rhythm.     Heart sounds: No murmur heard.   Pulmonary:     Effort: Pulmonary effort is normal. No respiratory distress.     Breath sounds: Normal breath sounds.  Abdominal:     Palpations: Abdomen is soft.     Tenderness: There is no abdominal tenderness.  Musculoskeletal:     Cervical back: Neck supple.  Skin:    General: Skin is warm and dry.  Neurological:     Mental Status: She is alert.     Comments: Neurologic exam notable for patient unresponsive to questioning.  Patient is alert however. This seems to be a change from her baseline. No withdrawal to pain on the left arm or left leg.     ED Results / Procedures / Treatments   Labs (all labs ordered are listed, but only abnormal results are displayed) Labs Reviewed  APTT - Abnormal; Notable for the following components:      Result Value   aPTT 23 (*)    All other components within normal limits  CBC - Abnormal; Notable for the following components:   WBC 10.6 (*)    RBC 5.17 (*)    All other components within normal limits  DIFFERENTIAL - Abnormal; Notable for the following components:   Lymphs Abs 4.8 (*)    All other components within normal limits  COMPREHENSIVE METABOLIC PANEL - Abnormal;  Notable for the following components:   Glucose, Bld 168 (*)    GFR, Estimated 59 (*)    All other components within normal limits  I-STAT CHEM 8, ED - Abnormal; Notable for the following components:   Glucose, Bld 170 (*)    Calcium, Ion 0.96 (*)    All other components within normal limits  CBG MONITORING, ED - Abnormal; Notable for the following components:  Glucose-Capillary 153 (*)    All other components within normal limits  RESP PANEL BY RT-PCR (FLU A&B, COVID) ARPGX2  PROTIME-INR  TSH  RAPID HIV SCREEN (HIV 1/2 AB+AG)  URINALYSIS, ROUTINE W REFLEX MICROSCOPIC  VITAMIN B12  VITAMIN B1  RPR  HEMOGLOBIN A1C  LIPID PANEL    EKG EKG Interpretation  Date/Time:  Thursday March 12 2021 16:12:46 EDT Ventricular Rate:  84 PR Interval:  152 QRS Duration: 92 QT Interval:  393 QTC Calculation: 465 R Axis:   72 Text Interpretation: Sinus rhythm Low voltage, precordial leads No significant change since last tracing Confirmed by Richardean Canal [id number]) on 03/12/2021 4:15:43 PM   Radiology MR BRAIN WO CONTRAST  Result Date: 03/12/2021 CLINICAL DATA:  Neuro deficit, acute stroke suspected. EXAM: MRI HEAD WITHOUT CONTRAST TECHNIQUE: Multiplanar, multiecho pulse sequences of the brain and surrounding structures were obtained without intravenous contrast. COMPARISON:  MRI July 08, 2014. FINDINGS: Brain: No acute infarction, hemorrhage, hydrocephalus, extra-axial collection or mass lesion. Similar large left frontal lobe infarct with associated encephalomalacia. Similar additional infarcts in the right occipital lobe, left parietal lobe, right cerebellum, and bilateral basal ganglia. Additional confluent and patchy T2/FLAIR hyperintensities within the white matter are compatible with chronic microvascular ischemic disease. Advanced generalized atrophy with ex vacuo ventricular dilation, similar to prior. Similar evidence of superficial siderosis with susceptibility along the cerebral  convexities. Vascular: Absent left M1 MCA flow void, compatible with known occlusion better characterized on same day CTA. Skull and upper cervical spine: Normal marrow signal. Sinuses/Orbits: Clear sinuses.  Unremarkable orbits. Other: No mastoid effusions. IMPRESSION: 1. No evidence of acute intracranial abnormality. Specifically, no acute infarct. 2. 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 3. Chronic left M1 occlusion better characterized on same day CTA. Electronically Signed   By: Feliberto Harts MD   On: 03/12/2021 17:14   DG Chest Port 1 View  Result Date: 03/12/2021 CLINICAL DATA:  Altered mental status EXAM: PORTABLE CHEST 1 VIEW COMPARISON:  03/10/2018 FINDINGS: The heart size and mediastinal contours are within normal limits. Both lungs are clear. The visualized skeletal structures are unremarkable. IMPRESSION: No active disease. Electronically Signed   By: Charlett Nose M.D.   On: 03/12/2021 19:50   CT HEAD CODE STROKE WO CONTRAST  Result Date: 03/12/2021 CLINICAL DATA:  Code stroke.  Altered mental status, aphasia EXAM: CT HEAD WITHOUT CONTRAST CT ANGIOGRAPHY OF THE HEAD AND NECK TECHNIQUE: Contiguous axial images were obtained from the base of the skull through the vertex without intravenous contrast. Multidetector CT imaging of the head and neck was performed using the standard protocol during bolus administration of intravenous contrast. Multiplanar CT image reconstructions and MIPs were obtained to evaluate the vascular anatomy. Carotid stenosis measurements (when applicable) are obtained utilizing NASCET criteria, using the distal internal carotid diameter as the denominator. CONTRAST:  28mL OMNIPAQUE IOHEXOL 350 MG/ML SOLN COMPARISON:  None. FINDINGS: CT HEAD Brain: There is no acute intracranial hemorrhage, mass effect, or edema. No acute appearing loss of gray-white differentiation. There is a chronic infarct of the parasagittal left  frontal lobe. Chronic infarct of the left basal ganglia and adjacent white matter. Chronic infarcts of the posterior insula, left occipito-temporal junction, right cerebellum, and right occipital lobe. Additional patchy hypoattenuation in the supratentorial white matter is nonspecific but probably reflects chronic microvascular ischemic changes. There is no extra-axial fluid collection. Prominence of the ventricles and sulci reflects generalized parenchymal volume loss superimposed on ex vacuo dilatation related to  infarcts. Vascular: No hyperdense vessel. Skull: Calvarium is unremarkable. Sinuses/Orbits: No acute finding. Other: None. Review of the MIP images confirms the above findings CTA NECK Aortic arch: Calcified and irregular noncalcified plaque along the arch and at the patent great vessel origins. Plaque is greatest at the left subclavian origin with less than 50% stenosis. Right carotid system: Patent. Eccentric noncalcified plaque at the ICA origin causing less than 50% stenosis. Retropharyngeal course of the ICA. Left carotid system: Patent. No stenosis at the ICA origin. Relatively smaller caliber of the left cervical ICA likely due to intracranial findings. Vertebral arteries: Patent and codominant. Skeleton: Degenerative changes of the cervical spine primarily at C5-C6. Other neck: Negative. Upper chest: Included lungs are clear. Review of the MIP images confirms the above findings CTA HEAD Anterior circulation: Intracranial internal carotid arteries are patent with calcified plaque causing mild stenosis. Anterior cerebral arteries are patent. Anterior communicating artery is present. Distal ACA branch atherosclerotic irregularity. Right middle cerebral artery is patent. Atherosclerotic irregularity of right M2 and more distal branches with areas of moderate stenosis identified. There is likely chronic occlusion of the left middle cerebral artery with collateral formation. Posterior circulation:  Intracranial vertebral arteries are patent. Basilar artery is patent. Major cerebellar artery origins are patent. Posterior cerebral arteries are patent. Venous sinuses: Patent as allowed by contrast bolus timing. Review of the MIP images confirms the above findings IMPRESSION: There is no acute intracranial hemorrhage or evidence of acute infarction. Multiple chronic infarcts. No occlusion or hemodynamically significant stenosis in the neck. 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 Medium and small branch anterior circulation atherosclerosis. Initial results were communicated to Dr. Iver Nestle at 3:22 pm on 03/12/2021 by text page via the [facility] messaging system. Electronically Signed   By: Guadlupe Spanish M.D.   On: 03/12/2021 15:32   CT ANGIO HEAD CODE STROKE  Result Date: 03/12/2021 CLINICAL DATA:  Code stroke.  Altered mental status, aphasia EXAM: CT HEAD WITHOUT CONTRAST CT ANGIOGRAPHY OF THE HEAD AND NECK TECHNIQUE: Contiguous axial images were obtained from the base of the skull through the vertex without intravenous contrast. Multidetector CT imaging of the head and neck was performed using the standard protocol during bolus administration of intravenous contrast. Multiplanar CT image reconstructions and MIPs were obtained to evaluate the vascular anatomy. Carotid stenosis measurements (when applicable) are obtained utilizing NASCET criteria, using the distal internal carotid diameter as the denominator. CONTRAST:  50mL OMNIPAQUE IOHEXOL 350 MG/ML SOLN COMPARISON:  None. FINDINGS: CT HEAD Brain: There is no acute intracranial hemorrhage, mass effect, or edema. No acute appearing loss of gray-white differentiation. There is a chronic infarct of the parasagittal left frontal lobe. Chronic infarct of the left basal ganglia and adjacent white matter. Chronic infarcts of the posterior insula, left occipito-temporal junction, right cerebellum, and right occipital lobe. Additional patchy  hypoattenuation in the supratentorial white matter is nonspecific but probably reflects chronic microvascular ischemic changes. There is no extra-axial fluid collection. Prominence of the ventricles and sulci reflects generalized parenchymal volume loss superimposed on ex vacuo dilatation related to infarcts. Vascular: No hyperdense vessel. Skull: Calvarium is unremarkable. Sinuses/Orbits: No acute finding. Other: None. Review of the MIP images confirms the above findings CTA NECK Aortic arch: Calcified and irregular noncalcified plaque along the arch and at the patent great vessel origins. Plaque is greatest at the left subclavian origin with less than 50% stenosis. Right carotid system: Patent. Eccentric noncalcified plaque at the ICA origin causing less than 50% stenosis. Retropharyngeal  course of the ICA. Left carotid system: Patent. No stenosis at the ICA origin. Relatively smaller caliber of the left cervical ICA likely due to intracranial findings. Vertebral arteries: Patent and codominant. Skeleton: Degenerative changes of the cervical spine primarily at C5-C6. Other neck: Negative. Upper chest: Included lungs are clear. Review of the MIP images confirms the above findings CTA HEAD Anterior circulation: Intracranial internal carotid arteries are patent with calcified plaque causing mild stenosis. Anterior cerebral arteries are patent. Anterior communicating artery is present. Distal ACA branch atherosclerotic irregularity. Right middle cerebral artery is patent. Atherosclerotic irregularity of right M2 and more distal branches with areas of moderate stenosis identified. There is likely chronic occlusion of the left middle cerebral artery with collateral formation. Posterior circulation: Intracranial vertebral arteries are patent. Basilar artery is patent. Major cerebellar artery origins are patent. Posterior cerebral arteries are patent. Venous sinuses: Patent as allowed by contrast bolus timing. Review of  the MIP images confirms the above findings IMPRESSION: There is no acute intracranial hemorrhage or evidence of acute infarction. Multiple chronic infarcts. No occlusion or hemodynamically significant stenosis in the neck. 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 Medium and small branch anterior circulation atherosclerosis. Initial results were communicated to Dr. Iver Nestle at 3:22 pm on 03/12/2021 by text page via the [facility] messaging system. Electronically Signed   By: Guadlupe Spanish M.D.   On: 03/12/2021 15:32   CT ANGIO NECK CODE STROKE  Result Date: 03/12/2021 CLINICAL DATA:  Code stroke.  Altered mental status, aphasia EXAM: CT HEAD WITHOUT CONTRAST CT ANGIOGRAPHY OF THE HEAD AND NECK TECHNIQUE: Contiguous axial images were obtained from the base of the skull through the vertex without intravenous contrast. Multidetector CT imaging of the head and neck was performed using the standard protocol during bolus administration of intravenous contrast. Multiplanar CT image reconstructions and MIPs were obtained to evaluate the vascular anatomy. Carotid stenosis measurements (when applicable) are obtained utilizing NASCET criteria, using the distal internal carotid diameter as the denominator. CONTRAST:  50mL OMNIPAQUE IOHEXOL 350 MG/ML SOLN COMPARISON:  None. FINDINGS: CT HEAD Brain: There is no acute intracranial hemorrhage, mass effect, or edema. No acute appearing loss of gray-white differentiation. There is a chronic infarct of the parasagittal left frontal lobe. Chronic infarct of the left basal ganglia and adjacent white matter. Chronic infarcts of the posterior insula, left occipito-temporal junction, right cerebellum, and right occipital lobe. Additional patchy hypoattenuation in the supratentorial white matter is nonspecific but probably reflects chronic microvascular ischemic changes. There is no extra-axial fluid collection. Prominence of the ventricles and sulci reflects generalized  parenchymal volume loss superimposed on ex vacuo dilatation related to infarcts. Vascular: No hyperdense vessel. Skull: Calvarium is unremarkable. Sinuses/Orbits: No acute finding. Other: None. Review of the MIP images confirms the above findings CTA NECK Aortic arch: Calcified and irregular noncalcified plaque along the arch and at the patent great vessel origins. Plaque is greatest at the left subclavian origin with less than 50% stenosis. Right carotid system: Patent. Eccentric noncalcified plaque at the ICA origin causing less than 50% stenosis. Retropharyngeal course of the ICA. Left carotid system: Patent. No stenosis at the ICA origin. Relatively smaller caliber of the left cervical ICA likely due to intracranial findings. Vertebral arteries: Patent and codominant. Skeleton: Degenerative changes of the cervical spine primarily at C5-C6. Other neck: Negative. Upper chest: Included lungs are clear. Review of the MIP images confirms the above findings CTA HEAD Anterior circulation: Intracranial internal carotid arteries are patent with calcified plaque  causing mild stenosis. Anterior cerebral arteries are patent. Anterior communicating artery is present. Distal ACA branch atherosclerotic irregularity. Right middle cerebral artery is patent. Atherosclerotic irregularity of right M2 and more distal branches with areas of moderate stenosis identified. There is likely chronic occlusion of the left middle cerebral artery with collateral formation. Posterior circulation: Intracranial vertebral arteries are patent. Basilar artery is patent. Major cerebellar artery origins are patent. Posterior cerebral arteries are patent. Venous sinuses: Patent as allowed by contrast bolus timing. Review of the MIP images confirms the above findings IMPRESSION: There is no acute intracranial hemorrhage or evidence of acute infarction. Multiple chronic infarcts. No occlusion or hemodynamically significant stenosis in the neck. Chronic  left M1 MCA occlusion with collateral formation. Medium and small branch anterior circulation atherosclerosis. Initial results were communicated to Dr. Iver Nestle at 3:22 pm on 03/12/2021 by text page via the [facility] messaging system. Electronically Signed   By: Guadlupe Spanish M.D.   On: 03/12/2021 15:32    Procedures Procedures   Medications Ordered in ED Medications  sodium chloride flush (NS) 0.9 % injection 3 mL (3 mLs Intravenous Not Given 03/12/21 1638)  clopidogrel (PLAVIX) tablet 75 mg (75 mg Oral Given 03/12/21 2114)  aspirin EC tablet 81 mg (81 mg Oral Given 03/12/21 2114)  acetaminophen (TYLENOL) tablet 650 mg (has no administration in time range)  atorvastatin (LIPITOR) tablet 10 mg (has no administration in time range)  0.9 %  sodium chloride infusion ( Intravenous New Bag/Given 03/12/21 2111)  senna-docusate (Senokot-S) tablet 1 tablet (has no administration in time range)  enoxaparin (LOVENOX) injection 40 mg (40 mg Subcutaneous Given 03/12/21 2115)  insulin aspart (novoLOG) injection 0-15 Units (has no administration in time range)  labetalol (NORMODYNE) tablet 100 mg (has no administration in time range)  nicotine (NICODERM CQ - dosed in mg/24 hr) patch 7 mg (7 mg Transdermal Patch Applied 03/12/21 2112)  iohexol (OMNIPAQUE) 350 MG/ML injection 50 mL (50 mLs Intravenous Contrast Given 03/12/21 1520)   stroke: mapping our early stages of recovery book ( Does not apply Given 03/12/21 2111)    ED Course  I have reviewed the triage vital signs and the nursing notes.  Pertinent labs & imaging results that were available during my care of the patient were reviewed by me and considered in my medical decision making (see chart for details).    MDM Rules/Calculators/A&P                          Patient is a 75 year old female with a history of strokes and hypertension presenting with a chief complaint of aphasia.  Patient treated as a tier 1 code stroke given the recency of onset  approximately 1 hour prior to arrival.  On arrival, neurology and emergency medicine evaluated patient at doorway.  Patient stable for transfer back to CT scanner.  Code stroke evaluation including labs and scan as well as progression to CTA MRI with a chronic M1 cutoff without any acute infarctions on MRI.  Neurology recommended restratification and further evaluation inpatient. Evaluation for alternative etiologies of altered mental status initiated with urinalysis and TSH as well as screening labs.  Communicated with medicine team who accepted patient admission with plan for neurology continued consultation.  Patient admitted at this time.  Final Clinical Impression(s) / ED Diagnoses Final diagnoses:  AMS (altered mental status)    Rx / DC Orders ED Discharge Orders    None  Glyn Ade, MD 03/12/21 2148    Charlynne Pander, MD 03/14/21 [phone]

## 2021-03-12 NOTE — Progress Notes (Signed)
EEG complete - results pending 

## 2021-03-12 NOTE — Consult Note (Addendum)
Neurology consult   CC: Code Stroke  History is obtained from: daughter, grandson, EMS  HPI: [name] is a 75 yo female with a PMHx of multiple strokes on Plavix and ASA, DM II with non adherence with diet, HTN, and HLD. Per daughter she has had a seizure before that was "staring". Has never been on AEDs.  Patient was brought from home by EMS as a code stroke. She had symptoms consistent with a LVO. CBG 196 and BP 178/90 in the field. EMS called LKW as 1415.   Her grandson was with her when event occurred. Per grandson, patient was her normal self at 1400 hours and was outside smoking. She yelled at him at 1405 because she was dragging her leg. Grandson called patient's daughter (his mom) and was told to call 911. Therefore, her LKW was 1400 hours.   In getting history from daughter stated the patient has had many strokes in the past with the first one being at age 54. She also had a "staring" seizure years ago, but has never been on AEDs. Per daughter, patient can shower, dress herself, and perform all of her ALDs independently. Is able to cook and shop for groceries with assistance. Patient does not drive or take care of finances. Daughter states sequelae of old strokes includes waxing and waning expressive and receptive aphasia and left sided weakness. Daughter states her comprehension is delayed at times and some Dr. told her patient had early dementia. Daughter states that her mom does not talk that much at home. She is able to read, but not write since her strokes.    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 At one point, she was on Coumadin and Plavix, but this was changed somewhere along the line. NP can not find documentation in Epic for any other stroke after 2012.     LKW: 1400 hours tpa given?: No, no stroke.  IR Thrombectomy? No, no LVO MRS: 2  NIHSS:  1a Level of Conscious: 0 1b LOC Questions: 2 1c  LOC Commands: 2 2 Best Gaze: 0 3 Visual: 0 4 Facial Palsy: 0 5a Motor Arm - left: 3 5b Motor Arm - Right: 3 6a Motor Leg - Left: 3 6b Motor Leg - Right: 3 7 Limb Ataxia: 0 8 Sensory: 0 9 Best Language: 2 10 Dysarthria: 2 11 Extinct. and Inatten: 0 TOTAL:  20  ROS: A robust ROS was performed and is negative except as noted in the HPI.  Past Medical History:  Diagnosis Date  . Diabetes mellitus   . H/O: CVA (cerebrovascular accident) 07/08/2014  . Hypertension   . Stroke [facility])      Family History  Problem Relation Age of Onset  . Hyperlipidemia Mother   . Hypertension Mother     Social History:  reports that she has been smoking cigarettes. She has a 48.00 pack-year smoking history. She has never used smokeless tobacco. She reports that she does not drink alcohol and does not use drugs.   Prior to Admission medications   Medication Sig Start Date End Date Taking? Authorizing Provider  acetaminophen (TYLENOL) 325 MG tablet Take 650 mg by mouth every 6 (six) hours as needed for pain.    [provider]  aspirin 81 MG tablet Take 81 mg by mouth daily.     [provider]  atenolol (TENORMIN) 25 MG tablet TAKE 1 TABLET (25 MG  TOTAL) BY MOUTH DAILY. 01/01/21 01/01/22  Anders Simmonds, PA-C  atorvastatin (LIPITOR) 10 MG tablet TAKE 1 TABLET (10 MG TOTAL) BY MOUTH DAILY. 01/01/21 01/01/22  Anders Simmonds, PA-C  Blood Glucose Monitoring Suppl (ACCU-CHEK AVIVA) device Use as instructed daily. 01/09/20   Hoy Register, MD  clopidogrel (PLAVIX) 75 MG tablet TAKE 1 TABLET (75 MG TOTAL) BY MOUTH DAILY. 01/01/21 01/01/22  Anders Simmonds, PA-C  glucose blood (TRUE METRIX BLOOD GLUCOSE TEST) test strip Use as instructed 01/09/20   Hoy Register, MD  Lancets (ACCU-CHEK MULTICLIX) lancets Use as instructed 01/09/20   Hoy Register, MD  lisinopril (ZESTRIL) 10 MG tablet TAKE 1 TABLET (10 MG TOTAL) BY MOUTH DAILY. 01/01/21 01/01/22  Anders Simmonds, PA-C  metFORMIN  (GLUCOPHAGE) 500 MG tablet TAKE 2 TABLETS (1,000 MG TOTAL) BY MOUTH 2 (TWO) TIMES DAILY WITH A MEAL. 01/01/21 01/01/22  Anders Simmonds, PA-C  traMADol (ULTRAM) 50 MG tablet TK 1 T PO  D PRN 02/26/16   [provider]  VOLTAREN 1 % GEL APPLY 4 GRAMS QID PRN P 02/26/16   [provider]    Exam: Current vital signs: BP (!) 177/68 (BP Location: Left Arm)   Pulse 70   Resp 16   Wt 84 kg   LMP 09/28/2014 (Approximate)   BMI 32.80 kg/m   Physical Exam  Constitutional: Appears well-developed and well-nourished.  Psych: Affect appropriate to situation Eyes: No scleral injection HENT: No OP obstrucion Head: Normocephalic.  Cardiovascular: Normal rate and regular rhythm.  Respiratory: Effort normal  GI: Soft.  No distension. There is no tenderness.  Skin: WDI  Neuro: Mental Status: Patient is awake, alert but due to aphasia, is unable to answer questions. Not following commands in CT.  Patient is unable to give a clear and coherent history. + aphasia. No neglect.  Speech/Language: mute Cranial Nerves: II: Visual Fields are full. Pupils are equal, round, and reactive to light.  III,IV, VI: EOMI without ptosis  V: Facial sensation is symmetric to temperature VII: Facial movement is symmetric.  VIII: hearing is intact to voice X: will not open mouth XI: Shoulder shrug is symmetric. XII: tongue is midline without atrophy or fasciculations.  Motor: Tone is increased but not rigid. Bulk is normal. Grips are 3+. Triceps/Biceps 3+ on right, 3 on left. LEs-will not participate Sensory: Sensation is symmetric to light touch Plantars: Toes are downgoing bilaterally.    I have reviewed labs in epic and the pertinent results are: INR 0.9    aPTT 23    Glucose 170 Cr 0.8   MD reviewed the images obtained:  NCT head  There is no acute intracranial hemorrhage, mass effect, or edema. No acute appearing loss of gray-white differentiation. There is a chronic infarct of  the parasagittal left frontal lobe. Chronic infarct of the left basal ganglia and adjacent white matter. Chronic infarcts of the posterior insula, left occipito-temporal junction, right cerebellum, and right occipital lobe. Additional patchy hypoattenuation in the supratentorial white matter is nonspecific but probably reflects chronic microvascular ischemic changes. There is no extra-axial fluid collection. Prominence of the ventricles and sulci reflects generalized parenchymal volume loss superimposed on ex vacuo dilatation related to infarcts.  MRI brain 1. No evidence of acute intracranial abnormality. Specifically, no acute infarct. 2. 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 3. Chronic left M1 occlusion better characterized on same day CTA.  CTA head and neck  No occlusion or hemodynamically significant stenosis in  the neck. 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 Medium and small branch anterior circulation atherosclerosis.   Assessment: 75 yo female who presented as a code stroke today. At home, she was seen normal at 1400 hours, then called out to her grandson at 58 because her LLE was weak and dragging. 911 was called. She arrived not speaking or following commands. CTH without acute infarct. CTA without new occlusion, but chronic left M1 with chronic occlusion. MRI negative for new stroke.  Her stroke risk factors are a history of multiple strokes (per daughter), HLD, non adherence with DM treatment, and HTN. No tPA given due to no stroke and patient's behavior today is consistent with recrudescence of prior stroke or worsening dementia though TIA cannot be excluded. Given hx of focal seizure, and additional risk factors of dementia, and strokes will also obtain EEG to evaluate for potential epileptogenic activity. Note that after CT and MRI when patient was back in ED room, f/up exam revealed she was following  commands, weak in all extremities, and still not speaking.   Plan: - Medicine admit - HbA1c, lipid panel, TSH. - increase statin dose if LDL is over 70. - Aspirin 81mg  daily.(PTA med).  - Clopidogrel 75mg  daily(PTA med). - SBP goal - normotensive - Telemetry monitoring for arrhythmia. - bedside Swallow screen. - If passes swallow screen, can start carb modified/heart healthy diet.  - Stroke education. - PT/OT/SLP consult. - rEEG to eval for any epileptogenic discharges. - RPR, B1, B12, HIV for reversible causes of dementia screening - patient will need outpatient neurology appointment with neuropsych for cognitive testing.  - ECHO - Neurology will follow-up EEG and ECHO results, otherwise will be available as-needed going forward   Electronically signed by: , MSN, APN-BC, nurse practitioner and by MD. Note/plan to be edited by MD as needed.  Pager: [phone]

## 2021-03-12 NOTE — H&P (Signed)
History and Physical    [name] YSA:[id number] DOB: 1946-08-18 DOA: 03/12/2021  PCP: Hoy Register, MD (Confirm with patient/family/NH records and if not entered, this has to be entered at [facility] point of entry) Patient coming from: Home  I have personally briefly reviewed patient's old medical records in [facility]  Chief Complaint: AMS  HPI: [name] is a 75 y.o. female with medical history significant of multiple CVA with residual expressive aphasia, dementia, HTN, IIDM, HLD, chronic ambulation dysfunction, presented with altered mentation.  Daughter at bedside gave most histories.  Daughter lives with patient and is attending caregiver.  She was at work this afternoon around 14:00, and was called by patient's grandson over the phone who reported that the patient appeared to have one-sided weakness and could not lift her left leg/foot above the front door and appeared to stop talking.  At baseline, patient very active, walking around house and still smoking cigarettes.  Daughter reported patient occasionally has episodes of confusion and staring. In ED, reported patient mentation has now back to baseline.  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  Review of Systems: Unable to perform, patient not talking.  Past Medical History:  Diagnosis Date  . Diabetes mellitus   . H/O: CVA (cerebrovascular accident) 07/08/2014  . Hypertension   . Stroke [facility])     Past Surgical History:  Procedure Laterality Date  . NO PAST SURGERIES       reports that she has been smoking cigarettes. She has a 48.00 pack-year smoking history. She has never used smokeless tobacco. She reports that she does not drink alcohol and does not use drugs.  Allergies  Allergen Reactions  . Penicillins Itching  . Sulfa Antibiotics Other (See Comments)    Reaction not recalled, but patient was  told she was allergic    Family History  Problem Relation Age of Onset  . Hyperlipidemia Mother   . Hypertension Mother      Prior to Admission medications   Medication Sig Start Date End Date Taking? Authorizing Provider  acetaminophen (TYLENOL) 325 MG tablet Take 650 mg by mouth every 6 (six) hours as needed for pain.   Yes [provider]  aspirin 81 MG chewable tablet Chew 81 mg by mouth in the morning.   Yes [provider]  atenolol (TENORMIN) 25 MG tablet TAKE 1 TABLET (25 MG TOTAL) BY MOUTH DAILY. Patient taking differently: Take 25 mg by mouth daily. 01/01/21 01/01/22 Yes McClung, Marzella Schlein, PA-C  atorvastatin (LIPITOR) 10 MG tablet TAKE 1 TABLET (10 MG TOTAL) BY MOUTH DAILY. Patient taking differently: Take 10 mg by mouth daily. 01/01/21 01/01/22 Yes McClung, Marzella Schlein, PA-C  clopidogrel (PLAVIX) 75 MG tablet TAKE 1 TABLET (75 MG TOTAL) BY MOUTH DAILY. Patient taking differently: Take 75 mg by mouth daily. 01/01/21 01/01/22 Yes McClung, Marzella Schlein, PA-C  lisinopril (ZESTRIL) 10 MG tablet TAKE 1 TABLET (10 MG TOTAL) BY MOUTH DAILY. Patient taking differently: Take 10 mg by mouth daily. 01/01/21 01/01/22 Yes McClung, Marzella Schlein, PA-C  metFORMIN (GLUCOPHAGE) 500 MG tablet TAKE 2 TABLETS (1,000 MG TOTAL) BY MOUTH 2 (TWO) TIMES DAILY WITH A MEAL. Patient taking differently: Take 500 mg by mouth 2 (two) times daily with a meal. 01/01/21 01/01/22 Yes McClung, Marzella Schlein, PA-C  VOLTAREN 1 % GEL Apply 4 g topically 4 (four) times daily as needed (to affected areas-  for pain). 02/26/16  Yes [provider]  Blood Glucose Monitoring Suppl (ACCU-CHEK AVIVA) device Use as instructed daily. 01/09/20   Hoy Register, MD  glucose blood (TRUE METRIX BLOOD GLUCOSE TEST) test strip Use as instructed 01/09/20   Hoy Register, MD  Lancets (ACCU-CHEK MULTICLIX) lancets Use as instructed 01/09/20   Hoy Register, MD  traMADol (ULTRAM) 50 MG tablet TK 1 T PO  D PRN Patient not taking: No sig  reported 02/26/16   [provider]    Physical Exam: Vitals:   03/12/21 1500 03/12/21 1615 03/12/21 1630 03/12/21 1830  BP: (!) 177/68 (!) 147/60 (!) 139/107 (!) 149/70  Pulse: 70 82 87 82  Resp: 16 (!) 21 20 18   SpO2:  100% 99% 99%  Weight: 84 kg       Constitutional: NAD, calm, comfortable Vitals:   03/12/21 1500 03/12/21 1615 03/12/21 1630 03/12/21 1830  BP: (!) 177/68 (!) 147/60 (!) 139/107 (!) 149/70  Pulse: 70 82 87 82  Resp: 16 (!) 21 20 18   SpO2:  100% 99% 99%  Weight: 84 kg      Eyes: PERRL, lids and conjunctivae normal ENMT: Mucous membranes are moist. Posterior pharynx clear of any exudate or lesions.Normal dentition.  Neck: normal, supple, no masses, no thyromegaly Respiratory: clear to auscultation bilaterally, no wheezing, no crackles. Normal respiratory effort. No accessory muscle use.  Cardiovascular: Regular rate and rhythm, no murmurs / rubs / gallops. No extremity edema. 2+ pedal pulses. No carotid bruits.  Abdomen: no tenderness, no masses palpated. No hepatosplenomegaly. Bowel sounds positive.  Musculoskeletal: no clubbing / cyanosis. No joint deformity upper and lower extremities. Good ROM, no contractures. Normal muscle tone.  Skin: no rashes, lesions, ulcers. No induration Neurologic: No facial droop, moving limbs occasionally, unable to evaluate muscle strength, patient not following commands.   Psychiatric: Awake alert, not answering questions.     Labs on Admission: I have personally reviewed following labs and imaging studies  CBC: Recent Labs  Lab 03/12/21 1500 03/12/21 1503  WBC 10.6*  --   NEUTROABS 5.1  --   HGB 14.2 14.6  HCT 45.3 43.0  MCV 87.6  --   PLT 311  --    Basic Metabolic Panel: Recent Labs  Lab 03/12/21 1500 03/12/21 1503  NA 139 138  K 4.5 4.8  CL 107 106  CO2 22  --   GLUCOSE 168* 170*  BUN 10 12  CREATININE 1.00 0.80  CALCIUM 9.0  --    GFR: CrCl cannot be calculated (Unknown ideal weight.). Liver  Function Tests: Recent Labs  Lab 03/12/21 1500  AST 20  ALT 16  ALKPHOS 64  BILITOT 0.8  PROT 6.6  ALBUMIN 3.8   No results for input(s): LIPASE, AMYLASE in the last 168 hours. No results for input(s): AMMONIA in the last 168 hours. Coagulation Profile: Recent Labs  Lab 03/12/21 1500  INR 0.9   Cardiac Enzymes: No results for input(s): CKTOTAL, CKMB, CKMBINDEX, TROPONINI in the last 168 hours. BNP (last 3 results) No results for input(s): PROBNP in the last 8760 hours. HbA1C: No results for input(s): HGBA1C in the last 72 hours. CBG: Recent Labs  Lab 03/12/21 1453  GLUCAP 153*   Lipid Profile: No results for input(s): CHOL, HDL, LDLCALC, TRIG, CHOLHDL, LDLDIRECT in the last 72 hours. Thyroid Function Tests: No results for input(s): TSH, T4TOTAL, FREET4, T3FREE, THYROIDAB in the last 72 hours. Anemia Panel: No results for input(s): VITAMINB12, FOLATE, FERRITIN, TIBC, IRON,  RETICCTPCT in the last 72 hours. Urine analysis:    Component Value Date/Time   COLORURINE YELLOW 09/13/2015 1455   APPEARANCEUR CLEAR 09/13/2015 1455   LABSPEC 1.014 09/13/2015 1455   PHURINE 5.5 09/13/2015 1455   GLUCOSEU NEGATIVE 09/13/2015 1455   HGBUR NEGATIVE 09/13/2015 1455   BILIRUBINUR NEGATIVE 09/13/2015 1455   KETONESUR NEGATIVE 09/13/2015 1455   PROTEINUR NEGATIVE 09/13/2015 1455   UROBILINOGEN 1.0 09/13/2015 1455   NITRITE NEGATIVE 09/13/2015 1455   LEUKOCYTESUR NEGATIVE 09/13/2015 1455    Radiological Exams on Admission: MR BRAIN WO CONTRAST  Result Date: 03/12/2021 CLINICAL DATA:  Neuro deficit, acute stroke suspected. EXAM: MRI HEAD WITHOUT CONTRAST TECHNIQUE: Multiplanar, multiecho pulse sequences of the brain and surrounding structures were obtained without intravenous contrast. COMPARISON:  MRI July 08, 2014. FINDINGS: Brain: No acute infarction, hemorrhage, hydrocephalus, extra-axial collection or mass lesion. Similar large left frontal lobe infarct with associated  encephalomalacia. Similar additional infarcts in the right occipital lobe, left parietal lobe, right cerebellum, and bilateral basal ganglia. Additional confluent and patchy T2/FLAIR hyperintensities within the white matter are compatible with chronic microvascular ischemic disease. Advanced generalized atrophy with ex vacuo ventricular dilation, similar to prior. Similar evidence of superficial siderosis with susceptibility along the cerebral convexities. Vascular: Absent left M1 MCA flow void, compatible with known occlusion better characterized on same day CTA. Skull and upper cervical spine: Normal marrow signal. Sinuses/Orbits: Clear sinuses.  Unremarkable orbits. Other: No mastoid effusions. IMPRESSION: 1. No evidence of acute intracranial abnormality. Specifically, no acute infarct. 2. 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 3. Chronic left M1 occlusion better characterized on same day CTA. Electronically Signed   By: Feliberto HartsFrederick S Jones MD   On: 03/12/2021 17:14   CT HEAD CODE STROKE WO CONTRAST  Result Date: 03/12/2021 CLINICAL DATA:  Code stroke.  Altered mental status, aphasia EXAM: CT HEAD WITHOUT CONTRAST CT ANGIOGRAPHY OF THE HEAD AND NECK TECHNIQUE: Contiguous axial images were obtained from the base of the skull through the vertex without intravenous contrast. Multidetector CT imaging of the head and neck was performed using the standard protocol during bolus administration of intravenous contrast. Multiplanar CT image reconstructions and MIPs were obtained to evaluate the vascular anatomy. Carotid stenosis measurements (when applicable) are obtained utilizing NASCET criteria, using the distal internal carotid diameter as the denominator. CONTRAST:  50mL OMNIPAQUE IOHEXOL 350 MG/ML SOLN COMPARISON:  None. FINDINGS: CT HEAD Brain: There is no acute intracranial hemorrhage, mass effect, or edema. No acute appearing loss of gray-white differentiation.  There is a chronic infarct of the parasagittal left frontal lobe. Chronic infarct of the left basal ganglia and adjacent white matter. Chronic infarcts of the posterior insula, left occipito-temporal junction, right cerebellum, and right occipital lobe. Additional patchy hypoattenuation in the supratentorial white matter is nonspecific but probably reflects chronic microvascular ischemic changes. There is no extra-axial fluid collection. Prominence of the ventricles and sulci reflects generalized parenchymal volume loss superimposed on ex vacuo dilatation related to infarcts. Vascular: No hyperdense vessel. Skull: Calvarium is unremarkable. Sinuses/Orbits: No acute finding. Other: None. Review of the MIP images confirms the above findings CTA NECK Aortic arch: Calcified and irregular noncalcified plaque along the arch and at the patent great vessel origins. Plaque is greatest at the left subclavian origin with less than 50% stenosis. Right carotid system: Patent. Eccentric noncalcified plaque at the ICA origin causing less than 50% stenosis. Retropharyngeal course of the ICA. Left carotid system: Patent. No stenosis at the ICA origin. Relatively  smaller caliber of the left cervical ICA likely due to intracranial findings. Vertebral arteries: Patent and codominant. Skeleton: Degenerative changes of the cervical spine primarily at C5-C6. Other neck: Negative. Upper chest: Included lungs are clear. Review of the MIP images confirms the above findings CTA HEAD Anterior circulation: Intracranial internal carotid arteries are patent with calcified plaque causing mild stenosis. Anterior cerebral arteries are patent. Anterior communicating artery is present. Distal ACA branch atherosclerotic irregularity. Right middle cerebral artery is patent. Atherosclerotic irregularity of right M2 and more distal branches with areas of moderate stenosis identified. There is likely chronic occlusion of the left middle cerebral artery  with collateral formation. Posterior circulation: Intracranial vertebral arteries are patent. Basilar artery is patent. Major cerebellar artery origins are patent. Posterior cerebral arteries are patent. Venous sinuses: Patent as allowed by contrast bolus timing. Review of the MIP images confirms the above findings IMPRESSION: There is no acute intracranial hemorrhage or evidence of acute infarction. Multiple chronic infarcts. No occlusion or hemodynamically significant stenosis in the neck. 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 Medium and small branch anterior circulation atherosclerosis. Initial results were communicated to Dr. Iver Nestle at 3:22 pm on 03/12/2021 by text page via the [facility] messaging system. Electronically Signed   By: Guadlupe Spanish M.D.   On: 03/12/2021 15:32   CT ANGIO HEAD CODE STROKE  Result Date: 03/12/2021 CLINICAL DATA:  Code stroke.  Altered mental status, aphasia EXAM: CT HEAD WITHOUT CONTRAST CT ANGIOGRAPHY OF THE HEAD AND NECK TECHNIQUE: Contiguous axial images were obtained from the base of the skull through the vertex without intravenous contrast. Multidetector CT imaging of the head and neck was performed using the standard protocol during bolus administration of intravenous contrast. Multiplanar CT image reconstructions and MIPs were obtained to evaluate the vascular anatomy. Carotid stenosis measurements (when applicable) are obtained utilizing NASCET criteria, using the distal internal carotid diameter as the denominator. CONTRAST:  19mL OMNIPAQUE IOHEXOL 350 MG/ML SOLN COMPARISON:  None. FINDINGS: CT HEAD Brain: There is no acute intracranial hemorrhage, mass effect, or edema. No acute appearing loss of gray-white differentiation. There is a chronic infarct of the parasagittal left frontal lobe. Chronic infarct of the left basal ganglia and adjacent white matter. Chronic infarcts of the posterior insula, left occipito-temporal junction, right cerebellum, and  right occipital lobe. Additional patchy hypoattenuation in the supratentorial white matter is nonspecific but probably reflects chronic microvascular ischemic changes. There is no extra-axial fluid collection. Prominence of the ventricles and sulci reflects generalized parenchymal volume loss superimposed on ex vacuo dilatation related to infarcts. Vascular: No hyperdense vessel. Skull: Calvarium is unremarkable. Sinuses/Orbits: No acute finding. Other: None. Review of the MIP images confirms the above findings CTA NECK Aortic arch: Calcified and irregular noncalcified plaque along the arch and at the patent great vessel origins. Plaque is greatest at the left subclavian origin with less than 50% stenosis. Right carotid system: Patent. Eccentric noncalcified plaque at the ICA origin causing less than 50% stenosis. Retropharyngeal course of the ICA. Left carotid system: Patent. No stenosis at the ICA origin. Relatively smaller caliber of the left cervical ICA likely due to intracranial findings. Vertebral arteries: Patent and codominant. Skeleton: Degenerative changes of the cervical spine primarily at C5-C6. Other neck: Negative. Upper chest: Included lungs are clear. Review of the MIP images confirms the above findings CTA HEAD Anterior circulation: Intracranial internal carotid arteries are patent with calcified plaque causing mild stenosis. Anterior cerebral arteries are patent. Anterior communicating artery is present. Distal ACA  branch atherosclerotic irregularity. Right middle cerebral artery is patent. Atherosclerotic irregularity of right M2 and more distal branches with areas of moderate stenosis identified. There is likely chronic occlusion of the left middle cerebral artery with collateral formation. Posterior circulation: Intracranial vertebral arteries are patent. Basilar artery is patent. Major cerebellar artery origins are patent. Posterior cerebral arteries are patent. Venous sinuses: Patent as  allowed by contrast bolus timing. Review of the MIP images confirms the above findings IMPRESSION: There is no acute intracranial hemorrhage or evidence of acute infarction. Multiple chronic infarcts. No occlusion or hemodynamically significant stenosis in the neck. 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 Medium and small branch anterior circulation atherosclerosis. Initial results were communicated to Dr. Iver Nestle at 3:22 pm on 03/12/2021 by text page via the [facility] messaging system. Electronically Signed   By: Guadlupe Spanish M.D.   On: 03/12/2021 15:32   CT ANGIO NECK CODE STROKE  Result Date: 03/12/2021 CLINICAL DATA:  Code stroke.  Altered mental status, aphasia EXAM: CT HEAD WITHOUT CONTRAST CT ANGIOGRAPHY OF THE HEAD AND NECK TECHNIQUE: Contiguous axial images were obtained from the base of the skull through the vertex without intravenous contrast. Multidetector CT imaging of the head and neck was performed using the standard protocol during bolus administration of intravenous contrast. Multiplanar CT image reconstructions and MIPs were obtained to evaluate the vascular anatomy. Carotid stenosis measurements (when applicable) are obtained utilizing NASCET criteria, using the distal internal carotid diameter as the denominator. CONTRAST:  50mL OMNIPAQUE IOHEXOL 350 MG/ML SOLN COMPARISON:  None. FINDINGS: CT HEAD Brain: There is no acute intracranial hemorrhage, mass effect, or edema. No acute appearing loss of gray-white differentiation. There is a chronic infarct of the parasagittal left frontal lobe. Chronic infarct of the left basal ganglia and adjacent white matter. Chronic infarcts of the posterior insula, left occipito-temporal junction, right cerebellum, and right occipital lobe. Additional patchy hypoattenuation in the supratentorial white matter is nonspecific but probably reflects chronic microvascular ischemic changes. There is no extra-axial fluid collection. Prominence of the  ventricles and sulci reflects generalized parenchymal volume loss superimposed on ex vacuo dilatation related to infarcts. Vascular: No hyperdense vessel. Skull: Calvarium is unremarkable. Sinuses/Orbits: No acute finding. Other: None. Review of the MIP images confirms the above findings CTA NECK Aortic arch: Calcified and irregular noncalcified plaque along the arch and at the patent great vessel origins. Plaque is greatest at the left subclavian origin with less than 50% stenosis. Right carotid system: Patent. Eccentric noncalcified plaque at the ICA origin causing less than 50% stenosis. Retropharyngeal course of the ICA. Left carotid system: Patent. No stenosis at the ICA origin. Relatively smaller caliber of the left cervical ICA likely due to intracranial findings. Vertebral arteries: Patent and codominant. Skeleton: Degenerative changes of the cervical spine primarily at C5-C6. Other neck: Negative. Upper chest: Included lungs are clear. Review of the MIP images confirms the above findings CTA HEAD Anterior circulation: Intracranial internal carotid arteries are patent with calcified plaque causing mild stenosis. Anterior cerebral arteries are patent. Anterior communicating artery is present. Distal ACA branch atherosclerotic irregularity. Right middle cerebral artery is patent. Atherosclerotic irregularity of right M2 and more distal branches with areas of moderate stenosis identified. There is likely chronic occlusion of the left middle cerebral artery with collateral formation. Posterior circulation: Intracranial vertebral arteries are patent. Basilar artery is patent. Major cerebellar artery origins are patent. Posterior cerebral arteries are patent. Venous sinuses: Patent as allowed by contrast bolus timing. Review of the MIP  images confirms the above findings IMPRESSION: There is no acute intracranial hemorrhage or evidence of acute infarction. Multiple chronic infarcts. No occlusion or hemodynamically  significant stenosis in the neck. 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 Medium and small branch anterior circulation atherosclerosis. Initial results were communicated to Dr. Iver Nestle at 3:22 pm on 03/12/2021 by text page via the [facility] messaging system. Electronically Signed   By: Guadlupe Spanish M.D.   On: 03/12/2021 15:32    EKG: Independently reviewed. Sinus, similar QRS as before.  Assessment/Plan Active Problems:   AMS (altered mental status)  (please populate well all problems here in Problem List. (For example, if patient is on BP meds at home and you resume or decide to hold them, it is a problem that needs to be her. Same for CAD, COPD, HLD and so on)  Worsening of expressive aphasia -Stroke ruled out. Neurology ordered EEG. -Further stroke workup with Echo and A1C and lipid panel. -Speech evaluation. -Allow permissive HTN for 48 hours, hold home BP meds. -Continue ASA and Plavix regimen  Question of seizure -No Hx of seizure. -EEG pending. -Precautions.  Altered mentation -Seizure workup, vs worsening of baseline dementia. Avoid sedation medications. -UA pending, RPR, B12, TSH sent.  IIDM -Hold Metformin for 72 hours -Sliding scale for now.  HTN -PRN Labetalol  Baseline dementia -Neurology follows  Cigarette smoking -Nicotine patch.  DVT prophylaxis: Lovenox Code Status: Full code Family Communication: Daughter at bedside Disposition Plan: Expect 1-2 days hospital stay, pending on PT evaluation. Consults called: Neurology Admission status: Tele admit   Emeline General MD [facility] Pager [phone]  03/12/2021, 6:56 PM

## 2021-03-13 ENCOUNTER — Inpatient Hospital Stay (HOSPITAL_COMMUNITY): Payer: Medicare Other

## 2021-03-13 ENCOUNTER — Encounter (HOSPITAL_COMMUNITY): Payer: Self-pay | Admitting: Internal Medicine

## 2021-03-13 DIAGNOSIS — G459 Transient cerebral ischemic attack, unspecified: Secondary | ICD-10-CM

## 2021-03-13 DIAGNOSIS — R4182 Altered mental status, unspecified: Secondary | ICD-10-CM

## 2021-03-13 DIAGNOSIS — R569 Unspecified convulsions: Secondary | ICD-10-CM

## 2021-03-13 DIAGNOSIS — E119 Type 2 diabetes mellitus without complications: Secondary | ICD-10-CM

## 2021-03-13 LAB — ECHOCARDIOGRAM COMPLETE
AR max vel: 2.37 cm2
AV Area VTI: 2.72 cm2
AV Area mean vel: 2.09 cm2
AV Mean grad: 3 mmHg
AV Peak grad: 6.6 mmHg
Ao pk vel: 1.28 m/s
Area-P 1/2: 3.85 cm2
Calc EF: 49.2 %
S' Lateral: 3.2 cm
Single Plane A2C EF: 49.2 %
Single Plane A4C EF: 50.6 %
Weight: 2962.98 oz

## 2021-03-13 LAB — VITAMIN B12: Vitamin B-12: 69 pg/mL — ABNORMAL LOW (ref 180–914)

## 2021-03-13 LAB — CBG MONITORING, ED
Glucose-Capillary: 156 mg/dL — ABNORMAL HIGH (ref 70–99)
Glucose-Capillary: 221 mg/dL — ABNORMAL HIGH (ref 70–99)

## 2021-03-13 LAB — CBC WITH DIFFERENTIAL/PLATELET
Abs Immature Granulocytes: 0.03 10*3/uL (ref 0.00–0.07)
Basophils Absolute: 0 10*3/uL (ref 0.0–0.1)
Basophils Relative: 1 %
Eosinophils Absolute: 0.1 10*3/uL (ref 0.0–0.5)
Eosinophils Relative: 1 %
HCT: 42.3 % (ref 36.0–46.0)
Hemoglobin: 12.8 g/dL (ref 12.0–15.0)
Immature Granulocytes: 0 %
Lymphocytes Relative: 32 %
Lymphs Abs: 2.8 10*3/uL (ref 0.7–4.0)
MCH: 27.2 pg (ref 26.0–34.0)
MCHC: 30.3 g/dL (ref 30.0–36.0)
MCV: 89.8 fL (ref 80.0–100.0)
Monocytes Absolute: 0.5 10*3/uL (ref 0.1–1.0)
Monocytes Relative: 5 %
Neutro Abs: 5.2 10*3/uL (ref 1.7–7.7)
Neutrophils Relative %: 61 %
Platelets: 271 10*3/uL (ref 150–400)
RBC: 4.71 MIL/uL (ref 3.87–5.11)
RDW: 14.4 % (ref 11.5–15.5)
WBC: 8.5 10*3/uL (ref 4.0–10.5)
nRBC: 0 % (ref 0.0–0.2)

## 2021-03-13 LAB — GLUCOSE, CAPILLARY
Glucose-Capillary: 75 mg/dL (ref 70–99)
Glucose-Capillary: 133 mg/dL — ABNORMAL HIGH (ref 70–99)

## 2021-03-13 LAB — HEMOGLOBIN A1C
Hgb A1c MFr Bld: 8.4 % — ABNORMAL HIGH (ref 4.8–5.6)
Mean Plasma Glucose: 194.38 mg/dL

## 2021-03-13 LAB — BASIC METABOLIC PANEL
Anion gap: 6 (ref 5–15)
BUN: 11 mg/dL (ref 8–23)
CO2: 25 mmol/L (ref 22–32)
Calcium: 8.5 mg/dL — ABNORMAL LOW (ref 8.9–10.3)
Chloride: 109 mmol/L (ref 98–111)
Creatinine, Ser: 0.95 mg/dL (ref 0.44–1.00)
GFR, Estimated: >60 mL/min (ref >60)
Glucose, Bld: 155 mg/dL — ABNORMAL HIGH (ref 70–99)
Potassium: 4.1 mmol/L (ref 3.5–5.1)
Sodium: 140 mmol/L (ref 135–145)

## 2021-03-13 LAB — LIPID PANEL
Cholesterol: 132 mg/dL (ref 0–200)
HDL: 41 mg/dL (ref >40)
LDL Cholesterol: 70 mg/dL (ref 0–99)
Total CHOL/HDL Ratio: 3.2 RATIO
Triglycerides: 105 mg/dL (ref <150)
VLDL: 21 mg/dL (ref 0–40)

## 2021-03-13 LAB — URINALYSIS, ROUTINE W REFLEX MICROSCOPIC
Bilirubin Urine: NEGATIVE
Glucose, UA: NEGATIVE mg/dL
Hgb urine dipstick: NEGATIVE
Ketones, ur: NEGATIVE mg/dL
Leukocytes,Ua: NEGATIVE
Nitrite: NEGATIVE
Protein, ur: NEGATIVE mg/dL
Specific Gravity, Urine: 1.031 — ABNORMAL HIGH (ref 1.005–1.030)
pH: 5 (ref 5.0–8.0)

## 2021-03-13 LAB — RPR: RPR Ser Ql: NONREACTIVE

## 2021-03-13 LAB — MAGNESIUM: Magnesium: 1.8 mg/dL (ref 1.7–2.4)

## 2021-03-13 MED ORDER — CYANOCOBALAMIN 1000 MCG/ML IJ SOLN
1000.0000 ug | Freq: Every day | INTRAMUSCULAR | Status: DC
  Administered 2021-03-13 – 2021-03-14 (×2): 1000 ug via INTRAMUSCULAR
  Filled 2021-03-13 (×2): qty 1

## 2021-03-13 MED ORDER — MAGNESIUM SULFATE 2 GM/50ML IV SOLN
2.0000 g | Freq: Once | INTRAVENOUS | Status: AC
  Administered 2021-03-13: 2 g via INTRAVENOUS
  Filled 2021-03-13: qty 50

## 2021-03-13 MED ORDER — VITAMIN B-12 1000 MCG PO TABS: 1000.0000 ug | ORAL_TABLET | Freq: Every day | ORAL | Status: DC

## 2021-03-13 MED ORDER — THIAMINE HCL 100 MG PO TABS
100.0000 mg | ORAL_TABLET | Freq: Every day | ORAL | Status: DC
  Administered 2021-03-14: 100 mg via ORAL
  Filled 2021-03-13 (×2): qty 1

## 2021-03-13 MED ORDER — VITAMIN B-12 1000 MCG PO TABS
1000.0000 ug | ORAL_TABLET | Freq: Every day | ORAL | Status: DC
  Administered 2021-03-13: 1000 ug via ORAL
  Filled 2021-03-13: qty 1

## 2021-03-13 MED ORDER — LEVETIRACETAM ER 500 MG PO TB24
500.0000 mg | ORAL_TABLET | Freq: Every day | ORAL | Status: DC
  Administered 2021-03-13: 500 mg via ORAL
  Filled 2021-03-13 (×2): qty 1
  Filled 2021-03-13: qty 2

## 2021-03-13 MED ORDER — THIAMINE HCL 100 MG/ML IJ SOLN
500.0000 mg | Freq: Once | INTRAVENOUS | Status: AC
  Administered 2021-03-13: 500 mg via INTRAVENOUS
  Filled 2021-03-13: qty 5

## 2021-03-13 NOTE — Plan of Care (Signed)
  Problem: Education: Goal: Knowledge of General Education information will improve Description Including pain rating scale, medication(s)/side effects and non-pharmacologic comfort measures Outcome: Progressing   Problem: Health Behavior/Discharge Planning: Goal: Ability to manage health-related needs will improve Outcome: Progressing   Problem: Clinical Measurements: Goal: Ability to maintain clinical measurements within normal limits will improve Outcome: Progressing Goal: Will remain free from infection Outcome: Progressing Goal: Diagnostic test results will improve Outcome: Progressing Goal: Respiratory complications will improve Outcome: Progressing Goal: Cardiovascular complication will be avoided Outcome: Progressing   Problem: Activity: Goal: Risk for activity intolerance will decrease Outcome: Progressing   Problem: Nutrition: Goal: Adequate nutrition will be maintained Outcome: Progressing   Problem: Coping: Goal: Level of anxiety will decrease Outcome: Progressing   Problem: Elimination: Goal: Will not experience complications related to bowel motility Outcome: Progressing Goal: Will not experience complications related to urinary retention Outcome: Progressing   Problem: Pain Managment: Goal: General experience of comfort will improve Outcome: Progressing   Problem: Safety: Goal: Ability to remain free from injury will improve Outcome: Progressing   Problem: Skin Integrity: Goal: Risk for impaired skin integrity will decrease Outcome: Progressing   Problem: Education: Goal: Knowledge of disease or condition will improve Outcome: Progressing Goal: Knowledge of secondary prevention will improve Outcome: Progressing Goal: Knowledge of patient specific risk factors addressed and post discharge goals established will improve Outcome: Progressing Goal: Individualized Educational Video(s) Outcome: Progressing   Problem: Coping: Goal: Will verbalize  positive feelings about self Outcome: Progressing Goal: Will identify appropriate support needs Outcome: Progressing   Problem: Health Behavior/Discharge Planning: Goal: Ability to manage health-related needs will improve Outcome: Progressing   

## 2021-03-13 NOTE — Procedures (Signed)
Patient Name: [name]  MRN: [id number]  Epilepsy Attending: Charlsie Quest  Referring Physician/Provider: Dr. [name], NP Date: 03/12/2021 Duration: 24.23 mins  Patient history: 75 year old female who presented with trouble speaking and ongoing commands.  EEG to evaluate for seizures.  Level of alertness: Awake  AEDs during EEG study: None  Technical aspects: This EEG study was done with scalp electrodes positioned according to the 10-20 International system of electrode placement. Electrical activity was acquired at a sampling rate of 500Hz  and reviewed with a high frequency filter of 70Hz  and a low frequency filter of 1Hz . EEG data were recorded continuously and digitally stored.   Description: The posterior dominant rhythm consists of 8-9 Hz activity of moderate voltage (25-35 uV) seen predominantly in posterior head regions, symmetric and reactive to eye opening and eye closing.  EEG showed sharp waves and 2 to 3 Hz delta slowing in left fronto- temporal region.  Hyperventilation and photic stimulation were not performed.     ABNORMALITY -Sharp wave, left fronto- temporal region -Intermittent slow, left fronto- temporal region  IMPRESSION: This study showed evidence of epileptogenicity as well as cortical dysfunction in left frontotemporal region likely secondary to underlying stroke.  No seizures or epileptiform discharges were seen throughout the recording.  [name] 

## 2021-03-13 NOTE — Progress Notes (Addendum)
Neurology Progress Note Subjective: EEG without seizures or epileptiform discharges but with evidence of epileptogenicity and cortical dysfunction in the left frontotemporal region- likely secondary to underlying stroke Daughter at patient's bedside, reports that patient is back to baseline mental and functional status Discussed AED with patient and patient's daughter who are agreeable with AED therapy Discussed smoking cessation with patient who verbalized readiness to stop smoking  Exam: Vitals:   03/13/21 1404 03/13/21 1602  BP: (!) 147/55 (!) 141/68  Pulse: 83 92  Resp: 18 18  Temp: 98.9 F (37.2 C) 98.3 F (36.8 C)  SpO2: 100% 98%   Gen: In bed, awake, in no acute distress Resp: non-labored breathing, no respiratory distress Abd: soft, non-tender, non-distended  Neuro: Mental Status: Awake, alert to self and location. When asked the year and month patient reads off of information board in room. She is able to state that she is in the hospital because "she had a stroke" and recall some events leading to hospitalization. Speech is dysarthric and mildly aphasic. She is edentulous at baseline possibly contributing to her dysarthric speech. No neglect is noted on examination.  CN: Pupils are 2 mm, equal, round, and briskly reactive to light, visual fields are full, EOMI, sensation to face is decreased on the left (described as slick on the left, tingling on the right), face appears symmetric, hearing is intact to voice, shoulder shrug symmetric, symmetric palate rise, tongue protrudes midline Motor: Moves all extremities against gravity with subtle left upper and lower extremity weakness compared to the right (baseline waxing/waning of left hemibody weakness per daughter at bedside). Right hemibody 5/5, left hemibody 4/5 without vertical drift on assessment.  Tone and bulk are normal.  Sensory: Right hemibody described as "tingling" left hemibody described as "slick" throughout. Unable to  illicit further description of sensation to light touch.  DTR: 3+ biceps and brachioradialis, 2+ and symmetric patellae  Pertinent Labs: CBC    Component Value Date/Time   WBC 8.5 03/13/2021 0756   RBC 4.71 03/13/2021 0756   HGB 12.8 03/13/2021 0756   HGB 13.9 01/01/2021 1647   HCT 42.3 03/13/2021 0756   HCT 42.2 01/01/2021 1647   PLT 271 03/13/2021 0756   PLT 331 01/01/2021 1647   MCV 89.8 03/13/2021 0756   MCV 84 01/01/2021 1647   MCH 27.2 03/13/2021 0756   MCHC 30.3 03/13/2021 0756   RDW 14.4 03/13/2021 0756   RDW 14.1 01/01/2021 1647   LYMPHSABS 2.8 03/13/2021 0756   LYMPHSABS 3.1 01/01/2021 1647   MONOABS 0.5 03/13/2021 0756   EOSABS 0.1 03/13/2021 0756   EOSABS 0.1 01/01/2021 1647   BASOSABS 0.0 03/13/2021 0756   BASOSABS 0.1 01/01/2021 1647   CMP     Component Value Date/Time   NA 140 03/13/2021 0756   NA 142 01/01/2021 1647   K 4.1 03/13/2021 0756   CL 109 03/13/2021 0756   CO2 25 03/13/2021 0756   GLUCOSE 155 (H) 03/13/2021 0756   BUN 11 03/13/2021 0756   BUN 9 01/01/2021 1647   CREATININE 0.95 03/13/2021 0756   CALCIUM 8.5 (L) 03/13/2021 0756   PROT 6.6 03/12/2021 1500   PROT 6.6 01/01/2021 1647   ALBUMIN 3.8 03/12/2021 1500   ALBUMIN 4.2 01/01/2021 1647   AST 20 03/12/2021 1500   ALT 16 03/12/2021 1500   ALKPHOS 64 03/12/2021 1500   BILITOT 0.8 03/12/2021 1500   BILITOT 0.3 01/01/2021 1647   GFRNONAA >60 03/13/2021 0756   GFRAA 65 01/01/2021 1647  Lab Results  Component Value Date   CHOL 132 03/13/2021   HDL 41 03/13/2021   LDLCALC 70 03/13/2021   TRIG 105 03/13/2021   CHOLHDL 3.2 03/13/2021   Lab Results  Component Value Date   HGBA1C 8.4 (H) 03/13/2021   - RPR neg, B1 pending, B12 (69), HIV for reversible causes of dementia screening (-)  MD reviewed the images obtained:  NCT head  There is no acute intracranial hemorrhage, mass effect, or edema. No acute appearing loss of gray-white differentiation. There is a chronic infarct of  the parasagittal left frontal lobe. Chronic infarct of the left basal ganglia and adjacent white matter. Chronic infarcts of the posterior insula, left occipito-temporal junction, right cerebellum, and right occipital lobe. Additional patchy hypoattenuation in the supratentorial white matter is nonspecific but probably reflects chronic microvascular ischemic changes. There is no extra-axial fluid collection. Prominence of the ventricles and sulci reflects generalized parenchymal volume loss superimposed on ex vacuo dilatation related to infarcts.  MRI brain 1. No evidence of acute intracranial abnormality. Specifically, no acute infarct. 2. 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 3. Chronic left M1 occlusion better characterized on same day CTA.  CTA head and neck  No occlusion or hemodynamically significant stenosis in the neck. 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 Medium and small branch anterior circulation atherosclerosis.   EEG: "IMPRESSION: This study showed evidence of epileptogenicity as well as cortical dysfunction in left frontotemporal region likely secondary to underlying stroke.  No seizures or epileptiform discharges were seen throughout the recording."  Echocardiogram: " IMPRESSIONS  1. Left ventricular ejection fraction, by estimation, is 60 to 65%. The  left ventricle has normal function. The left ventricle has no regional  wall motion abnormalities. Left ventricular diastolic parameters are  consistent with Grade I diastolic  dysfunction (impaired relaxation).  2. Right ventricular systolic function is normal. The right ventricular  size is normal.  3. The mitral valve is abnormal. Trivial mitral valve regurgitation. No  evidence of mitral stenosis.  4. The aortic valve is normal in structure. Aortic valve regurgitation is  not visualized. No aortic stenosis is present.  5. The inferior vena cava is  normal in size with greater than 50%  respiratory variability, suggesting right atrial pressure of 3 mmHg."  Assessment: 75 yo female who presented as a code stroke 4/21 due to acutely becoming nonverbal, LLE dragging at home, and not following commands.  - On reevaluation 4/22 patient returned to baseline mental status without residual presenting signs / symptoms.  - EEG obtained with evidence of epileptogenicity making seizure a likely etiology for presentation and recrudescence of prior stroke. Given focal neurological deficit and these findings, patient meets criteria for epilepsy and we will initiate anti-seizure medication - Also, B12 deficiency may be contributing to her susceptibility to delirium    Recommendations:  # Focal epilepsy  - Keppra ER 500 mg qhs initiated - SBP goal normotension - Outpatient neurology follow up for AED management in 4 - 8 weeks after discharge (referral placed) - If pharmacologic measures are considered for smoking cessation; do not recommend Wellbutrin as this lowers the seizure threshold. Nicotine patch / gum as needed for smoking cessation.   # History of CVA - Continue ASA 81 mg daily, clopidogrel 75 mg daily  # B12 Deficiency  - B12 IM 1000 mcg x 7 days, then 1000 mcg daily, goal level > 400  - empiric thiamine 500 mg once given low B12,  level pending and to be followed up by PCP - Neurology will be available on an as needed basis going forward, please call with any new questions  Standard seizure precautions, please include in discharge instructions: Per [facility] statutes, patients with seizures are not allowed to drive until  they have been seizure-free for six months. Use caution when using heavy equipment or power tools. Avoid working on ladders or at heights. Take showers instead of baths. Ensure the water temperature is not too high on the home water heater. Do not go swimming alone. When caring for infants or small children, sit down  when holding, feeding, or changing them to minimize risk of injury to the child in the event you have a seizure.  To reduce risk of seizures, maintain good sleep hygiene avoid alcohol and illicit drug use, take all anti-seizure medications as prescribed.    Lanae Boast, AGACNP-BC [facility] [phone]  Attending Neurologist's note:  I personally saw this patient, gathering history, performing a full neurologic examination, reviewing relevant labs, personally reviewing relevant imaging including MRI brain and formulated the assessment and plan, adding the note above for completeness and clarity to accurately reflect my thoughts   Brooke Dare MD-PhD [facility] [phone]

## 2021-03-13 NOTE — ED Notes (Signed)
ECHO complete and patient via stretcher to [address] with all belongings and with family. NT transport. VSS

## 2021-03-13 NOTE — Progress Notes (Signed)
PROGRESS NOTE    [name]  ZOX:[id number] DOB: [birth year] DOA: 03/12/2021 PCP: Hoy Register, MD    Chief Complaint  Patient presents with  . Code Stroke    Brief Narrative:  [name] is a 75 y.o. female with medical history significant of multiple CVA with residual expressive aphasia, dementia, HTN, IIDM, HLD, chronic ambulation dysfunction, presented with altered mentation. 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 She is improving, getting EEG, neurology following   Subjective:  Fully alert, mental status appears back to baseline, however , she is still very weak, family at bedside   Assessment & Plan:   Active Problems:   AMS (altered mental status)   Acute worsening of mental status, worsening of expressive aphasia, questionable seizure -Stroke ruled out, EEG "showed evidence of epileptogenicity as well as cortical dysfunction in left frontotemporal region likely secondary to underlying stroke.  No seizures or epileptiform discharges were seen throughout the recording." -Case discussed with neurology over the phone who plan to start AEDs, will follow neurology recommendation -Continue seizure precaution  B12 deficiency: Start B12 supplement  Non-insulin-dependent type 2 diabetes, uncontrolled with hyperglycemia A1c 8.4 -On metformin at home -Currently on SSI in hospital  Hypertension: Stable on home regimen  Prior CVA: Continue aspirin/Plavix/Lipitor  Baseline dementia:, She is pleasant and  Interactive, she follows commands, with baseline dysarthria, it is hard to fully appreciate severity of dementia, though does not appear severe  Cigarette smoking Cessation education provided, Nicotine patch  Nutritional Assessment: The patient's BMI is: Body mass index is 32.8 kg/m.Marland Kitchen      Unresulted Labs (From admission, onward)          Start      Ordered   03/12/21 1718  Vitamin B1  ONCE - STAT,   STAT        03/12/21 1718            DVT prophylaxis: enoxaparin (LOVENOX) injection 40 mg Start: 03/12/21 2200   Code Status: Full Family Communication: family at bedside  Disposition:   Status is: Inpatient  Dispo: The patient is from: Home              Anticipated d/c is to: Home, likely will need home health, family reports she is weaker than normal, reports she has 24 x 7 care at home              Anticipated d/c date is: Possible 4/23, after PT eval, need neurology clearance                Consultants:   neurology  Procedures:   EEG  Antimicrobials:   None     Objective: Vitals:   03/13/21 1230 03/13/21 1330 03/13/21 1404 03/13/21 1602  BP:   (!) 147/55 (!) 141/68  Pulse: 80 84 83 92  Resp: Temp:   98.9 F (37.2 C) 98.3 F (36.8 C)  TempSrc:   Oral Oral  SpO2: 98% 100% 100% 98%  Weight:        Intake/Output Summary (Last 24 hours) at 03/13/2021 1632 Last data filed at 03/13/2021 0700 Gross per 24 hour  Intake --  Output 700 ml  Net -700 ml   Filed Weights   03/12/21 1500  Weight: 84 kg    Examination:  General exam: calm, NAD Respiratory system: Clear to auscultation. Respiratory effort normal. Cardiovascular system:  S1 & S2 heard, RRR. No JVD, no murmur, No pedal edema. Gastrointestinal system: Abdomen is nondistended, soft and nontender.  Normal bowel sounds heard. Central nervous system: Alert and pleasant, she has baseline aphasia, left side appear weaker than right side.  Family report this is baseline. Extremities: No edema,  left side appear weaker than right side.  Family report this is baseline. Skin: No rashes, lesions or ulcers Psychiatry: Pleasant, mood & affect appropriate.     Data Reviewed: I have personally reviewed following labs and imaging studies  CBC: Recent Labs  Lab 03/12/21 1500 03/12/21 1503 03/13/21 0756  WBC 10.6*  --  8.5  NEUTROABS 5.1   --  5.2  HGB 14.2 14.6 12.8  HCT 45.3 43.0 42.3  MCV 87.6  --  89.8  PLT 311  --  271    Basic Metabolic Panel: Recent Labs  Lab 03/12/21 1500 03/12/21 1503 03/13/21 0756  NA 139 138 140  K 4.5 4.8 4.1  CL 107 106 109  CO2 22  --  25  GLUCOSE 168* 170* 155*  BUN 10 12 11   CREATININE 1.00 0.80 0.95  CALCIUM 9.0  --  8.5*  MG  --   --  1.8    GFR: CrCl cannot be calculated (Unknown ideal weight.).  Liver Function Tests: Recent Labs  Lab 03/12/21 1500  AST 20  ALT 16  ALKPHOS 64  BILITOT 0.8  PROT 6.6  ALBUMIN 3.8    CBG: Recent Labs  Lab 03/12/21 1453 03/13/21 0758 03/13/21 1227 03/13/21 1615  GLUCAP 153* 156* 221* 75     Recent Results (from the past 240 hour(s))  Resp Panel by RT-PCR (Flu A&B, Covid) Nasopharyngeal Swab     Status: None   Collection Time: 03/12/21  4:51 PM   Specimen: Nasopharyngeal Swab; Nasopharyngeal(NP) swabs in vial transport medium  Result Value Ref Range Status   SARS Coronavirus 2 by RT PCR NEGATIVE NEGATIVE Final    Comment: (NOTE) SARS-CoV-2 target nucleic acids are NOT DETECTED.  The SARS-CoV-2 RNA is generally detectable in upper respiratory specimens during the acute phase of infection. The lowest concentration of SARS-CoV-2 viral copies this assay can detect is 138 copies/mL. A negative result does not preclude SARS-Cov-2 infection and should not be used as the sole basis for treatment or other patient management decisions. A negative result may occur with  improper specimen collection/handling, submission of specimen other than nasopharyngeal swab, presence of viral mutation(s) within the areas targeted by this assay, and inadequate number of viral copies(<138 copies/mL). A negative result must be combined with clinical observations, patient history, and epidemiological information. The expected result is Negative.  Fact Sheet for Patients:  03/14/21  Fact Sheet for Healthcare  Providers:  BloggerCourse.com  This test is no t yet approved or cleared by the [employer] FDA and  has been authorized for detection and/or diagnosis of SARS-CoV-2 by FDA under an Emergency Use Authorization (EUA). This EUA will remain  in effect (meaning this test can be used) for the duration of the COVID-19 declaration under Section 564(b)(1) of the Act, 21 U.S.C.section 360bbb-3(b)(1), unless the authorization is terminated  or revoked sooner.       Influenza A by PCR NEGATIVE NEGATIVE Final   Influenza B by PCR NEGATIVE NEGATIVE Final    Comment: (NOTE) The Xpert Xpress SARS-CoV-2/FLU/RSV plus assay is intended as an aid in the diagnosis of influenza from Nasopharyngeal swab specimens and should not be used as a sole  basis for treatment. Nasal washings and aspirates are unacceptable for Xpert Xpress SARS-CoV-2/FLU/RSV testing.  Fact Sheet for Patients: BloggerCourse.com  Fact Sheet for Healthcare Providers: SeriousBroker.it  This test is not yet approved or cleared by the [place] FDA and has been authorized for detection and/or diagnosis of SARS-CoV-2 by FDA under an Emergency Use Authorization (EUA). This EUA will remain in effect (meaning this test can be used) for the duration of the COVID-19 declaration under Section 564(b)(1) of the Act, 21 U.S.C. section 360bbb-3(b)(1), unless the authorization is terminated or revoked.  Performed at [facility], [address], [place], [place] [address]          Radiology Studies: MR BRAIN WO CONTRAST  Result Date: 03/12/2021 CLINICAL DATA:  Neuro deficit, acute stroke suspected. EXAM: MRI HEAD WITHOUT CONTRAST TECHNIQUE: Multiplanar, multiecho pulse sequences of the brain and surrounding structures were obtained without intravenous contrast. COMPARISON:  MRI July 08, 2014. FINDINGS: Brain: No acute infarction, hemorrhage,  hydrocephalus, extra-axial collection or mass lesion. Similar large left frontal lobe infarct with associated encephalomalacia. Similar additional infarcts in the right occipital lobe, left parietal lobe, right cerebellum, and bilateral basal ganglia. Additional confluent and patchy T2/FLAIR hyperintensities within the white matter are compatible with chronic microvascular ischemic disease. Advanced generalized atrophy with ex vacuo ventricular dilation, similar to prior. Similar evidence of superficial siderosis with susceptibility along the cerebral convexities. Vascular: Absent left M1 MCA flow void, compatible with known occlusion better characterized on same day CTA. Skull and upper cervical spine: Normal marrow signal. Sinuses/Orbits: Clear sinuses.  Unremarkable orbits. Other: No mastoid effusions. IMPRESSION: 1. No evidence of acute intracranial abnormality. Specifically, no acute infarct. 2. 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 3. Chronic left M1 occlusion better characterized on same day CTA. Electronically Signed   By: Feliberto Harts MD   On: 03/12/2021 17:14   DG Chest Port 1 View  Result Date: 03/12/2021 CLINICAL DATA:  Altered mental status EXAM: PORTABLE CHEST 1 VIEW COMPARISON:  03/10/2018 FINDINGS: The heart size and mediastinal contours are within normal limits. Both lungs are clear. The visualized skeletal structures are unremarkable. IMPRESSION: No active disease. Electronically Signed   By: Charlett Nose M.D.   On: 03/12/2021 19:50   EEG adult  Result Date: 03/13/2021 Charlsie Quest, MD     03/13/2021  8:50 AM Patient Name: [name] MRN: [id number] Epilepsy Attending: Charlsie Quest Referring Physician/Provider: Dr. [name], NP Date: 03/12/2021 Duration: 24.23 mins Patient history: 75 year old female who presented with trouble speaking and ongoing commands.  EEG to evaluate for seizures. Level of alertness: Awake  AEDs during EEG study: None Technical aspects: This EEG study was done with scalp electrodes positioned according to the 10-20 International system of electrode placement. Electrical activity was acquired at a sampling rate of 500Hz  and reviewed with a high frequency filter of 70Hz  and a low frequency filter of 1Hz . EEG data were recorded continuously and digitally stored. Description: The posterior dominant rhythm consists of 8-9 Hz activity of moderate voltage (25-35 uV) seen predominantly in posterior head regions, symmetric and reactive to eye opening and eye closing.  EEG showed sharp waves and 2 to 3 Hz delta slowing in left fronto- temporal region.  Hyperventilation and photic stimulation were not performed.   ABNORMALITY -Sharp wave, left fronto- temporal region -Intermittent slow, left fronto- temporal region IMPRESSION: This study showed evidence of epileptogenicity as well as cortical dysfunction in left frontotemporal region likely  secondary to underlying stroke.  No seizures or epileptiform discharges were seen throughout the recording. Charlsie Questriyanka O Yadav   ECHOCARDIOGRAM COMPLETE  Result Date: 03/13/2021    ECHOCARDIOGRAM REPORT   Patient Name:   [name] Date of Exam: 03/13/2021 Medical Rec #:  [id number]           Height:       63.0 in Accession #:    [id number]          Weight:       185.2 lb Date of Birth:  28-Feb-1946          BSA:          1.871 m Patient Age:    74 years            BP:           141/55 mmHg Patient Gender: F                   HR:           83 bpm. Exam Location:  Inpatient Procedure: 2D Echo, Cardiac Doppler and Color Doppler Indications:    TIA  History:        Patient has prior history of Echocardiogram examinations, most                 recent 07/09/2014. Stroke; Risk Factors:Diabetes and                 Hypertension.  Sonographer:    Shirlean KellyJohn Mendel Brown Referring Phys: [id number] Emeline GeneralPING T ZHANG IMPRESSIONS  1. Left ventricular ejection fraction, by estimation, is 60 to  65%. The left ventricle has normal function. The left ventricle has no regional wall motion abnormalities. Left ventricular diastolic parameters are consistent with Grade I diastolic dysfunction (impaired relaxation).  2. Right ventricular systolic function is normal. The right ventricular size is normal.  3. The mitral valve is abnormal. Trivial mitral valve regurgitation. No evidence of mitral stenosis.  4. The aortic valve is normal in structure. Aortic valve regurgitation is not visualized. No aortic stenosis is present.  5. The inferior vena cava is normal in size with greater than 50% respiratory variability, suggesting right atrial pressure of 3 mmHg. FINDINGS  Left Ventricle: Left ventricular ejection fraction, by estimation, is 60 to 65%. The left ventricle has normal function. The left ventricle has no regional wall motion abnormalities. The left ventricular internal cavity size was normal in size. There is  no left ventricular hypertrophy. Left ventricular diastolic parameters are consistent with Grade I diastolic dysfunction (impaired relaxation). Right Ventricle: The right ventricular size is normal. No increase in right ventricular wall thickness. Right ventricular systolic function is normal. Left Atrium: Left atrial size was normal in size. Right Atrium: Right atrial size was normal in size. Pericardium: There is no evidence of pericardial effusion. Mitral Valve: The mitral valve is abnormal. There is mild thickening of the mitral valve leaflet(s). There is mild calcification of the mitral valve leaflet(s). Trivial mitral valve regurgitation. No evidence of mitral valve stenosis. Tricuspid Valve: The tricuspid valve is normal in structure. Tricuspid valve regurgitation is trivial. No evidence of tricuspid stenosis. Aortic Valve: The aortic valve is normal in structure. Aortic valve regurgitation is not visualized. No aortic stenosis is present. Aortic valve mean gradient measures 3.0 mmHg. Aortic  valve peak gradient measures 6.6 mmHg. Aortic valve area, by VTI measures 2.72 cm. Pulmonic Valve: The pulmonic valve was normal in structure. Pulmonic valve regurgitation  is not visualized. No evidence of pulmonic stenosis. Aorta: The aortic root is normal in size and structure. Venous: The inferior vena cava is normal in size with greater than 50% respiratory variability, suggesting right atrial pressure of 3 mmHg. IAS/Shunts: No atrial level shunt detected by color flow Doppler.  LEFT VENTRICLE PLAX 2D LVIDd:         4.60 cm     Diastology LVIDs:         3.20 cm     LV e' medial:    5.33 cm/s LV PW:         1.10 cm     LV E/e' medial:  21.6 LV IVS:        1.10 cm     LV e' lateral:   6.20 cm/s LVOT diam:     2.00 cm     LV E/e' lateral: 18.5 LV SV:         61 LV SV Index:   33 LVOT Area:     3.14 cm  LV Volumes (MOD) LV vol d, MOD A2C: 82.6 ml LV vol d, MOD A4C: 83.8 ml LV vol s, MOD A2C: 42.0 ml LV vol s, MOD A4C: 41.4 ml LV SV MOD A2C:     40.6 ml LV SV MOD A4C:     83.8 ml LV SV MOD BP:      41.0 ml RIGHT VENTRICLE             IVC RV S prime:     15.70 cm/s  IVC diam: 1.30 cm TAPSE (M-mode): 1.8 cm LEFT ATRIUM             Index LA diam:        2.80 cm 1.50 cm/m LA Vol (A2C):   29.7 ml 15.87 ml/m LA Vol (A4C):   30.6 ml 16.35 ml/m LA Biplane Vol: 32.3 ml 17.26 ml/m  AORTIC VALVE AV Area (Vmax):    2.37 cm AV Area (Vmean):   2.09 cm AV Area (VTI):     2.72 cm AV Vmax:           128.00 cm/s AV Vmean:          82.200 cm/s AV VTI:            0.225 m AV Peak Grad:      6.6 mmHg AV Mean Grad:      3.0 mmHg LVOT Vmax:         96.60 cm/s LVOT Vmean:        54.600 cm/s LVOT VTI:          0.195 m LVOT/AV VTI ratio: 0.87  AORTA Ao Root diam: 2.40 cm Ao Asc diam:  2.40 cm MITRAL VALVE MV Area (PHT): 3.85 cm     SHUNTS MV Decel Time: 197 msec     Systemic VTI:  0.20 m MV E velocity: 115.00 cm/s  Systemic Diam: 2.00 cm MV A velocity: 137.00 cm/s MV E/A ratio:  0.84 Arvilla Meres MD Electronically signed by  Arvilla Meres MD Signature Date/Time: 03/13/2021/4:30:56 PM    Final    CT HEAD CODE STROKE WO CONTRAST  Result Date: 03/12/2021 CLINICAL DATA:  Code stroke.  Altered mental status, aphasia EXAM: CT HEAD WITHOUT CONTRAST CT ANGIOGRAPHY OF THE HEAD AND NECK TECHNIQUE: Contiguous axial images were obtained from the base of the skull through the vertex without intravenous contrast. Multidetector CT imaging of the head and neck was performed using the standard protocol  during bolus administration of intravenous contrast. Multiplanar CT image reconstructions and MIPs were obtained to evaluate the vascular anatomy. Carotid stenosis measurements (when applicable) are obtained utilizing NASCET criteria, using the distal internal carotid diameter as the denominator. CONTRAST:  32mL OMNIPAQUE IOHEXOL 350 MG/ML SOLN COMPARISON:  None. FINDINGS: CT HEAD Brain: There is no acute intracranial hemorrhage, mass effect, or edema. No acute appearing loss of gray-white differentiation. There is a chronic infarct of the parasagittal left frontal lobe. Chronic infarct of the left basal ganglia and adjacent white matter. Chronic infarcts of the posterior insula, left occipito-temporal junction, right cerebellum, and right occipital lobe. Additional patchy hypoattenuation in the supratentorial white matter is nonspecific but probably reflects chronic microvascular ischemic changes. There is no extra-axial fluid collection. Prominence of the ventricles and sulci reflects generalized parenchymal volume loss superimposed on ex vacuo dilatation related to infarcts. Vascular: No hyperdense vessel. Skull: Calvarium is unremarkable. Sinuses/Orbits: No acute finding. Other: None. Review of the MIP images confirms the above findings CTA NECK Aortic arch: Calcified and irregular noncalcified plaque along the arch and at the patent great vessel origins. Plaque is greatest at the left subclavian origin with less than 50% stenosis. Right carotid  system: Patent. Eccentric noncalcified plaque at the ICA origin causing less than 50% stenosis. Retropharyngeal course of the ICA. Left carotid system: Patent. No stenosis at the ICA origin. Relatively smaller caliber of the left cervical ICA likely due to intracranial findings. Vertebral arteries: Patent and codominant. Skeleton: Degenerative changes of the cervical spine primarily at C5-C6. Other neck: Negative. Upper chest: Included lungs are clear. Review of the MIP images confirms the above findings CTA HEAD Anterior circulation: Intracranial internal carotid arteries are patent with calcified plaque causing mild stenosis. Anterior cerebral arteries are patent. Anterior communicating artery is present. Distal ACA branch atherosclerotic irregularity. Right middle cerebral artery is patent. Atherosclerotic irregularity of right M2 and more distal branches with areas of moderate stenosis identified. There is likely chronic occlusion of the left middle cerebral artery with collateral formation. Posterior circulation: Intracranial vertebral arteries are patent. Basilar artery is patent. Major cerebellar artery origins are patent. Posterior cerebral arteries are patent. Venous sinuses: Patent as allowed by contrast bolus timing. Review of the MIP images confirms the above findings IMPRESSION: There is no acute intracranial hemorrhage or evidence of acute infarction. Multiple chronic infarcts. No occlusion or hemodynamically significant stenosis in the neck. 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 Medium and small branch anterior circulation atherosclerosis. Initial results were communicated to Dr. Iver Nestle at 3:22 pm on 03/12/2021 by text page via the [facility] messaging system. Electronically Signed   By: Guadlupe Spanish M.D.   On: 03/12/2021 15:32   CT ANGIO HEAD CODE STROKE  Result Date: 03/12/2021 CLINICAL DATA:  Code stroke.  Altered mental status, aphasia EXAM: CT HEAD WITHOUT CONTRAST CT  ANGIOGRAPHY OF THE HEAD AND NECK TECHNIQUE: Contiguous axial images were obtained from the base of the skull through the vertex without intravenous contrast. Multidetector CT imaging of the head and neck was performed using the standard protocol during bolus administration of intravenous contrast. Multiplanar CT image reconstructions and MIPs were obtained to evaluate the vascular anatomy. Carotid stenosis measurements (when applicable) are obtained utilizing NASCET criteria, using the distal internal carotid diameter as the denominator. CONTRAST:  69mL OMNIPAQUE IOHEXOL 350 MG/ML SOLN COMPARISON:  None. FINDINGS: CT HEAD Brain: There is no acute intracranial hemorrhage, mass effect, or edema. No acute appearing loss of gray-white differentiation. There is a  chronic infarct of the parasagittal left frontal lobe. Chronic infarct of the left basal ganglia and adjacent white matter. Chronic infarcts of the posterior insula, left occipito-temporal junction, right cerebellum, and right occipital lobe. Additional patchy hypoattenuation in the supratentorial white matter is nonspecific but probably reflects chronic microvascular ischemic changes. There is no extra-axial fluid collection. Prominence of the ventricles and sulci reflects generalized parenchymal volume loss superimposed on ex vacuo dilatation related to infarcts. Vascular: No hyperdense vessel. Skull: Calvarium is unremarkable. Sinuses/Orbits: No acute finding. Other: None. Review of the MIP images confirms the above findings CTA NECK Aortic arch: Calcified and irregular noncalcified plaque along the arch and at the patent great vessel origins. Plaque is greatest at the left subclavian origin with less than 50% stenosis. Right carotid system: Patent. Eccentric noncalcified plaque at the ICA origin causing less than 50% stenosis. Retropharyngeal course of the ICA. Left carotid system: Patent. No stenosis at the ICA origin. Relatively smaller caliber of the left  cervical ICA likely due to intracranial findings. Vertebral arteries: Patent and codominant. Skeleton: Degenerative changes of the cervical spine primarily at C5-C6. Other neck: Negative. Upper chest: Included lungs are clear. Review of the MIP images confirms the above findings CTA HEAD Anterior circulation: Intracranial internal carotid arteries are patent with calcified plaque causing mild stenosis. Anterior cerebral arteries are patent. Anterior communicating artery is present. Distal ACA branch atherosclerotic irregularity. Right middle cerebral artery is patent. Atherosclerotic irregularity of right M2 and more distal branches with areas of moderate stenosis identified. There is likely chronic occlusion of the left middle cerebral artery with collateral formation. Posterior circulation: Intracranial vertebral arteries are patent. Basilar artery is patent. Major cerebellar artery origins are patent. Posterior cerebral arteries are patent. Venous sinuses: Patent as allowed by contrast bolus timing. Review of the MIP images confirms the above findings IMPRESSION: There is no acute intracranial hemorrhage or evidence of acute infarction. Multiple chronic infarcts. No occlusion or hemodynamically significant stenosis in the neck. 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 Medium and small branch anterior circulation atherosclerosis. Initial results were communicated to Dr. Iver Nestle at 3:22 pm on 03/12/2021 by text page via the [facility] messaging system. Electronically Signed   By: Guadlupe Spanish M.D.   On: 03/12/2021 15:32   CT ANGIO NECK CODE STROKE  Result Date: 03/12/2021 CLINICAL DATA:  Code stroke.  Altered mental status, aphasia EXAM: CT HEAD WITHOUT CONTRAST CT ANGIOGRAPHY OF THE HEAD AND NECK TECHNIQUE: Contiguous axial images were obtained from the base of the skull through the vertex without intravenous contrast. Multidetector CT imaging of the head and neck was performed using the standard  protocol during bolus administration of intravenous contrast. Multiplanar CT image reconstructions and MIPs were obtained to evaluate the vascular anatomy. Carotid stenosis measurements (when applicable) are obtained utilizing NASCET criteria, using the distal internal carotid diameter as the denominator. CONTRAST:  50mL OMNIPAQUE IOHEXOL 350 MG/ML SOLN COMPARISON:  None. FINDINGS: CT HEAD Brain: There is no acute intracranial hemorrhage, mass effect, or edema. No acute appearing loss of gray-white differentiation. There is a chronic infarct of the parasagittal left frontal lobe. Chronic infarct of the left basal ganglia and adjacent white matter. Chronic infarcts of the posterior insula, left occipito-temporal junction, right cerebellum, and right occipital lobe. Additional patchy hypoattenuation in the supratentorial white matter is nonspecific but probably reflects chronic microvascular ischemic changes. There is no extra-axial fluid collection. Prominence of the ventricles and sulci reflects generalized parenchymal volume loss superimposed on ex vacuo dilatation  related to infarcts. Vascular: No hyperdense vessel. Skull: Calvarium is unremarkable. Sinuses/Orbits: No acute finding. Other: None. Review of the MIP images confirms the above findings CTA NECK Aortic arch: Calcified and irregular noncalcified plaque along the arch and at the patent great vessel origins. Plaque is greatest at the left subclavian origin with less than 50% stenosis. Right carotid system: Patent. Eccentric noncalcified plaque at the ICA origin causing less than 50% stenosis. Retropharyngeal course of the ICA. Left carotid system: Patent. No stenosis at the ICA origin. Relatively smaller caliber of the left cervical ICA likely due to intracranial findings. Vertebral arteries: Patent and codominant. Skeleton: Degenerative changes of the cervical spine primarily at C5-C6. Other neck: Negative. Upper chest: Included lungs are clear. Review of  the MIP images confirms the above findings CTA HEAD Anterior circulation: Intracranial internal carotid arteries are patent with calcified plaque causing mild stenosis. Anterior cerebral arteries are patent. Anterior communicating artery is present. Distal ACA branch atherosclerotic irregularity. Right middle cerebral artery is patent. Atherosclerotic irregularity of right M2 and more distal branches with areas of moderate stenosis identified. There is likely chronic occlusion of the left middle cerebral artery with collateral formation. Posterior circulation: Intracranial vertebral arteries are patent. Basilar artery is patent. Major cerebellar artery origins are patent. Posterior cerebral arteries are patent. Venous sinuses: Patent as allowed by contrast bolus timing. Review of the MIP images confirms the above findings IMPRESSION: There is no acute intracranial hemorrhage or evidence of acute infarction. Multiple chronic infarcts. No occlusion or hemodynamically significant stenosis in the neck. 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 Medium and small branch anterior circulation atherosclerosis. Initial results were communicated to Dr. Iver Nestle at 3:22 pm on 03/12/2021 by text page via the [facility] messaging system. Electronically Signed   By: Guadlupe Spanish M.D.   On: 03/12/2021 15:32        Scheduled Meds: . aspirin EC  81 mg Oral Daily  . atorvastatin  10 mg Oral Daily  . clopidogrel  75 mg Oral Daily  . enoxaparin (LOVENOX) injection  40 mg Subcutaneous Q24H  . insulin aspart  0-15 Units Subcutaneous TID WC  . nicotine  7 mg Transdermal Daily  . sodium chloride flush  3 mL Intravenous Once  . vitamin B-12  1,000 mcg Oral Daily   Continuous Infusions: . sodium chloride 100 mL/hr at 03/13/21 0736  . magnesium sulfate bolus IVPB       LOS: 1 day   Time spent: Greater than 50% of this time was spent in counseling, explanation of diagnosis, planning of further management,  and coordination of care.   Voice Recognition Reubin Milan dictation system was used to create this note, attempts have been made to correct errors. Please contact the author with questions and/or clarifications.   Albertine Grates, MD PhD FACP [facility]  Available via Epic secure chat 7am-7pm for nonurgent issues Please page for urgent issues To page the attending provider between 7A-7P or the covering provider during after hours 7P-7A, please log into the web site [email or web address] and access using universal [place] password for that web site. If you do not have the password, please call the hospital operator.    03/13/2021, 4:32 PM

## 2021-03-13 NOTE — Progress Notes (Incomplete)
  Echocardiogram 2D Echocardiogram has been performed.  Misty  Marthann [name] 03/13/2021, 1:37 PM

## 2021-03-14 LAB — BASIC METABOLIC PANEL
Anion gap: 9 (ref 5–15)
BUN: 9 mg/dL (ref 8–23)
CO2: 22 mmol/L (ref 22–32)
Calcium: 8.4 mg/dL — ABNORMAL LOW (ref 8.9–10.3)
Chloride: 108 mmol/L (ref 98–111)
Creatinine, Ser: 0.93 mg/dL (ref 0.44–1.00)
GFR, Estimated: >60 mL/min (ref >60)
Glucose, Bld: 139 mg/dL — ABNORMAL HIGH (ref 70–99)
Potassium: 4.1 mmol/L (ref 3.5–5.1)
Sodium: 139 mmol/L (ref 135–145)

## 2021-03-14 LAB — MAGNESIUM: Magnesium: 2.1 mg/dL (ref 1.7–2.4)

## 2021-03-14 LAB — GLUCOSE, CAPILLARY: Glucose-Capillary: 144 mg/dL — ABNORMAL HIGH (ref 70–99)

## 2021-03-14 MED ORDER — LEVETIRACETAM ER 500 MG PO TB24
500.0000 mg | ORAL_TABLET | Freq: Every day | ORAL | 0 refills | Status: DC
  Filled 2021-03-16: qty 30, 30d supply, fill #0

## 2021-03-14 MED ORDER — LEVETIRACETAM ER 500 MG PO TB24
500.0000 mg | ORAL_TABLET | Freq: Every day | ORAL | 0 refills | Status: DC
  Filled 2021-03-14: qty 30, 30d supply, fill #0

## 2021-03-14 MED ORDER — CYANOCOBALAMIN 1000 MCG PO TABS
1000.0000 ug | ORAL_TABLET | Freq: Every day | ORAL | 0 refills | Status: AC
  Filled 2021-03-14: qty 30, 30d supply, fill #0

## 2021-03-14 NOTE — Evaluation (Signed)
Physical Therapy Evaluation Patient Details Name: [name] MRN: [id number] DOB: 11/30/1945 Today's Date: 03/14/2021   History of Present Illness  Pt is a 75 y.o. female PMHx: multiple CVA with residual expressive aphasia, dementia, HTN, IIDM, HLD, chronic ambulation dysfunction, presented with altered mentation. Pt s/p EEG without seizures or epileptiform discharges but with evidence of epileptogenicity and cortical dysfunction in the left frontotemporal region- likely secondary to underlying stroke. MRI brain negative for acute abnormality.  Clinical Impression  Per patient, PTA patient lived with daughter and independent with mobility with RW. Patient with aphasia at baseline from previous CVA, but patient reports she is mumbling more. Patient min guard for OOB mobility with RW. Initially upon sitting EOB, patient became dizzy/difficulty responding at EOB and needing to lay down within ~5 seconds. BP taken in supine: 148/90 HR 101, sitting EOB: 136/89 HR 78, standing: 163/56 HR 82. RN present during BP checks. Patient presents with generalized weakness, impaired balance, decreased activity tolerance, and impaired cognition. Patient will benefit from skilled PT services during acute stay to address listed deficits. Recommend HHPT following discharge and supervision for OOB mobility.     Follow Up Recommendations Home health PT;Supervision for mobility/OOB    Equipment Recommendations  Rolling Paidyn Mcferran with 5" wheels    Recommendations for Other Services       Precautions / Restrictions Precautions Precautions: Fall;Other (comment) Precaution Comments: aphasia Restrictions Weight Bearing Restrictions: No      Mobility  Bed Mobility Overal bed mobility: Needs Assistance Bed Mobility: Supine to Sit;Sit to Supine     Supine to sit: Min assist Sit to supine: Min assist   General bed mobility comments: minA for trunk elevation and cues to scoot to EOB; pt  abruptly lying  down after sitting at EOB briefly <1 min due to dizziness. BP checked and pt not hypotensive or orthostatic    Transfers Overall transfer level: Needs assistance Equipment used: Rolling Kahley Leib (2 wheeled) Transfers: Sit to/from [employer] Sit to Stand: Min guard Stand pivot transfers: Min guard       General transfer comment: MinguardA for power up and for stability with initial standing balance  Ambulation/Gait Ambulation/Gait assistance: Min guard Gait Distance (Feet): 20 Feet Assistive device: Rolling Jemima Petko (2 wheeled) Gait Pattern/deviations: Step-through pattern;Decreased stride length;Trunk flexed;Wide base of support Gait velocity: decreased   General Gait Details: Cues for RW management as patient tends to leave RW behind  Stairs            Wheelchair Mobility    Modified Rankin (Stroke Patients Only)       Balance Overall balance assessment: Needs assistance Sitting-balance support: Bilateral upper extremity supported;Feet supported Sitting balance-Leahy Scale: Fair     Standing balance support: Bilateral upper extremity supported;Single extremity supported;No upper extremity supported;During functional activity Standing balance-Leahy Scale: Fair Standing balance comment: Pt performing toilet hygiene at commode in standing with alternating hands assisting with no LOB and no UE supported.                             Pertinent Vitals/Pain Pain Assessment: 0-10 Pain Score: 0-No pain Pain Intervention(s): Monitored during session    Home Living Family/patient expects to be discharged to:: Private residence Living Arrangements: Children (son and daughter) Available Help at Discharge: Family;Available PRN/intermittently Type of Home: House Home Access: Stairs to enter   Entrance Stairs-Number of Steps: 1 Home Layout: One level Home Equipment: Sherman Donaldson - 2 wheels  Prior Function Level of Independence: Independent with  assistive device(s)         Comments: uses RW for mobility.reports independence with ADLs. Unsure of accuracy due to aphasia     Hand Dominance   Dominant Hand: Right    Extremity/Trunk Assessment   Upper Extremity Assessment Upper Extremity Assessment: Generalized weakness    Lower Extremity Assessment Lower Extremity Assessment: Generalized weakness    Cervical / Trunk Assessment Cervical / Trunk Assessment: Normal  Communication   Communication: Expressive difficulties  Cognition Arousal/Alertness: Awake/alert Behavior During Therapy: WFL for tasks assessed/performed Overall Cognitive Status: No family/caregiver present to determine baseline cognitive functioning                                 General Comments: pt following all commands      General Comments General comments (skin integrity, edema, etc.): Pt became dizzy/difficulty responding at EOB and needing to lie down within 5 secs and  BP taken in supine: 148/90, 101 BPM; sitting EOB 136/89 (101), 78 BPM; standing 163/56, 82 BPM. O2 >90% on RA. Pt with no dizziness after 1 episode. RN in room for BP checks.    Exercises     Assessment/Plan    PT Assessment Patient needs continued PT services  PT Problem List Decreased strength;Decreased activity tolerance;Decreased balance;Decreased mobility;Decreased safety awareness       PT Treatment Interventions Gait training;DME instruction;Therapeutic activities;Stair training;Functional mobility training;Therapeutic exercise;Balance training    PT Goals (Current goals can be found in the Care Plan section)  Acute Rehab PT Goals Patient Stated Goal: to go home PT Goal Formulation: With patient Time For Goal Achievement: 03/28/21 Potential to Achieve Goals: Good    Frequency Min 3X/week   Barriers to discharge        Co-evaluation PT/OT/SLP Co-Evaluation/Treatment: Yes Reason for Co-Treatment: Complexity of the patient's impairments  (multi-system involvement);For patient/therapist safety PT goals addressed during session: Mobility/safety with mobility;Proper use of DME;Balance OT goals addressed during session: ADL's and self-care       AM-PAC PT "6 Clicks" Mobility  Outcome Measure Help needed turning from your back to your side while in a flat bed without using bedrails?: A Little Help needed moving from lying on your back to sitting on the side of a flat bed without using bedrails?: A Little Help needed moving to and from a bed to a chair (including a wheelchair)?: A Little Help needed standing up from a chair using your arms (e.g., wheelchair or bedside chair)?: A Little Help needed to walk in hospital room?: A Little Help needed climbing 3-5 steps with a railing? : A Lot 6 Click Score: 17    End of Session Equipment Utilized During Treatment: Gait belt Activity Tolerance: Patient tolerated treatment well Patient left: in chair;with call bell/phone within reach;with chair alarm set Nurse Communication: Mobility status PT Visit Diagnosis: Unsteadiness on feet (R26.81);Muscle weakness (generalized) (M62.81)    Time: 1478-2956 PT Time Calculation (min) (ACUTE ONLY): 38 min   Charges:   PT Evaluation $PT Eval Low Complexity: 1 Low PT Treatments $Therapeutic Activity: 8-22 mins        Royetta Probus A. Dan Humphreys PT, DPT Acute Rehabilitation Services Pager [phone] Office [phone]   Viviann Spare 03/14/2021, 3:03 PM

## 2021-03-14 NOTE — Care Management (Signed)
Spoke w [employer] in pharmacy who requested written script for 2 days worth of Keppra so [atient can be sent home with med in hand as her pharmacy at [facility] is closed over the weekend. Message sent to attending requesting script.  Pharmacy filling and will send to unit to go home w patient

## 2021-03-14 NOTE — Evaluation (Addendum)
Occupational Therapy Evaluation Patient Details Name: [name] MRN: [id number] DOB: 15-Jun-1946 Today's Date: 03/14/2021    History of Present Illness Pt is a 75 y.o. female PMHx: multiple CVA with residual expressive aphasia, dementia, HTN, IIDM, HLD, chronic ambulation dysfunction, presented with altered mentation. Pt s/p EEG without seizures or epileptiform discharges but with evidence of epileptogenicity and cortical dysfunction in the left frontotemporal region- likely secondary to underlying stroke. MRI brain negative for acute abnormality.   Clinical Impression   Pt PTA: Pt living with daughter and her daughter is her main caregiver per chart. Pt has aphasia at baseline from previous CVA. Pt currently SupervisionA to minguardA for ADL and mobility with RW.  Pt performing grooming at sink with supervisionA.   Pt became dizzy/difficulty responding at EOB and needing to lie down within 5 secs and  BP taken in supine: 148/90, 101 BPM;  sitting EOB 136/89 (101), 78 BPM;  standing 163/56, 82 BPM.  O2 >90% on RA.  Pt with no dizziness after 1 episode. RN in room for BP checks.Pt would benefit from continued OT skilled services for ADL, mobility and cognition in HHOT setting. OT following acutely. ** Pt could return home from OT perspective as long as pt has support at home.    Follow Up Recommendations  Home health OT;Supervision - Intermittent    Equipment Recommendations  None recommended by OT    Recommendations for Other Services       Precautions / Restrictions Precautions Precautions: Fall;Other (comment) Precaution Comments: aphasia Restrictions Weight Bearing Restrictions: No      Mobility Bed Mobility Overal bed mobility: Needs Assistance Bed Mobility: Supine to Sit;Sit to Supine     Supine to sit: Min assist Sit to supine: Min assist   General bed mobility comments: minA for trunk elevation and cues to scoot to EOB; pt  abruptly lying down after  sitting at EOB briefly <1 min due to dizziness. BP checked and pt not hypotensive or orthostatic    Transfers Overall transfer level: Needs assistance Equipment used: Rolling walker (2 wheeled) Transfers: Sit to/from [employer] Sit to Stand: Min guard Stand pivot transfers: Min guard       General transfer comment: MinguardA for power up and for stability with initial standing balance    Balance Overall balance assessment: Needs assistance Sitting-balance support: Bilateral upper extremity supported;Feet supported Sitting balance-Leahy Scale: Fair     Standing balance support: Bilateral upper extremity supported;Single extremity supported;No upper extremity supported;During functional activity Standing balance-Leahy Scale: Fair Standing balance comment: Pt performing toilet hygiene at commode in standing with alternating hands assisting with no LOB and no UE supported.                           ADL either performed or assessed with clinical judgement   ADL Overall ADL's : At baseline;Modified independent                                       General ADL Comments: SupervisionA advised at this time. pt performing grooming at sink, toilet hygiene in standing and LB bathing.     Vision Baseline Vision/History: No visual deficits Patient Visual Report: No change from baseline Vision Assessment?: Yes Eye Alignment: Within Functional Limits Ocular Range of Motion: Within Functional Limits Alignment/Gaze Preference: Within Defined Limits     Perception  Praxis      Pertinent Vitals/Pain Pain Assessment: 0-10 Pain Score: 0-No pain Pain Intervention(s): Monitored during session     Hand Dominance Right   Extremity/Trunk Assessment Upper Extremity Assessment Upper Extremity Assessment: Generalized weakness   Lower Extremity Assessment Lower Extremity Assessment: Generalized weakness   Cervical / Trunk  Assessment Cervical / Trunk Assessment: Normal   Communication Communication Communication: Expressive difficulties   Cognition Arousal/Alertness: Awake/alert Behavior During Therapy: WFL for tasks assessed/performed Overall Cognitive Status: No family/caregiver present to determine baseline cognitive functioning                                 General Comments: pt following all commands   General Comments  Pt became dizzy/difficulty responding at EOB and needing to lie down within 5 secs and  BP taken in supine: 148/90, 101 BPM; sitting EOB 136/89 (101), 78 BPM; standing 163/56, 82 BPM. O2 >90% on RA. Pt with no dizziness after 1 episode. RN in room for BP checks.    Exercises     Shoulder Instructions      Home Living Family/patient expects to be discharged to:: Private residence Living Arrangements: Children (son and daughter) Available Help at Discharge: Family;Available PRN/intermittently Type of Home: House Home Access: Stairs to enter [employer] of Steps: 1   Home Layout: One level     Bathroom Shower/Tub: Producer, television/film/video: Handicapped height     Home Equipment: Environmental consultant - 2 wheels          Prior Functioning/Environment Level of Independence: Independent with assistive device(s)        Comments: uses RW for mobility.reports independence with ADLs. Unsure of accuracy due to aphasia        OT Problem List: Decreased safety awareness      OT Treatment/Interventions: Self-care/ADL training;Therapeutic exercise;Energy conservation;Therapeutic activities;Patient/family education;Balance training;Visual/perceptual remediation/compensation;Cognitive remediation/compensation    OT Goals(Current goals can be found in the care plan section) Acute Rehab OT Goals Patient Stated Goal: to go home OT Goal Formulation: With patient Time For Goal Achievement: 03/28/21 Potential to Achieve Goals: Good ADL Goals Additional ADL  Goal #1: pt will follow all 1 step commands with 100% accuracy, Additional ADL Goal #2: Pt performing OOB ADL x10 mins with modified independence.  OT Frequency: Min 2X/week   Barriers to D/C:            Co-evaluation PT/OT/SLP Co-Evaluation/Treatment: Yes Reason for Co-Treatment: Complexity of the patient's impairments (multi-system involvement);For patient/therapist safety   OT goals addressed during session: ADL's and self-care      AM-PAC OT "6 Clicks" Daily Activity     Outcome Measure Help from another person eating meals?: None Help from another person taking care of personal grooming?: A Little Help from another person toileting, which includes using toliet, bedpan, or urinal?: A Little Help from another person bathing (including washing, rinsing, drying)?: A Little Help from another person to put on and taking off regular upper body clothing?: None Help from another person to put on and taking off regular lower body clothing?: A Little 6 Click Score: 20   End of Session Equipment Utilized During Treatment: Oxygen Nurse Communication: Mobility status  Activity Tolerance: Patient tolerated treatment well Patient left: in chair;with call bell/phone within reach;with chair alarm set                   Time: 1610-9604 OT Time Calculation (min):  38 min Charges:  OT General Charges $OT Visit: 1 Visit OT Evaluation $OT Eval Moderate Complexity: 1 Mod OT Treatments $Self Care/Home Management : 8-22 mins  Flora Lipps, OTR/L Acute Rehabilitation Services Pager: [phone] Office: [phone]   Delance Weide C 03/14/2021, 2:32 PM

## 2021-03-15 NOTE — Discharge Summary (Signed)
[name] IWL:[id number] DOB: October 19, 1946 DOA: 03/12/2021  PCP: Hoy Register, MD  Admit date: 03/12/2021  Discharge date: 03/15/2021  Admitted From: Home   disposition: Home health PT   Recommendations for Outpatient Follow-up:   Follow up with PCP in 1-2 weeks Home Health: Home health PT Equipment/Devices: Rolling walker with 5 well Consultations: Neurology Discharge Condition: Improved CODE STATUS: Full Diet Recommendation: Heart Healthy   Diet Order            Diet - low sodium heart healthy           Diet Carb Modified                  Chief Complaint  Patient presents with  . Code Stroke     Brief history of present illness from the day of admission and additional interim summary    [name] is a 75 y.o. female with medical history significant of multiple CVA with residual expressive aphasia, dementia, HTN, IIDM, HLD, chronic ambulation dysfunction, presented with altered mentation.  Daughter at bedside gave most histories.  Daughter lives with patient and is attending caregiver.  She was at work this afternoon around 14:00, and was called by patient's grandson over the phone who reported that the patient appeared to have one-sided weakness and could not lift her left leg/foot above the front door and appeared to stop talking.  At baseline, patient very active, walking around house and still smoking cigarettes.  Daughter reported patient occasionally has episodes of confusion and staring. In ED, reported patient mentation has now back to baseline.  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                                                                   Hospital Course   Patient underwent  work-up for worsening of expressive aphasia and questionable seizure.  Stroke was ruled out by negative MRI.  EEG was done which showed "evidence of epileptogenicity as well as cortical dysfunction" but no epileptiform discharges.  Patient was seen by neurology in the morning who initiated Keppra.  Patient was discharged home on Keppra to follow-up with outpatient neurology.  No changes were made to her baseline diabetes and hypertensive medications.  She is continued on aspirin, Plavix and Lipitor for her previous stroke.  NCT head There is no acute intracranial hemorrhage, mass effect, or edema. No acute appearing loss of gray-white differentiation. There is a chronic infarct of the parasagittal left frontal lobe. Chronic infarct of the left basal ganglia and adjacent white matter. Chronic infarcts of the posterior insula, left occipito-temporal junction, right cerebellum, and right occipital lobe. Additional patchy hypoattenuation in the supratentorial white matter is  03/13/2021  8:50 AM Patient Name: [name] MRN: [id number] Epilepsy Attending: Charlsie Quest Referring Physician/Provider: Dr. [name], NP Date: 03/12/2021 Duration: 24.23 mins Patient history: 75 year old female who presented with trouble speaking and ongoing commands.  EEG to evaluate for seizures. Level of alertness: Awake AEDs during EEG study: None Technical aspects: This EEG study was done with scalp electrodes positioned according to the 10-20 International system of electrode placement. Electrical activity was acquired at a sampling rate of 500Hz  and reviewed with a high frequency filter of 70Hz  and a low frequency filter of 1Hz . EEG data were recorded continuously and digitally stored. Description: The posterior dominant rhythm consists of 8-9 Hz activity of moderate voltage (25-35 uV) seen predominantly in posterior head regions, symmetric and reactive to eye opening and eye closing.  EEG showed sharp waves and 2 to 3 Hz delta slowing in left fronto- temporal region.  Hyperventilation and photic stimulation were not performed.   ABNORMALITY -Sharp wave, left fronto- temporal region -Intermittent slow, left fronto- temporal region IMPRESSION: This study showed evidence of epileptogenicity as well as cortical dysfunction in left frontotemporal region likely secondary to underlying stroke.  No seizures or epileptiform discharges were seen throughout the recording.   ECHOCARDIOGRAM COMPLETE  Result Date: 03/13/2021    ECHOCARDIOGRAM REPORT   Patient Name:   [name] Date of Exam: 03/13/2021 Medical Rec #:  03/15/2021           Height:       63.0 in Accession #:    [name]          Weight:       185.2 lb Date of  Birth:  [birth year]          BSA:          1.871 m Patient Age:    74 years            BP:           141/55 mmHg Patient Gender: F                   HR:           83 bpm. Exam Location:  Inpatient Procedure: 2D Echo, Cardiac Doppler and Color Doppler Indications:    TIA  History:        Patient has prior history of Echocardiogram examinations, most                 recent 07/09/2014. Stroke; Risk Factors:Diabetes and                 Hypertension.  Sonographer:    [id number] Referring Phys: 11/15/1946 07/11/2014 IMPRESSIONS  1. Left ventricular ejection fraction, by estimation, is 60 to 65%. The left ventricle has normal function. The left ventricle has no regional wall motion abnormalities. Left ventricular diastolic parameters are consistent with Grade I diastolic dysfunction (impaired relaxation).  2. Right ventricular systolic function is normal. The right ventricular size is normal.  3. The mitral valve is abnormal. Trivial mitral valve regurgitation. No evidence of mitral stenosis.  4. The aortic valve is normal in structure. Aortic valve regurgitation is not visualized. No aortic stenosis is present.  5. The inferior vena cava is normal in size with greater than 50% respiratory variability, suggesting right atrial pressure of 3 mmHg. FINDINGS  Left Ventricle: Left ventricular ejection fraction, by estimation, is 60 to 65%. The left ventricle has normal function.  [name] IWL:[id number] DOB: October 19, 1946 DOA: 03/12/2021  PCP: Hoy Register, MD  Admit date: 03/12/2021  Discharge date: 03/15/2021  Admitted From: Home   disposition: Home health PT   Recommendations for Outpatient Follow-up:   Follow up with PCP in 1-2 weeks Home Health: Home health PT Equipment/Devices: Rolling walker with 5 well Consultations: Neurology Discharge Condition: Improved CODE STATUS: Full Diet Recommendation: Heart Healthy   Diet Order            Diet - low sodium heart healthy           Diet Carb Modified                  Chief Complaint  Patient presents with  . Code Stroke     Brief history of present illness from the day of admission and additional interim summary    [name] is a 75 y.o. female with medical history significant of multiple CVA with residual expressive aphasia, dementia, HTN, IIDM, HLD, chronic ambulation dysfunction, presented with altered mentation.  Daughter at bedside gave most histories.  Daughter lives with patient and is attending caregiver.  She was at work this afternoon around 14:00, and was called by patient's grandson over the phone who reported that the patient appeared to have one-sided weakness and could not lift her left leg/foot above the front door and appeared to stop talking.  At baseline, patient very active, walking around house and still smoking cigarettes.  Daughter reported patient occasionally has episodes of confusion and staring. In ED, reported patient mentation has now back to baseline.  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                                                                   Hospital Course   Patient underwent  work-up for worsening of expressive aphasia and questionable seizure.  Stroke was ruled out by negative MRI.  EEG was done which showed "evidence of epileptogenicity as well as cortical dysfunction" but no epileptiform discharges.  Patient was seen by neurology in the morning who initiated Keppra.  Patient was discharged home on Keppra to follow-up with outpatient neurology.  No changes were made to her baseline diabetes and hypertensive medications.  She is continued on aspirin, Plavix and Lipitor for her previous stroke.  NCT head There is no acute intracranial hemorrhage, mass effect, or edema. No acute appearing loss of gray-white differentiation. There is a chronic infarct of the parasagittal left frontal lobe. Chronic infarct of the left basal ganglia and adjacent white matter. Chronic infarcts of the posterior insula, left occipito-temporal junction, right cerebellum, and right occipital lobe. Additional patchy hypoattenuation in the supratentorial white matter is  03/13/2021  8:50 AM Patient Name: [name] MRN: [id number] Epilepsy Attending: Charlsie Quest Referring Physician/Provider: Dr. [name], NP Date: 03/12/2021 Duration: 24.23 mins Patient history: 75 year old female who presented with trouble speaking and ongoing commands.  EEG to evaluate for seizures. Level of alertness: Awake AEDs during EEG study: None Technical aspects: This EEG study was done with scalp electrodes positioned according to the 10-20 International system of electrode placement. Electrical activity was acquired at a sampling rate of 500Hz  and reviewed with a high frequency filter of 70Hz  and a low frequency filter of 1Hz . EEG data were recorded continuously and digitally stored. Description: The posterior dominant rhythm consists of 8-9 Hz activity of moderate voltage (25-35 uV) seen predominantly in posterior head regions, symmetric and reactive to eye opening and eye closing.  EEG showed sharp waves and 2 to 3 Hz delta slowing in left fronto- temporal region.  Hyperventilation and photic stimulation were not performed.   ABNORMALITY -Sharp wave, left fronto- temporal region -Intermittent slow, left fronto- temporal region IMPRESSION: This study showed evidence of epileptogenicity as well as cortical dysfunction in left frontotemporal region likely secondary to underlying stroke.  No seizures or epileptiform discharges were seen throughout the recording.   ECHOCARDIOGRAM COMPLETE  Result Date: 03/13/2021    ECHOCARDIOGRAM REPORT   Patient Name:   [name] Date of Exam: 03/13/2021 Medical Rec #:  03/15/2021           Height:       63.0 in Accession #:    [name]          Weight:       185.2 lb Date of  Birth:  [birth year]          BSA:          1.871 m Patient Age:    74 years            BP:           141/55 mmHg Patient Gender: F                   HR:           83 bpm. Exam Location:  Inpatient Procedure: 2D Echo, Cardiac Doppler and Color Doppler Indications:    TIA  History:        Patient has prior history of Echocardiogram examinations, most                 recent 07/09/2014. Stroke; Risk Factors:Diabetes and                 Hypertension.  Sonographer:    [id number] Referring Phys: 11/15/1946 07/11/2014 IMPRESSIONS  1. Left ventricular ejection fraction, by estimation, is 60 to 65%. The left ventricle has normal function. The left ventricle has no regional wall motion abnormalities. Left ventricular diastolic parameters are consistent with Grade I diastolic dysfunction (impaired relaxation).  2. Right ventricular systolic function is normal. The right ventricular size is normal.  3. The mitral valve is abnormal. Trivial mitral valve regurgitation. No evidence of mitral stenosis.  4. The aortic valve is normal in structure. Aortic valve regurgitation is not visualized. No aortic stenosis is present.  5. The inferior vena cava is normal in size with greater than 50% respiratory variability, suggesting right atrial pressure of 3 mmHg. FINDINGS  Left Ventricle: Left ventricular ejection fraction, by estimation, is 60 to 65%. The left ventricle has normal function.  03/13/2021  8:50 AM Patient Name: [name] MRN: [id number] Epilepsy Attending: Charlsie Quest Referring Physician/Provider: Dr. [name], NP Date: 03/12/2021 Duration: 24.23 mins Patient history: 75 year old female who presented with trouble speaking and ongoing commands.  EEG to evaluate for seizures. Level of alertness: Awake AEDs during EEG study: None Technical aspects: This EEG study was done with scalp electrodes positioned according to the 10-20 International system of electrode placement. Electrical activity was acquired at a sampling rate of 500Hz  and reviewed with a high frequency filter of 70Hz  and a low frequency filter of 1Hz . EEG data were recorded continuously and digitally stored. Description: The posterior dominant rhythm consists of 8-9 Hz activity of moderate voltage (25-35 uV) seen predominantly in posterior head regions, symmetric and reactive to eye opening and eye closing.  EEG showed sharp waves and 2 to 3 Hz delta slowing in left fronto- temporal region.  Hyperventilation and photic stimulation were not performed.   ABNORMALITY -Sharp wave, left fronto- temporal region -Intermittent slow, left fronto- temporal region IMPRESSION: This study showed evidence of epileptogenicity as well as cortical dysfunction in left frontotemporal region likely secondary to underlying stroke.  No seizures or epileptiform discharges were seen throughout the recording.   ECHOCARDIOGRAM COMPLETE  Result Date: 03/13/2021    ECHOCARDIOGRAM REPORT   Patient Name:   [name] Date of Exam: 03/13/2021 Medical Rec #:  03/15/2021           Height:       63.0 in Accession #:    [name]          Weight:       185.2 lb Date of  Birth:  [birth year]          BSA:          1.871 m Patient Age:    74 years            BP:           141/55 mmHg Patient Gender: F                   HR:           83 bpm. Exam Location:  Inpatient Procedure: 2D Echo, Cardiac Doppler and Color Doppler Indications:    TIA  History:        Patient has prior history of Echocardiogram examinations, most                 recent 07/09/2014. Stroke; Risk Factors:Diabetes and                 Hypertension.  Sonographer:    [id number] Referring Phys: 11/15/1946 07/11/2014 IMPRESSIONS  1. Left ventricular ejection fraction, by estimation, is 60 to 65%. The left ventricle has normal function. The left ventricle has no regional wall motion abnormalities. Left ventricular diastolic parameters are consistent with Grade I diastolic dysfunction (impaired relaxation).  2. Right ventricular systolic function is normal. The right ventricular size is normal.  3. The mitral valve is abnormal. Trivial mitral valve regurgitation. No evidence of mitral stenosis.  4. The aortic valve is normal in structure. Aortic valve regurgitation is not visualized. No aortic stenosis is present.  5. The inferior vena cava is normal in size with greater than 50% respiratory variability, suggesting right atrial pressure of 3 mmHg. FINDINGS  Left Ventricle: Left ventricular ejection fraction, by estimation, is 60 to 65%. The left ventricle has normal function.  [name] IWL:[id number] DOB: October 19, 1946 DOA: 03/12/2021  PCP: Hoy Register, MD  Admit date: 03/12/2021  Discharge date: 03/15/2021  Admitted From: Home   disposition: Home health PT   Recommendations for Outpatient Follow-up:   Follow up with PCP in 1-2 weeks Home Health: Home health PT Equipment/Devices: Rolling walker with 5 well Consultations: Neurology Discharge Condition: Improved CODE STATUS: Full Diet Recommendation: Heart Healthy   Diet Order            Diet - low sodium heart healthy           Diet Carb Modified                  Chief Complaint  Patient presents with  . Code Stroke     Brief history of present illness from the day of admission and additional interim summary    [name] is a 75 y.o. female with medical history significant of multiple CVA with residual expressive aphasia, dementia, HTN, IIDM, HLD, chronic ambulation dysfunction, presented with altered mentation.  Daughter at bedside gave most histories.  Daughter lives with patient and is attending caregiver.  She was at work this afternoon around 14:00, and was called by patient's grandson over the phone who reported that the patient appeared to have one-sided weakness and could not lift her left leg/foot above the front door and appeared to stop talking.  At baseline, patient very active, walking around house and still smoking cigarettes.  Daughter reported patient occasionally has episodes of confusion and staring. In ED, reported patient mentation has now back to baseline.  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                                                                   Hospital Course   Patient underwent  work-up for worsening of expressive aphasia and questionable seizure.  Stroke was ruled out by negative MRI.  EEG was done which showed "evidence of epileptogenicity as well as cortical dysfunction" but no epileptiform discharges.  Patient was seen by neurology in the morning who initiated Keppra.  Patient was discharged home on Keppra to follow-up with outpatient neurology.  No changes were made to her baseline diabetes and hypertensive medications.  She is continued on aspirin, Plavix and Lipitor for her previous stroke.  NCT head There is no acute intracranial hemorrhage, mass effect, or edema. No acute appearing loss of gray-white differentiation. There is a chronic infarct of the parasagittal left frontal lobe. Chronic infarct of the left basal ganglia and adjacent white matter. Chronic infarcts of the posterior insula, left occipito-temporal junction, right cerebellum, and right occipital lobe. Additional patchy hypoattenuation in the supratentorial white matter is  03/13/2021  8:50 AM Patient Name: [name] MRN: [id number] Epilepsy Attending: Charlsie Quest Referring Physician/Provider: Dr. [name], NP Date: 03/12/2021 Duration: 24.23 mins Patient history: 75 year old female who presented with trouble speaking and ongoing commands.  EEG to evaluate for seizures. Level of alertness: Awake AEDs during EEG study: None Technical aspects: This EEG study was done with scalp electrodes positioned according to the 10-20 International system of electrode placement. Electrical activity was acquired at a sampling rate of 500Hz  and reviewed with a high frequency filter of 70Hz  and a low frequency filter of 1Hz . EEG data were recorded continuously and digitally stored. Description: The posterior dominant rhythm consists of 8-9 Hz activity of moderate voltage (25-35 uV) seen predominantly in posterior head regions, symmetric and reactive to eye opening and eye closing.  EEG showed sharp waves and 2 to 3 Hz delta slowing in left fronto- temporal region.  Hyperventilation and photic stimulation were not performed.   ABNORMALITY -Sharp wave, left fronto- temporal region -Intermittent slow, left fronto- temporal region IMPRESSION: This study showed evidence of epileptogenicity as well as cortical dysfunction in left frontotemporal region likely secondary to underlying stroke.  No seizures or epileptiform discharges were seen throughout the recording.   ECHOCARDIOGRAM COMPLETE  Result Date: 03/13/2021    ECHOCARDIOGRAM REPORT   Patient Name:   [name] Date of Exam: 03/13/2021 Medical Rec #:  03/15/2021           Height:       63.0 in Accession #:    [name]          Weight:       185.2 lb Date of  Birth:  [birth year]          BSA:          1.871 m Patient Age:    74 years            BP:           141/55 mmHg Patient Gender: F                   HR:           83 bpm. Exam Location:  Inpatient Procedure: 2D Echo, Cardiac Doppler and Color Doppler Indications:    TIA  History:        Patient has prior history of Echocardiogram examinations, most                 recent 07/09/2014. Stroke; Risk Factors:Diabetes and                 Hypertension.  Sonographer:    [id number] Referring Phys: 11/15/1946 07/11/2014 IMPRESSIONS  1. Left ventricular ejection fraction, by estimation, is 60 to 65%. The left ventricle has normal function. The left ventricle has no regional wall motion abnormalities. Left ventricular diastolic parameters are consistent with Grade I diastolic dysfunction (impaired relaxation).  2. Right ventricular systolic function is normal. The right ventricular size is normal.  3. The mitral valve is abnormal. Trivial mitral valve regurgitation. No evidence of mitral stenosis.  4. The aortic valve is normal in structure. Aortic valve regurgitation is not visualized. No aortic stenosis is present.  5. The inferior vena cava is normal in size with greater than 50% respiratory variability, suggesting right atrial pressure of 3 mmHg. FINDINGS  Left Ventricle: Left ventricular ejection fraction, by estimation, is 60 to 65%. The left ventricle has normal function.  [name] IWL:[id number] DOB: October 19, 1946 DOA: 03/12/2021  PCP: Hoy Register, MD  Admit date: 03/12/2021  Discharge date: 03/15/2021  Admitted From: Home   disposition: Home health PT   Recommendations for Outpatient Follow-up:   Follow up with PCP in 1-2 weeks Home Health: Home health PT Equipment/Devices: Rolling walker with 5 well Consultations: Neurology Discharge Condition: Improved CODE STATUS: Full Diet Recommendation: Heart Healthy   Diet Order            Diet - low sodium heart healthy           Diet Carb Modified                  Chief Complaint  Patient presents with  . Code Stroke     Brief history of present illness from the day of admission and additional interim summary    [name] is a 75 y.o. female with medical history significant of multiple CVA with residual expressive aphasia, dementia, HTN, IIDM, HLD, chronic ambulation dysfunction, presented with altered mentation.  Daughter at bedside gave most histories.  Daughter lives with patient and is attending caregiver.  She was at work this afternoon around 14:00, and was called by patient's grandson over the phone who reported that the patient appeared to have one-sided weakness and could not lift her left leg/foot above the front door and appeared to stop talking.  At baseline, patient very active, walking around house and still smoking cigarettes.  Daughter reported patient occasionally has episodes of confusion and staring. In ED, reported patient mentation has now back to baseline.  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                                                                   Hospital Course   Patient underwent  work-up for worsening of expressive aphasia and questionable seizure.  Stroke was ruled out by negative MRI.  EEG was done which showed "evidence of epileptogenicity as well as cortical dysfunction" but no epileptiform discharges.  Patient was seen by neurology in the morning who initiated Keppra.  Patient was discharged home on Keppra to follow-up with outpatient neurology.  No changes were made to her baseline diabetes and hypertensive medications.  She is continued on aspirin, Plavix and Lipitor for her previous stroke.  NCT head There is no acute intracranial hemorrhage, mass effect, or edema. No acute appearing loss of gray-white differentiation. There is a chronic infarct of the parasagittal left frontal lobe. Chronic infarct of the left basal ganglia and adjacent white matter. Chronic infarcts of the posterior insula, left occipito-temporal junction, right cerebellum, and right occipital lobe. Additional patchy hypoattenuation in the supratentorial white matter is  [name] IWL:[id number] DOB: October 19, 1946 DOA: 03/12/2021  PCP: Hoy Register, MD  Admit date: 03/12/2021  Discharge date: 03/15/2021  Admitted From: Home   disposition: Home health PT   Recommendations for Outpatient Follow-up:   Follow up with PCP in 1-2 weeks Home Health: Home health PT Equipment/Devices: Rolling walker with 5 well Consultations: Neurology Discharge Condition: Improved CODE STATUS: Full Diet Recommendation: Heart Healthy   Diet Order            Diet - low sodium heart healthy           Diet Carb Modified                  Chief Complaint  Patient presents with  . Code Stroke     Brief history of present illness from the day of admission and additional interim summary    [name] is a 75 y.o. female with medical history significant of multiple CVA with residual expressive aphasia, dementia, HTN, IIDM, HLD, chronic ambulation dysfunction, presented with altered mentation.  Daughter at bedside gave most histories.  Daughter lives with patient and is attending caregiver.  She was at work this afternoon around 14:00, and was called by patient's grandson over the phone who reported that the patient appeared to have one-sided weakness and could not lift her left leg/foot above the front door and appeared to stop talking.  At baseline, patient very active, walking around house and still smoking cigarettes.  Daughter reported patient occasionally has episodes of confusion and staring. In ED, reported patient mentation has now back to baseline.  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                                                                   Hospital Course   Patient underwent  work-up for worsening of expressive aphasia and questionable seizure.  Stroke was ruled out by negative MRI.  EEG was done which showed "evidence of epileptogenicity as well as cortical dysfunction" but no epileptiform discharges.  Patient was seen by neurology in the morning who initiated Keppra.  Patient was discharged home on Keppra to follow-up with outpatient neurology.  No changes were made to her baseline diabetes and hypertensive medications.  She is continued on aspirin, Plavix and Lipitor for her previous stroke.  NCT head There is no acute intracranial hemorrhage, mass effect, or edema. No acute appearing loss of gray-white differentiation. There is a chronic infarct of the parasagittal left frontal lobe. Chronic infarct of the left basal ganglia and adjacent white matter. Chronic infarcts of the posterior insula, left occipito-temporal junction, right cerebellum, and right occipital lobe. Additional patchy hypoattenuation in the supratentorial white matter is  [name] IWL:[id number] DOB: October 19, 1946 DOA: 03/12/2021  PCP: Hoy Register, MD  Admit date: 03/12/2021  Discharge date: 03/15/2021  Admitted From: Home   disposition: Home health PT   Recommendations for Outpatient Follow-up:   Follow up with PCP in 1-2 weeks Home Health: Home health PT Equipment/Devices: Rolling walker with 5 well Consultations: Neurology Discharge Condition: Improved CODE STATUS: Full Diet Recommendation: Heart Healthy   Diet Order            Diet - low sodium heart healthy           Diet Carb Modified                  Chief Complaint  Patient presents with  . Code Stroke     Brief history of present illness from the day of admission and additional interim summary    [name] is a 75 y.o. female with medical history significant of multiple CVA with residual expressive aphasia, dementia, HTN, IIDM, HLD, chronic ambulation dysfunction, presented with altered mentation.  Daughter at bedside gave most histories.  Daughter lives with patient and is attending caregiver.  She was at work this afternoon around 14:00, and was called by patient's grandson over the phone who reported that the patient appeared to have one-sided weakness and could not lift her left leg/foot above the front door and appeared to stop talking.  At baseline, patient very active, walking around house and still smoking cigarettes.  Daughter reported patient occasionally has episodes of confusion and staring. In ED, reported patient mentation has now back to baseline.  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                                                                   Hospital Course   Patient underwent  work-up for worsening of expressive aphasia and questionable seizure.  Stroke was ruled out by negative MRI.  EEG was done which showed "evidence of epileptogenicity as well as cortical dysfunction" but no epileptiform discharges.  Patient was seen by neurology in the morning who initiated Keppra.  Patient was discharged home on Keppra to follow-up with outpatient neurology.  No changes were made to her baseline diabetes and hypertensive medications.  She is continued on aspirin, Plavix and Lipitor for her previous stroke.  NCT head There is no acute intracranial hemorrhage, mass effect, or edema. No acute appearing loss of gray-white differentiation. There is a chronic infarct of the parasagittal left frontal lobe. Chronic infarct of the left basal ganglia and adjacent white matter. Chronic infarcts of the posterior insula, left occipito-temporal junction, right cerebellum, and right occipital lobe. Additional patchy hypoattenuation in the supratentorial white matter is

## 2021-03-16 ENCOUNTER — Telehealth: Payer: Self-pay

## 2021-03-16 ENCOUNTER — Other Ambulatory Visit: Payer: Self-pay

## 2021-03-16 MED FILL — Metformin HCl Tab 500 MG: ORAL | 30 days supply | Qty: 120 | Fill #0 | Status: AC

## 2021-03-16 MED FILL — Clopidogrel Bisulfate Tab 75 MG (Base Equiv): ORAL | 30 days supply | Qty: 30 | Fill #0 | Status: AC

## 2021-03-16 MED FILL — Lisinopril Tab 10 MG: ORAL | 30 days supply | Qty: 30 | Fill #0 | Status: AC

## 2021-03-16 MED FILL — Atorvastatin Calcium Tab 10 MG (Base Equivalent): ORAL | 30 days supply | Qty: 30 | Fill #0 | Status: AC

## 2021-03-16 MED FILL — Atenolol Tab 25 MG: ORAL | 30 days supply | Qty: 30 | Fill #0 | Status: AC

## 2021-03-16 NOTE — Telephone Encounter (Signed)
Transition Care Management Unsuccessful Follow-up Telephone Call  Date of discharge and from where:  [facility] on 03/14/2021  Attempts:  1st Attempt  Reason for unsuccessful TCM follow-up call:  Unable to reach patient at [phone]. Left VM to call this nurse or Erskine Squibb RN CM   Pt has scheduled appt with Dr Alvis Lemmings on 04/09/2021

## 2021-03-17 ENCOUNTER — Telehealth: Payer: Self-pay

## 2021-03-17 NOTE — Telephone Encounter (Signed)
Transition Care Management Unsuccessful Follow-up Telephone Call  Date of discharge and from where:  03/14/2021, [facility]   Attempts:  2nd Attempt  Reason for unsuccessful TCM follow-up call:  Left voice message on # [phone].  This is the phone number for the patient and daughter, [name].  DPR on file to speak with [name].   Patient has follow up appointment scheduled with Dr Alvis Lemmings 04/09/2021,  Can discuss scheduling an appointment with another provider or at another community care clinic to be seen sooner if patient/ daughter wishes.

## 2021-03-18 ENCOUNTER — Telehealth: Payer: Self-pay

## 2021-03-18 NOTE — Telephone Encounter (Signed)
Transition Care Management Follow-up Telephone Call  Call completed with patient's daughter, [name]   Date of discharge and from where: 03/14/2021, [facility]   How have you been since you were released from the hospital? [name] stated that her mother is weak but doing okay  Any questions or concerns? Yes - she wanted to know if her mother will need to be on keppra the rest of her life. Instructed her to discuss this with the neurologist  The patient only has Medicare A/B , no drug coverage and no medicaid.  Income about $1033/month.   [name] said that they have been able to afford the medications.  She has also contacted DSS about medicaid but needs to produce a document that shows proof of burial plot that was paid for about 50 years ago. This is needed to complete the application for medicaid.  She is not even sure that her mother will qualify.  Provided her with the phone number for [facility] [employer] [facility] [place] to contact regarding medicare supplements and medicare savings program/extra help that the patient may qualify for.   Items Reviewed:  Did the pt receive and understand the discharge instructions provided? Yes   Medications obtained and verified? Yes  - [name] said that they have all medications and she manges the medications for her mother.  She did not have any questions about the current med regime.   Other? No   Any new allergies since your discharge? No   Do you have support at home? Yes  - she lives with her daughter and her family  Home Care and Equipment/Supplies: Were home health services ordered? no If so, what is the name of the agency? n/a  Has the agency set up a time to come to the patient's home? not applicable Were any new equipment or medical supplies ordered?  No What is the name of the medical supply agency? n/a Were you able to get the supplies/equipment? n/a Do you have any questions related to the use of the equipment or supplies? No    [name] said that her mother has been using a cane.  She does not have a RW.   She has a glucometer and [name] checks her mother's blood sugar three times a week.  Functional Questionnaire: (I = Independent and D = Dependent) ADLs: [name] provides assistance as needed.  Follow up appointments reviewed:   PCP Hospital f/u appt confirmed? Yes  - Dr Alvis Lemmings 04/09/2021. Instructed her to call the clinic if she would like to have her mother seen sooner. She wanted to leave the appointment at 04/09/2021 at this time  [facility] f/u appt confirmed? No , [name] left a message for the neurologist. She needs to schedule an appointment for her mother   Are transportation arrangements needed? No   If their condition worsens, is the pt aware to call PCP or go to the Emergency Dept.? Yes  Was the patient provided with contact information for the PCP's office or ED? Yes  Was to pt encouraged to call back with questions or concerns? Yes

## 2021-03-18 NOTE — Telephone Encounter (Signed)
From the discharge call which was completed with patient's daughter, [name].  Patient has appointment with Dr Alvis Lemmings 04/09/2021. Instructed her to call the clinic if she would like to have her mother seen sooner. She wanted to leave the appointment at 04/09/2021 at this time.  She still needs to schedule an appointment with the neurologist.      [name] stated that her mother is weak but doing okay     She wanted to know if her mother will need to be on keppra the rest of her life. Instructed her to discuss this with the neurologist   The patient only has Medicare A/B , no drug coverage and no medicaid.  Income about $1033/month.    [name] said that they have been able to afford the medications.  She has also contacted DSS about medicaid but needs to produce a document that shows proof of burial plot that was paid for about 50 years ago. This is needed to complete the application for medicaid.  She is not even sure that her mother will qualify.   Provided her with the phone number for [facility] [employer] [facility] [place] to contact regarding medicare supplements and medicare savings program/extra help that the patient may qualify for.    [name] said that they have all medications and she manges the medications for her mother.  She did not have any questions about the current med regime.   [name] said that her mother has been using a cane.  She does not have a RW.     She has a glucometer and [name] checks her mother's blood sugar three times a week.

## 2021-03-25 LAB — VITAMIN B1: Vitamin B1 (Thiamine): 126 nmol/L (ref 66.5–200.0)

## 2021-03-27 ENCOUNTER — Other Ambulatory Visit: Payer: Self-pay

## 2021-04-09 ENCOUNTER — Other Ambulatory Visit: Payer: Self-pay

## 2021-04-09 ENCOUNTER — Encounter: Payer: Self-pay | Admitting: Family Medicine

## 2021-04-09 ENCOUNTER — Ambulatory Visit: Payer: Medicare Other | Attending: Family Medicine | Admitting: Family Medicine

## 2021-04-09 ENCOUNTER — Encounter (INDEPENDENT_AMBULATORY_CARE_PROVIDER_SITE_OTHER): Payer: Self-pay

## 2021-04-09 VITALS — BP 130/82 | HR 71 | Ht 63.0 in | Wt 187.2 lb

## 2021-04-09 DIAGNOSIS — R269 Unspecified abnormalities of gait and mobility: Secondary | ICD-10-CM

## 2021-04-09 DIAGNOSIS — Z8673 Personal history of transient ischemic attack (TIA), and cerebral infarction without residual deficits: Secondary | ICD-10-CM

## 2021-04-09 DIAGNOSIS — E1169 Type 2 diabetes mellitus with other specified complication: Secondary | ICD-10-CM

## 2021-04-09 DIAGNOSIS — E1159 Type 2 diabetes mellitus with other circulatory complications: Secondary | ICD-10-CM | POA: Diagnosis not present

## 2021-04-09 DIAGNOSIS — I152 Hypertension secondary to endocrine disorders: Secondary | ICD-10-CM

## 2021-04-09 LAB — GLUCOSE, POCT (MANUAL RESULT ENTRY): POC Glucose: 139 mg/dl — AB (ref 70–99)

## 2021-04-09 MED ORDER — DICLOFENAC SODIUM 1 % EX GEL
4.0000 g | Freq: Four times a day (QID) | CUTANEOUS | 1 refills | Status: DC
  Filled 2021-04-09: qty 100, 12d supply, fill #0

## 2021-04-09 MED ORDER — LEVETIRACETAM ER 500 MG PO TB24
500.0000 mg | ORAL_TABLET | Freq: Every day | ORAL | 1 refills | Status: DC
  Filled 2021-04-09: qty 30, 30d supply, fill #0
  Filled 2021-05-18: qty 30, 30d supply, fill #1

## 2021-04-09 NOTE — Patient Instructions (Signed)
Diabetes Mellitus and Nutrition, Adult When you have diabetes, or diabetes mellitus, it is very important to have healthy eating habits because your blood sugar (glucose) levels are greatly affected by what you eat and drink. Eating healthy foods in the right amounts, at about the same times every day, can help you:  Control your blood glucose.  Lower your risk of heart disease.  Improve your blood pressure.  Reach or maintain a healthy weight. What can affect my meal plan? Every person with diabetes is different, and each person has different needs for a meal plan. Your health care provider may recommend that you work with a dietitian to make a meal plan that is best for you. Your meal plan may vary depending on factors such as:  The calories you need.  The medicines you take.  Your weight.  Your blood glucose, blood pressure, and cholesterol levels.  Your activity level.  Other health conditions you have, such as heart or kidney disease. How do carbohydrates affect me? Carbohydrates, also called carbs, affect your blood glucose level more than any other type of food. Eating carbs naturally raises the amount of glucose in your blood. Carb counting is a method for keeping track of how many carbs you eat. Counting carbs is important to keep your blood glucose at a healthy level, especially if you use insulin or take certain oral diabetes medicines. It is important to know how many carbs you can safely have in each meal. This is different for every person. Your dietitian can help you calculate how many carbs you should have at each meal and for each snack. How does alcohol affect me? Alcohol can cause a sudden decrease in blood glucose (hypoglycemia), especially if you use insulin or take certain oral diabetes medicines. Hypoglycemia can be a life-threatening condition. Symptoms of hypoglycemia, such as sleepiness, dizziness, and confusion, are similar to symptoms of having too much  alcohol.  Do not drink alcohol if: ? Your health care provider tells you not to drink. ? You are pregnant, may be pregnant, or are planning to become pregnant.  If you drink alcohol: ? Do not drink on an empty stomach. ? Limit how much you use to:  0-1 drink a day for women.  0-2 drinks a day for men. ? Be aware of how much alcohol is in your drink. In the [place] one drink equals one 12 oz bottle of beer (355 mL), one 5 oz glass of wine (148 mL), or one 1 oz glass of hard liquor (44 mL). ? Keep yourself hydrated with water, diet soda, or unsweetened iced tea.  Keep in mind that regular soda, juice, and other mixers may contain a lot of sugar and must be counted as carbs. What are tips for following this plan? Reading food labels  Start by checking the serving size on the "Nutrition Facts" label of packaged foods and drinks. The amount of calories, carbs, fats, and other nutrients listed on the label is based on one serving of the item. Many items contain more than one serving per package.  Check the total grams (g) of carbs in one serving. You can calculate the number of servings of carbs in one serving by dividing the total carbs by 15. For example, if a food has 30 g of total carbs per serving, it would be equal to 2 servings of carbs.  Check the number of grams (g) of saturated fats and trans fats in one serving. Choose foods that have   a low amount or none of these fats.  Check the number of milligrams (mg) of salt (sodium) in one serving. Most people should limit total sodium intake to less than 2,300 mg per day.  Always check the nutrition information of foods labeled as "low-fat" or "nonfat." These foods may be higher in added sugar or refined carbs and should be avoided.  Talk to your dietitian to identify your daily goals for nutrients listed on the label. Shopping  Avoid buying canned, pre-made, or processed foods. These foods tend to be high in fat, sodium, and added  sugar.  Shop around the outside edge of the grocery store. This is where you will most often find fresh fruits and vegetables, bulk grains, fresh meats, and fresh dairy. Cooking  Use low-heat cooking methods, such as baking, instead of high-heat cooking methods like deep frying.  Cook using healthy oils, such as olive, canola, or sunflower oil.  Avoid cooking with butter, cream, or high-fat meats. Meal planning  Eat meals and snacks regularly, preferably at the same times every day. Avoid going long periods of time without eating.  Eat foods that are high in fiber, such as fresh fruits, vegetables, beans, and whole grains. Talk with your dietitian about how many servings of carbs you can eat at each meal.  Eat 4-6 oz (112-168 g) of lean protein each day, such as lean meat, chicken, fish, eggs, or tofu. One ounce (oz) of lean protein is equal to: ? 1 oz (28 g) of meat, chicken, or fish. ? 1 egg. ?  cup (62 g) of tofu.  Eat some foods each day that contain healthy fats, such as avocado, nuts, seeds, and fish.   What foods should I eat? Fruits Berries. Apples. Oranges. Peaches. Apricots. Plums. Grapes. Mango. Papaya. Pomegranate. Kiwi. Cherries. Vegetables Lettuce. Spinach. Leafy greens, including kale, chard, collard greens, and mustard greens. Beets. Cauliflower. Cabbage. Broccoli. Carrots. Green beans. Tomatoes. Peppers. Onions. Cucumbers. Brussels sprouts. Grains Whole grains, such as whole-wheat or whole-grain bread, crackers, tortillas, cereal, and pasta. Unsweetened oatmeal. Quinoa. Brown or wild rice. Meats and other proteins Seafood. Poultry without skin. Lean cuts of poultry and beef. Tofu. Nuts. Seeds. Dairy Low-fat or fat-free dairy products such as milk, yogurt, and cheese. The items listed above may not be a complete list of foods and beverages you can eat. Contact a dietitian for more information. What foods should I avoid? Fruits Fruits canned with  syrup. Vegetables Canned vegetables. Frozen vegetables with butter or cream sauce. Grains Refined white flour and flour products such as bread, pasta, snack foods, and cereals. Avoid all processed foods. Meats and other proteins Fatty cuts of meat. Poultry with skin. Breaded or fried meats. Processed meat. Avoid saturated fats. Dairy Full-fat yogurt, cheese, or milk. Beverages Sweetened drinks, such as soda or iced tea. The items listed above may not be a complete list of foods and beverages you should avoid. Contact a dietitian for more information. Questions to ask a health care provider  Do I need to meet with a diabetes educator?  Do I need to meet with a dietitian?  What number can I call if I have questions?  When are the best times to check my blood glucose? Where to find more information:  American Diabetes Association: diabetes.org  Academy of Nutrition and Dietetics: www.eatright.org  National Institute of Diabetes and Digestive and Kidney Diseases: www.niddk.nih.gov  Association of Diabetes Care and Education Specialists: www.diabeteseducator.org Summary  It is important to have healthy eating   habits because your blood sugar (glucose) levels are greatly affected by what you eat and drink.  A healthy meal plan will help you control your blood glucose and maintain a healthy lifestyle.  Your health care provider may recommend that you work with a dietitian to make a meal plan that is best for you.  Keep in mind that carbohydrates (carbs) and alcohol have immediate effects on your blood glucose levels. It is important to count carbs and to use alcohol carefully. This information is not intended to replace advice given to you by your health care provider. Make sure you discuss any questions you have with your health care provider. Document Revised: 10/16/2019 Document Reviewed: 10/16/2019 Elsevier Patient Education  2021 [employer].  

## 2021-04-09 NOTE — Progress Notes (Signed)
Discuss keppra medication. Need medication refill.

## 2021-04-09 NOTE — Progress Notes (Signed)
Subjective:  Patient ID: [name], female    DOB: 1946/06/11  Age: 75 y.o. MRN: [id number]  CC: Hospitalization Follow-up   HPI [name] is a 75 year old female with a history of type 2 diabetes mellitus (8.4), hypertension, previous multiple CVAs here for follow-up visit after hospitalization at [facility] last month for altered mental status after she presented with worsening expressive aphasia and questionable seizures. CT head revealed: IMPRESSION: There is no acute intracranial hemorrhage or evidence of acute infarction. Multiple chronic infarcts.  No occlusion or hemodynamically significant stenosis in the neck.  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  Medium and small branch anterior circulation atherosclerosis.  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  No seizures or epileptiform discharges were seen throughout the recording.   She was commenced on Keppra and discharged to follow-up with neurology.  Interval History: Appointment with neurology comes up on 06/04/2021 Daughter states speech has improved as well as her gait. She never got therapy at discharge but family has been working with her regarding home exercises.  She has not had any seizure episodes and daughter is wondering why patient is on Keppra as there was no clarity regarding a diagnosis of seizures during hospitalization. Patient states she is doing well and denies presence of weakness.  She is right-handed. Daughter states patient does walk with a limp and is requesting a prescription for Voltaren gel for her hip. Past Medical History:  Diagnosis Date  . Diabetes mellitus   . H/O: CVA (cerebrovascular accident) 07/08/2014  . Hypertension   . Stroke [facility])     Past Surgical History:  Procedure Laterality Date  . NO PAST  SURGERIES      Family History  Problem Relation Age of Onset  . Hyperlipidemia Mother   . Hypertension Mother     Allergies  Allergen Reactions  . Penicillins Itching  . Sulfa Antibiotics Other (See Comments)    Reaction not recalled, but patient was told she was allergic    Outpatient Medications Prior to Visit  Medication Sig Dispense Refill  . acetaminophen (TYLENOL) 325 MG tablet Take 650 mg by mouth every 6 (six) hours as needed for pain.    Marland Kitchen aspirin 81 MG chewable tablet Chew 81 mg by mouth in the morning.    Marland Kitchen atenolol (TENORMIN) 25 MG tablet TAKE 1 TABLET (25 MG TOTAL) BY MOUTH DAILY. (Patient taking differently: Take 25 mg by mouth daily.) 30 tablet 3  . atorvastatin (LIPITOR) 10 MG tablet TAKE 1 TABLET (10 MG TOTAL) BY MOUTH DAILY. (Patient taking differently: Take 10 mg by mouth daily.) 30 tablet 3  . Blood Glucose Monitoring Suppl (ACCU-CHEK AVIVA) device Use as instructed daily. 1 each 0  . clopidogrel (PLAVIX) 75 MG tablet TAKE 1 TABLET (75 MG TOTAL) BY MOUTH DAILY. (Patient taking differently: Take 75 mg by mouth daily.) 30 tablet 3  . cyanocobalamin 1000 MCG tablet Take 1 tablet (1,000 mcg total) by mouth daily. 30 tablet 0  . glucose blood (TRUE METRIX BLOOD GLUCOSE TEST) test strip Use as instructed 100 each 6  . Lancets (ACCU-CHEK MULTICLIX) lancets Use as instructed 100 each 12  . lisinopril (ZESTRIL) 10 MG tablet TAKE 1 TABLET (10 MG TOTAL) BY MOUTH DAILY. (Patient taking differently: Take 10 mg by mouth daily.) 90 tablet 3  . metFORMIN (GLUCOPHAGE) 500 MG tablet TAKE 2 TABLETS (1,000  MG TOTAL) BY MOUTH 2 (TWO) TIMES DAILY WITH A MEAL. (Patient taking differently: Take 500 mg by mouth 2 (two) times daily with a meal.) 120 tablet 3  . traMADol (ULTRAM) 50 MG tablet TK 1 T PO  D PRN  2  . levETIRAcetam (KEPPRA XR) 500 MG 24 hr tablet Take 1 tablet (500 mg total) by mouth at bedtime. 30 tablet 0  . VOLTAREN 1 % GEL Apply 4 g topically 4 (four) times daily as needed  (to affected areas- for pain).  5   No facility-administered medications prior to visit.     ROS Review of Systems  Constitutional: Negative for activity change, appetite change and fatigue.  HENT: Negative for congestion, sinus pressure and sore throat.   Eyes: Negative for visual disturbance.  Respiratory: Negative for cough, chest tightness, shortness of breath and wheezing.   Cardiovascular: Negative for chest pain and palpitations.  Gastrointestinal: Negative for abdominal distention, abdominal pain and constipation.  Endocrine: Negative for polydipsia.  Genitourinary: Negative for dysuria and frequency.  Musculoskeletal: Positive for gait problem. Negative for arthralgias and back pain.  Skin: Negative for rash.  Neurological: Negative for tremors, light-headedness and numbness.  Hematological: Does not bruise/bleed easily.  Psychiatric/Behavioral: Negative for agitation and behavioral problems.    Objective:  BP 130/82   Pulse 71   Ht 5\' 3"  (1.6 m)   Wt 187 lb 3.2 oz (84.9 kg)   LMP 09/28/2014 (Approximate)   SpO2 98%   BMI 33.16 kg/m   BP/Weight 04/09/2021 03/14/2021 03/12/2021  Systolic BP 130 155 -  Diastolic BP 82 68 -  Wt. (Lbs) 187.2 - 185.19  BMI 33.16 - 32.8      Physical Exam Constitutional:      Appearance: She is well-developed.  Neck:     Vascular: No JVD.  Cardiovascular:     Rate and Rhythm: Normal rate.     Heart sounds: Normal heart sounds. No murmur heard.   Pulmonary:     Effort: Pulmonary effort is normal.     Breath sounds: Normal breath sounds. No wheezing or rales.  Chest:     Chest wall: No tenderness.  Abdominal:     General: Bowel sounds are normal. There is no distension.     Palpations: Abdomen is soft. There is no mass.     Tenderness: There is no abdominal tenderness.  Musculoskeletal:        General: Normal range of motion.     Right lower leg: No edema.     Left lower leg: No edema.  Neurological:     Mental Status:  She is alert and oriented to person, place, and time.     Gait: Gait abnormal.     Comments: Strength: 4/5 in all extremities  Psychiatric:        Mood and Affect: Mood normal.     CMP Latest Ref Rng & Units 03/14/2021 03/13/2021 03/12/2021  Glucose 70 - 99 mg/dL 865(H) 846(N) 629(B)  BUN 8 - 23 mg/dL 9 11 12   Creatinine 0.44 - 1.00 mg/dL 2.84 1.32 4.40  Sodium 135 - 145 mmol/L 139 140 138  Potassium 3.5 - 5.1 mmol/L 4.1 4.1 4.8  Chloride 98 - 111 mmol/L 108 109 106  CO2 22 - 32 mmol/L 22 25 -  Calcium 8.9 - 10.3 mg/dL 1.0(U) 7.2(Z) -  Total Protein 6.5 - 8.1 g/dL - - -  Total Bilirubin 0.3 - 1.2 mg/dL - - -  Alkaline Phos 38 -  126 U/L - - -  AST 15 - 41 U/L - - -  ALT 0 - 44 U/L - - -    Lipid Panel     Component Value Date/Time   CHOL 132 03/13/2021 0456   TRIG 105 03/13/2021 0456   HDL 41 03/13/2021 0456   CHOLHDL 3.2 03/13/2021 0456   VLDL 21 03/13/2021 0456   LDLCALC 70 03/13/2021 0456    CBC    Component Value Date/Time   WBC 8.5 03/13/2021 0756   RBC 4.71 03/13/2021 0756   HGB 12.8 03/13/2021 0756   HGB 13.9 01/01/2021 1647   HCT 42.3 03/13/2021 0756   HCT 42.2 01/01/2021 1647   PLT 271 03/13/2021 0756   PLT 331 01/01/2021 1647   MCV 89.8 03/13/2021 0756   MCV 84 01/01/2021 1647   MCH 27.2 03/13/2021 0756   MCHC 30.3 03/13/2021 0756   RDW 14.4 03/13/2021 0756   RDW 14.1 01/01/2021 1647   LYMPHSABS 2.8 03/13/2021 0756   LYMPHSABS 3.1 01/01/2021 1647   MONOABS 0.5 03/13/2021 0756   EOSABS 0.1 03/13/2021 0756   EOSABS 0.1 01/01/2021 1647   BASOSABS 0.0 03/13/2021 0756   BASOSABS 0.1 01/01/2021 1647    Lab Results  Component Value Date   HGBA1C 8.4 (H) 03/13/2021    Assessment & Plan:  1. Type 2 diabetes mellitus with other specified complication, without long-term current use of insulin (HCC) Uncontrolled with A1c of 8.4; goal is less than 7.3 Would love to add Marcelline Deist however patient's daughter states metformin dose was recently increased. I have  reviewed her chart and in 12/2020 she was on the same dose of metformin as she is on now her A1c from 02/2021 was 8.4 Next A1c is due in 05/2021 after which her regimen will be adjusted if indicated; I am holding off on changes at this time.  Daughter's request. Counseled on Diabetic diet, my plate method, 664 minutes of moderate intensity exercise/week Blood sugar logs with fasting goals of 80-120 mg/dl, random of less than 403 and in the event of sugars less than 60 mg/dl or greater than 474 mg/dl encouraged to notify the clinic. Advised on the need for annual eye exams, annual foot exams, Pneumonia vaccine. - POCT glucose (manual entry)  2. Hypertension complicating diabetes (HCC) Controlled Continue current regimen Counseled on blood pressure goal of less than 130/80, low-sodium, DASH diet, medication compliance, 150 minutes of moderate intensity exercise per week. Discussed medication compliance, adverse effects.  3. H/O: CVA (cerebrovascular accident) Questionable underlying seizure during hospitalization Advised to comply with Keppra until appointment with neurology for further instructions Risk factor modification - levETIRAcetam (KEPPRA XR) 500 MG 24 hr tablet; Take 1 tablet (500 mg total) by mouth at bedtime.  Dispense: 30 tablet; Refill: 1 - Ambulatory referral to Home Health  4. Gait abnormality Secondary to multiple previous CVAs She will benefit from PT and speech therapy - Ambulatory referral to Home Health - diclofenac Sodium (VOLTAREN) 1 % GEL; Apply 4 g topically 4 (four) times daily.  Dispense: 100 g; Refill: 1    Meds ordered this encounter  Medications  . levETIRAcetam (KEPPRA XR) 500 MG 24 hr tablet    Sig: Take 1 tablet (500 mg total) by mouth at bedtime.    Dispense:  30 tablet    Refill:  1  . diclofenac Sodium (VOLTAREN) 1 % GEL    Sig: Apply 4 g topically 4 (four) times daily.    Dispense:  100 g  Refill:  1    Follow-up: Return in about 9 weeks  (around 06/11/2021) for medical conditions.       Hoy Register, MD, FAAFP. [facility] and [facility] [place], [place] [phone]   04/09/2021, 1:12 PM

## 2021-04-15 ENCOUNTER — Other Ambulatory Visit: Payer: Self-pay

## 2021-04-15 ENCOUNTER — Telehealth: Payer: Self-pay

## 2021-04-15 MED FILL — Lisinopril Tab 10 MG: ORAL | 30 days supply | Qty: 30 | Fill #1 | Status: AC

## 2021-04-15 MED FILL — Atorvastatin Calcium Tab 10 MG (Base Equivalent): ORAL | 30 days supply | Qty: 30 | Fill #1 | Status: AC

## 2021-04-15 MED FILL — Clopidogrel Bisulfate Tab 75 MG (Base Equiv): ORAL | 30 days supply | Qty: 30 | Fill #1 | Status: AC

## 2021-04-15 MED FILL — Atenolol Tab 25 MG: ORAL | 30 days supply | Qty: 30 | Fill #1 | Status: AC

## 2021-04-15 NOTE — Telephone Encounter (Signed)
Referral received for home health services, call placed to patient's daughter, [name], # [phone] to inquire if she has a preference for home health agencies.  Message left with call back requested to this CM.

## 2021-04-16 NOTE — Telephone Encounter (Signed)
Attempted to contact patient's daughter  [name], # [phone] again to inquire if she has a preference for home health agencies.  Message left with call back requested to this CM.

## 2021-04-23 ENCOUNTER — Other Ambulatory Visit: Payer: Self-pay

## 2021-04-23 MED FILL — Metformin HCl Tab 500 MG: ORAL | 30 days supply | Qty: 120 | Fill #1 | Status: AC

## 2021-04-24 ENCOUNTER — Other Ambulatory Visit: Payer: Self-pay

## 2021-04-28 NOTE — Telephone Encounter (Signed)
Attempted again  to contact patient's daughter  [name], # [phone] to inquire if she has a preference for home health agencies. Message left with call back requested to this CM.

## 2021-05-18 ENCOUNTER — Other Ambulatory Visit: Payer: Self-pay

## 2021-05-18 ENCOUNTER — Other Ambulatory Visit: Payer: Self-pay | Admitting: Physician Assistant

## 2021-05-18 DIAGNOSIS — Z8673 Personal history of transient ischemic attack (TIA), and cerebral infarction without residual deficits: Secondary | ICD-10-CM

## 2021-05-18 DIAGNOSIS — I10 Essential (primary) hypertension: Secondary | ICD-10-CM

## 2021-05-18 MED ORDER — CLOPIDOGREL BISULFATE 75 MG PO TABS
ORAL_TABLET | Freq: Every day | ORAL | 3 refills | Status: DC
  Filled 2021-05-18: qty 30, 30d supply, fill #0
  Filled 2021-06-29: qty 30, 30d supply, fill #1
  Filled 2021-07-29: qty 30, 30d supply, fill #2
  Filled 2021-09-02: qty 30, 30d supply, fill #3

## 2021-05-18 MED ORDER — ATENOLOL 25 MG PO TABS
ORAL_TABLET | Freq: Every day | ORAL | 3 refills | Status: DC
  Filled 2021-05-18: qty 30, 30d supply, fill #0
  Filled 2021-07-03: qty 30, 30d supply, fill #1
  Filled 2021-07-29: qty 30, 30d supply, fill #2
  Filled 2021-09-02: qty 30, 30d supply, fill #3

## 2021-05-18 MED FILL — Lisinopril Tab 10 MG: ORAL | 30 days supply | Qty: 30 | Fill #2 | Status: AC

## 2021-05-18 MED FILL — Atorvastatin Calcium Tab 10 MG (Base Equivalent): ORAL | 30 days supply | Qty: 30 | Fill #2 | Status: AC

## 2021-05-19 ENCOUNTER — Other Ambulatory Visit: Payer: Self-pay

## 2021-05-20 ENCOUNTER — Other Ambulatory Visit: Payer: Self-pay

## 2021-05-28 ENCOUNTER — Other Ambulatory Visit: Payer: Self-pay | Admitting: Physician Assistant

## 2021-05-28 ENCOUNTER — Other Ambulatory Visit: Payer: Self-pay

## 2021-05-28 DIAGNOSIS — E1169 Type 2 diabetes mellitus with other specified complication: Secondary | ICD-10-CM

## 2021-05-28 MED ORDER — METFORMIN HCL 500 MG PO TABS
ORAL_TABLET | Freq: Two times a day (BID) | ORAL | 1 refills | Status: DC
  Filled 2021-05-28: qty 120, 30d supply, fill #0
  Filled 2021-07-03: qty 120, 30d supply, fill #1

## 2021-06-01 ENCOUNTER — Other Ambulatory Visit: Payer: Self-pay

## 2021-06-02 ENCOUNTER — Ambulatory Visit: Payer: Self-pay

## 2021-06-02 NOTE — Telephone Encounter (Signed)
Answer Assessment - Initial Assessment Questions 1. APPEARANCE of BLOOD: "What color is it?" "Is it passed separately, on the surface of the stool, or mixed in with the stool?"      Bright red on paper 2. AMOUNT: "How much blood was passed?"      Small 3. FREQUENCY: "How many times has blood been passed with the stools?"      3 4. ONSET: "When was the blood first seen in the stools?" (Days or weeks)      This morning 5. DIARRHEA: "Is there also some diarrhea?" If Yes, ask: "How many diarrhea stools in the past 24 hours?"      3 6. CONSTIPATION: "Do you have constipation?" If Yes, ask: "How bad is it?"     No 7. RECURRENT SYMPTOMS: "Have you had blood in your stools before?" If Yes, ask: "When was the last time?" and "What happened that time?"      Yes 8. BLOOD THINNERS: "Do you take any blood thinners?" (e.g., Coumadin/warfarin, Pradaxa/dabigatran, aspirin)     Plavix 9. OTHER SYMPTOMS: "Do you have any other symptoms?"  (e.g., abdomen pain, vomiting, dizziness, fever)     Loose stools 10. PREGNANCY: "Is there any chance you are pregnant?" "When was your last menstrual period?"       No  Protocols used: Rectal Bleeding-A-AH

## 2021-06-02 NOTE — Telephone Encounter (Signed)
Pt.'s daughter reports pt. Has had 3 loose stools today. Saw bright red blood when she wiped. Has "some cramping." Reports pt. Had this years ago and had hemorrhoids. Instructed to go to ED for worsening of symptoms.Requests appointment tomorrow. Please advise.

## 2021-06-02 NOTE — Telephone Encounter (Signed)
Please call and refer to mobile unit

## 2021-06-04 ENCOUNTER — Ambulatory Visit: Payer: Medicare Other | Admitting: Neurology

## 2021-06-17 ENCOUNTER — Telehealth: Payer: Self-pay | Admitting: Family Medicine

## 2021-06-17 NOTE — Telephone Encounter (Signed)
Called to inform pt that appt is not changed to virtual due to provider being out of office LVM

## 2021-06-18 ENCOUNTER — Other Ambulatory Visit: Payer: Self-pay

## 2021-06-18 ENCOUNTER — Encounter: Payer: Self-pay | Admitting: Family Medicine

## 2021-06-18 ENCOUNTER — Ambulatory Visit: Payer: Medicare Other | Attending: Family Medicine | Admitting: Family Medicine

## 2021-06-18 DIAGNOSIS — M160 Bilateral primary osteoarthritis of hip: Secondary | ICD-10-CM | POA: Diagnosis not present

## 2021-06-18 DIAGNOSIS — I10 Essential (primary) hypertension: Secondary | ICD-10-CM | POA: Diagnosis not present

## 2021-06-18 DIAGNOSIS — M169 Osteoarthritis of hip, unspecified: Secondary | ICD-10-CM

## 2021-06-18 DIAGNOSIS — E1169 Type 2 diabetes mellitus with other specified complication: Secondary | ICD-10-CM

## 2021-06-18 DIAGNOSIS — Z8673 Personal history of transient ischemic attack (TIA), and cerebral infarction without residual deficits: Secondary | ICD-10-CM | POA: Diagnosis not present

## 2021-06-18 HISTORY — DX: Osteoarthritis of hip, unspecified: M16.9

## 2021-06-18 MED ORDER — LEVETIRACETAM ER 500 MG PO TB24
500.0000 mg | ORAL_TABLET | Freq: Every day | ORAL | 1 refills | Status: DC
  Filled 2021-06-18: qty 30, 30d supply, fill #0

## 2021-06-18 MED ORDER — ATORVASTATIN CALCIUM 20 MG PO TABS
20.0000 mg | ORAL_TABLET | Freq: Every day | ORAL | 6 refills | Status: DC
  Filled 2021-06-18: qty 30, 30d supply, fill #0
  Filled 2021-07-29: qty 30, 30d supply, fill #1
  Filled 2021-09-02: qty 30, 30d supply, fill #2
  Filled 2021-10-12: qty 30, 30d supply, fill #3
  Filled 2021-11-20: qty 30, 30d supply, fill #4

## 2021-06-18 MED ORDER — TRAMADOL HCL 50 MG PO TABS
50.0000 mg | ORAL_TABLET | Freq: Every evening | ORAL | 1 refills | Status: DC | PRN
  Filled 2021-06-18: qty 30, 30d supply, fill #0
  Filled 2021-09-14: qty 30, 30d supply, fill #1

## 2021-06-18 NOTE — Progress Notes (Signed)
Virtual Visit via Telephone Note  I connected with [name], on 06/18/2021 at 9:23 AM by telephone due to the COVID-19 pandemic and verified that I am speaking with the correct person using two identifiers.   Consent: I discussed the limitations, risks, security and privacy concerns of performing an evaluation and management service by telephone and the availability of in person appointments. I also discussed with the patient that there may be a patient responsible charge related to this service. The patient expressed understanding and agreed to proceed.   Location of Patient: Home  Location of Provider: Clinic   Persons participating in Telemedicine visit: Tamlyn Sides - daughter Dr. Alvis Lemmings     History of Present Illness: [name] is a 76 year old female with a history of type 2 diabetes mellitus (8.4), hypertension, previous multiple CVAs, Hospitalization at [facility] in 02/2021 for altered mental status after she presented with worsening expressive aphasia and questionable seizures.  Commenced on Keppra during that hospitalization. She should have followed up with neurology but had to reschedule her appointment and will not be seen till next month.  She has had no recent seizures and is compliant with her Keppra.   She has no residual weakness and has no abnormality with her gait. At her last visit daughter had complained she walks with a limp but this has resolved. She has pain in both hips from Arthritis and Ibuprofen and pain is worse with the rain.  Was previously on tramadol which she did well on.  Random sugars are 115-142 and she has no hypoglycemic episodes.  Not seen by ophthalmology in over 5 years.  Denies presence of neuropathy, abnormal vision. Past Medical History:  Diagnosis Date   Diabetes mellitus    H/O: CVA (cerebrovascular accident) 07/08/2014   Hypertension    Stroke (HCC)    Allergies  Allergen Reactions    Penicillins Itching   Sulfa Antibiotics Other (See Comments)    Reaction not recalled, but patient was told she was allergic    Current Outpatient Medications on File Prior to Visit  Medication Sig Dispense Refill   acetaminophen (TYLENOL) 325 MG tablet Take 650 mg by mouth every 6 (six) hours as needed for pain.     aspirin 81 MG chewable tablet Chew 81 mg by mouth in the morning.     atenolol (TENORMIN) 25 MG tablet Take1 tablet by mouth daily. 30 tablet 3   atorvastatin (LIPITOR) 10 MG tablet TAKE 1 TABLET (10 MG TOTAL) BY MOUTH DAILY. (Patient taking differently: Take 10 mg by mouth daily.) 30 tablet 3   Blood Glucose Monitoring Suppl (ACCU-CHEK AVIVA) device Use as instructed daily. 1 each 0   clopidogrel (PLAVIX) 75 MG tablet Take1 tablet by mouth daily. 30 tablet 3   diclofenac Sodium (VOLTAREN) 1 % GEL Apply 4 g topically 4 (four) times daily. 100 g 1   glucose blood (TRUE METRIX BLOOD GLUCOSE TEST) test strip Use as instructed 100 each 6   Lancets (ACCU-CHEK MULTICLIX) lancets Use as instructed 100 each 12   levETIRAcetam (KEPPRA XR) 500 MG 24 hr tablet Take 1 tablet (500 mg total) by mouth at bedtime. 30 tablet 1   lisinopril (ZESTRIL) 10 MG tablet TAKE 1 TABLET (10 MG TOTAL) BY MOUTH DAILY. (Patient taking differently: Take 10 mg by mouth daily.) 90 tablet 3   metFORMIN (GLUCOPHAGE) 500 MG tablet TAKE 2 TABLETS (1,000 MG TOTAL) BY MOUTH 2 (TWO) TIMES DAILY WITH A MEAL.  120 tablet 1   traMADol (ULTRAM) 50 MG tablet TK 1 T PO  D PRN  2   No current facility-administered medications on file prior to visit.    ROS: See HPI  Observations/Objective: Awake, alert, and attempt to Not in acute distress Normal mood   Lab Results  Component Value Date   HGBA1C 8.4 (H) 03/13/2021    CMP Latest Ref Rng & Units 03/14/2021 03/13/2021 03/12/2021  Glucose 70 - 99 mg/dL 938(H) 829(H) 371(I)  BUN 8 - 23 mg/dL 9 11 12   Creatinine 0.44 - 1.00 mg/dL 9.67 8.93  Sodium 135 - 145 mmol/L  139 140 138  Potassium 3.5 - 5.1 mmol/L 4.1 4.1 4.8  Chloride 98 - 111 mmol/L 108 109 106  CO2 22 - 32 mmol/L 22 25 -  Calcium 8.9 - 10.3 mg/dL 8.10) 1.7(P) -  Total Protein 6.5 - 8.1 g/dL - - -  Total Bilirubin 0.3 - 1.2 mg/dL - - -  Alkaline Phos 38 - 126 U/L - - -  AST 15 - 41 U/L - - -  ALT 0 - 44 U/L - - -    Lipid Panel     Component Value Date/Time   CHOL 132 03/13/2021 0456   TRIG 105 03/13/2021 0456   HDL 41 03/13/2021 0456   CHOLHDL 3.2 03/13/2021 0456   VLDL 21 03/13/2021 0456   LDLCALC 70 03/13/2021 0456    Assessment and Plan: 1. Type 2 diabetes mellitus with other specified complication, without long-term current use of insulin (HCC) Uncontrolled with A1c of 8.4; goal is less than 7.5 due to age Blood sugar log reveals improvement No regimen change but will check A1c at next visit Counseled on Diabetic diet, my plate method, 03/15/2021 minutes of moderate intensity exercise/week Blood sugar logs with fasting goals of 80-120 mg/dl, random of less than 585 and in the event of sugars less than 60 mg/dl or greater than 277 mg/dl encouraged to notify the clinic. Advised on the need for annual eye exams, annual foot exams, Pneumonia vaccine. - atorvastatin (LIPITOR) 20 MG tablet; Take 1 tablet (20 mg total) by mouth daily.  Dispense: 30 tablet; Refill: 6 - Ambulatory referral to Ophthalmology  2. H/O: CVA (cerebrovascular accident) No residual deficit She did have an underlying actionable seizure at her last hospitalization 3 months ago Advised to keep appointment with neurology - atorvastatin (LIPITOR) 20 MG tablet; Take 1 tablet (20 mg total) by mouth daily.  Dispense: 30 tablet; Refill: 6 - levETIRAcetam (KEPPRA XR) 500 MG 24 hr tablet; Take 1 tablet (500 mg total) by mouth at bedtime.  Dispense: 30 tablet; Refill: 1  3. Primary osteoarthritis of both hips Uncontrolled and NSAID Will prescribe tramadol to be used as needed - traMADol (ULTRAM) 50 MG tablet; Take 1  tablet (50 mg total) by mouth at bedtime as needed. For chronic bilateral osteoarthritis  Dispense: 30 tablet; Refill: 1  4. Essential hypertension Stable Continue antihypertensive   Follow Up Instructions: 3 months   I discussed the assessment and treatment plan with the patient. The patient was provided an opportunity to ask questions and all were answered. The patient agreed with the plan and demonstrated an understanding of the instructions.   The patient was advised to call back or seek an in-person evaluation if the symptoms worsen or if the condition fails to improve as anticipated.     I provided 15 minutes total of non-face-to-face time during this encounter.   Rahkim Rabalais,  MD, FAAFP. [facility] and [facility]ness [place], [place] [phone]   06/18/2021, 9:23 AM

## 2021-06-29 ENCOUNTER — Other Ambulatory Visit: Payer: Self-pay

## 2021-06-29 MED FILL — Lisinopril Tab 10 MG: ORAL | 30 days supply | Qty: 30 | Fill #3 | Status: AC

## 2021-07-03 ENCOUNTER — Telehealth: Payer: Self-pay | Admitting: Family Medicine

## 2021-07-03 ENCOUNTER — Other Ambulatory Visit: Payer: Self-pay

## 2021-07-03 NOTE — Telephone Encounter (Signed)
Patient need to see Dr. Alvis Lemmings in October for Diabetes f/u. Called patient and left vm to call [phone] to get scheduled.

## 2021-07-03 NOTE — Telephone Encounter (Signed)
-----   Message from Hoy Register, MD sent at 06/18/2021  9:49 AM EDT ----- Regarding: 3 month appt Good morning, Please schedule for 57-month follow-up for diabetes. Thanks, EN

## 2021-07-14 ENCOUNTER — Other Ambulatory Visit: Payer: Self-pay

## 2021-07-14 ENCOUNTER — Encounter: Payer: Self-pay | Admitting: Neurology

## 2021-07-14 ENCOUNTER — Ambulatory Visit (INDEPENDENT_AMBULATORY_CARE_PROVIDER_SITE_OTHER): Payer: Medicare Other | Admitting: Neurology

## 2021-07-14 VITALS — BP 158/73 | HR 65 | Ht 63.0 in | Wt 181.5 lb

## 2021-07-14 DIAGNOSIS — Z8673 Personal history of transient ischemic attack (TIA), and cerebral infarction without residual deficits: Secondary | ICD-10-CM

## 2021-07-14 DIAGNOSIS — G40009 Localization-related (focal) (partial) idiopathic epilepsy and epileptic syndromes with seizures of localized onset, not intractable, without status epilepticus: Secondary | ICD-10-CM

## 2021-07-14 MED ORDER — LACOSAMIDE 100 MG PO TABS
100.0000 mg | ORAL_TABLET | Freq: Two times a day (BID) | ORAL | 3 refills | Status: DC
  Filled 2021-07-14 – 2021-07-31 (×2): qty 60, 30d supply, fill #0
  Filled 2021-09-02: qty 60, 30d supply, fill #1

## 2021-07-14 NOTE — Patient Instructions (Signed)
You have been prescribed Lacosamide   Week 1: Take 1/2 tablet twice day and continue with Keppra   Week 2: Take 1 tab twice a day and continue with Keppra   Week 3: Discontinue Keppra and continue with Lacosamide  Return to clinic in 3 months, at that time, we will do blood work      Per [employer], patients with seizures are not allowed to drive until they have been seizure-free for six months.  Other recommendations include using caution when using heavy equipment or power tools. Avoid working on ladders or at heights. Take showers instead of baths.  Do not swim alone.  Ensure the water temperature is not too high on the home water heater. Do not go swimming alone. Do not lock yourself in a room alone (i.e. bathroom). When caring for infants or small children, sit down when holding, feeding, or changing them to minimize risk of injury to the child in the event you have a seizure. Maintain good sleep hygiene. Avoid alcohol.  Also recommend adequate sleep, hydration, good diet and minimize stress.   During the Seizure  - First, ensure adequate ventilation and place patients on the floor on their left side  Loosen clothing around the neck and ensure the airway is patent. If the patient is clenching the teeth, do not force the mouth open with any object as this can cause severe damage - Remove all items from the surrounding that can be hazardous. The patient may be oblivious to what's happening and may not even know what he or she is doing. If the patient is confused and wandering, either gently guide him/her away and block access to outside areas - Reassure the individual and be comforting - Call 911. In most cases, the seizure ends before EMS arrives. However, there are cases when seizures may last over 3 to 5 minutes. Or the individual may have developed breathing difficulties or severe injuries. If a pregnant patient or a person with diabetes develops a seizure, it is prudent to  call an ambulance. - Finally, if the patient does not regain full consciousness, then call EMS. Most patients will remain confused for about 45 to 90 minutes after a seizure, so you must use judgment in calling for help. - Avoid restraints but make sure the patient is in a bed with padded side rails - Place the individual in a lateral position with the neck slightly flexed; this will help the saliva drain from the mouth and prevent the tongue from falling backward - Remove all nearby furniture and other hazards from the area - Provide verbal assurance as the individual is regaining consciousness - Provide the patient with privacy if possible - Call for help and start treatment as ordered by the caregiver   After the Seizure (Postictal Stage)  After a seizure, most patients experience confusion, fatigue, muscle pain and/or a headache. Thus, one should permit the individual to sleep. For the next few days, reassurance is essential. Being calm and helping reorient the person is also of importance.  Most seizures are painless and end spontaneously. Seizures are not harmful to others but can lead to complications such as stress on the lungs, brain and the heart. Individuals with prior lung problems may develop labored breathing and respiratory distress.

## 2021-07-14 NOTE — Progress Notes (Signed)
[facility]  PATIENT: [name] DOB: 07-Jul-1946  REFERRING CLINICIAN: Hoy Register, MD HISTORY FROM: Daughter [name]  REASON FOR VISIT: Seizure   HISTORICAL  CHIEF COMPLAINT:  Chief Complaint  Patient presents with   New Patient (Initial Visit)    Room 13 w/ dgt, [name]. Hx of CVA. Referral to r/o seizures. Per dgt, the patient's grandson was home alone w/ her. He heard his grandmother calling for help. Witnessed her feeling off balance ("could not pick up her foot"). She collapsed in his arms. Unresponsive, blank stare, mouth open. The event only lasted a few minutes before she was to speak. She continued to exhibit confusion and was taken to the ED. Occurred 03/12/21. Taking generic Keppra XR , QHS. No further similar events.    HISTORY OF PRESENT ILLNESS:  This is a 75 year old woman with past medical history of strokes, diabetes mellitus type 2 who is presenting after a seizure in April. Daughter at bedside reports patient was outside smoking, then she called for help, grandson who was in the house went to help her, he noticed that she was dragging her left foot, she was not talking and fell off grandson's arms.  He helped her the floor and notice patient had her mouth opened and was staring. Episode lasted a couple minutes, by the time EMS arrived patient episode ended but she was confused and was not talking.  She was taken to the ED where she had a stroke work-up which was negative for acute stroke and had a EEG which showed a epileptogenic area in the left frontal and left temporal region.  She was started on Keppra 500 mg XR .  Daughter mentioned this was the first time the patient had an event like that, since being on Levetiracetam, she has not had any episode similar to the previous one.  But he has been complaining of unstable gait, dizziness, states things are dizzy, daughter also mentioned that patient has some visual hallucination,  seeing two little girls talking to her, sometimes she will see someone talking to her daughter, she also had a complaint of joint pain, daughter is not sure if this is related to her known arthritis or this is a related to the Keppra.  Daughter mentioned the patient is still independent she is able to cook clean, bath and dress herself, she is not driving, she has issues with speech and comprehension but she is able to follow simple commands    Handedness: Right handed   Seizure Type: Unclear, possibly focal seizure  Current frequency: Only once   Any injuries from seizures:   Seizure risk factors: Multiple stroke, no family hx of seizure  Previous ASMs: Levetiracetam   Currenty ASMs: Levetiracetam 500 mg XR  ASMs side effects: Sleepiness, dizziness, visual hallucinations, mood changes   Brain Images: 1. No evidence of acute intracranial abnormality. Specifically, no acute infarct. 2. 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 3. Chronic left M1 occlusion better characterized on same day CTA.  Previous EEGs: rEEG: This study showed evidence of epileptogenicity as well as cortical dysfunction in left frontotemporal region likely secondary to underlying stroke.  No seizures or epileptiform discharges were seen throughout the recording   OTHER MEDICAL CONDITIONS: Multiple strokes, DMII, Dementia  REVIEW OF SYSTEMS: Unable to fully complete as patient has aphasia.   ALLERGIES: Allergies  Allergen Reactions   Penicillins Itching   Sulfa Antibiotics Other (See Comments)    Reaction not recalled,  but patient was told she was allergic    HOME MEDICATIONS: Outpatient Medications Prior to Visit  Medication Sig Dispense Refill   acetaminophen (TYLENOL) 325 MG tablet Take 650 mg by mouth every 6 (six) hours as needed for pain.     aspirin 81 MG chewable tablet Chew 81 mg by mouth in the morning.     atenolol (TENORMIN) 25 MG tablet Take1  tablet by mouth daily. 30 tablet 3   atorvastatin (LIPITOR) 20 MG tablet Take 1 tablet (20 mg total) by mouth daily. 30 tablet 6   Blood Glucose Monitoring Suppl (ACCU-CHEK AVIVA) device Use as instructed daily. 1 each 0   clopidogrel (PLAVIX) 75 MG tablet Take1 tablet by mouth daily. 30 tablet 3   diclofenac Sodium (VOLTAREN) 1 % GEL Apply 4 g topically 4 (four) times daily. 100 g 1   glucose blood (TRUE METRIX BLOOD GLUCOSE TEST) test strip Use as instructed 100 each 6   Lancets (ACCU-CHEK MULTICLIX) lancets Use as instructed 100 each 12   lisinopril (ZESTRIL) 10 MG tablet TAKE 1 TABLET (10 MG TOTAL) BY MOUTH DAILY. (Patient taking differently: Take 10 mg by mouth daily.) 90 tablet 3   metFORMIN (GLUCOPHAGE) 500 MG tablet TAKE 2 TABLETS (1,000 MG TOTAL) BY MOUTH 2 (TWO) TIMES DAILY WITH A MEAL. 120 tablet 1   traMADol (ULTRAM) 50 MG tablet Take 1 tablet (50 mg total) by mouth at bedtime as needed. For chronic bilateral osteoarthritis 30 tablet 1   levETIRAcetam (KEPPRA XR) 500 MG 24 hr tablet Take 1 tablet (500 mg total) by mouth at bedtime. 30 tablet 1   No facility-administered medications prior to visit.    PAST MEDICAL HISTORY: Past Medical History:  Diagnosis Date   Diabetes mellitus    H/O: CVA (cerebrovascular accident) 07/08/2014   Hypertension    Stroke (HCC)     PAST SURGICAL HISTORY: Past Surgical History:  Procedure Laterality Date   NO PAST SURGERIES      FAMILY HISTORY: Family History  Problem Relation Age of Onset   Hyperlipidemia Mother    Hypertension Mother    Diabetes Mother    Stroke Father     SOCIAL HISTORY: Social History   Socioeconomic History   Marital status: Widowed    Spouse name: Not on file   Number of children: 3   Years of education: 12   Highest education level: High school graduate  Occupational History   Occupation: Retired  Tobacco Use   Smoking status: Every Day    Packs/day: 1.00    Years: 48.00    Pack years: 48.00     Types: Cigarettes   Smokeless tobacco: Never  Substance and Sexual Activity   Alcohol use: No   Drug use: No   Sexual activity: Not on file  Other Topics Concern   Not on file  Social History Narrative   Lives with daughter, [name], and her grandchildren.   Right-handed.   Three cups caffeine daily.   Social Determinants of Health   Financial Resource Strain: Not on file  Food Insecurity: Not on file  Transportation Needs: Not on file  Physical Activity: Not on file  Stress: Not on file  Social Connections: Not on file  Intimate Partner Violence: Not on file     PHYSICAL EXAM  GENERAL EXAM/CONSTITUTIONAL: Vitals:  Vitals:   07/14/21 0739  BP: (!) 158/73  Pulse: 65  Weight: 181 lb 8 oz (82.3 kg)  Height: 5\' 3"  (1.6 m)  Body mass index is 32.15 kg/m. Wt Readings from Last 3 Encounters:  07/14/21 181 lb 8 oz (82.3 kg)  04/09/21 187 lb 3.2 oz (84.9 kg)  03/12/21 185 lb 3 oz (84 kg)   Patient is in no distress; well developed, nourished and groomed; neck is supple  CARDIOVASCULAR: Examination of carotid arteries is normal; no carotid bruits Regular rate and rhythm, no murmurs Examination of peripheral vascular system by observation and palpation is normal  EYES: Pupils round and reactive to light, Visual fields full to confrontation, Extraocular movements intacts,   MUSCULOSKELETAL: Gait, strength, tone, movements noted in Neurologic exam below  NEUROLOGIC: MENTAL STATUS: awake, alert, oriented to person,  Unable to provide history  normal attention and concentration language aphasic, able to name simple object, able to follow simple 1 step commands but not 2 step embedded commands   CRANIAL NERVE:  2nd, 3rd, 4th, 6th - pupils equal and reactive to light, visual fields full to confrontation, extraocular muscles intact, no nystagmus 5th - facial sensation symmetric 7th - facial strength symmetric 8th - hearing intact 9th - palate elevates symmetrically,  uvula midline 11th - shoulder shrug symmetric 12th - tongue protrusion midline  MOTOR:  normal bulk and tone, full strength in the BUE, BLE  SENSORY:  normal and symmetric to light touch  COORDINATION:  finger-nose-finger, fine finger movements normal  REFLEXES:  deep tendon reflexes present and symmetric  GAIT/STATION:  Mild hemiplegic gait    DIAGNOSTIC DATA (LABS, IMAGING, TESTING) - I reviewed patient records, labs, notes, testing and imaging myself where available.  Lab Results  Component Value Date   WBC 8.5 03/13/2021   HGB 12.8 03/13/2021   HCT 42.3 03/13/2021   MCV 89.8 03/13/2021   PLT 271 03/13/2021      Component Value Date/Time   NA 139 03/14/2021 0240   NA 142 01/01/2021 1647   K 4.1 03/14/2021 0240   CL 108 03/14/2021 0240   CO2 22 03/14/2021 0240   GLUCOSE 139 (H) 03/14/2021 0240   BUN 9 03/14/2021 0240   BUN 9 01/01/2021 1647   CREATININE 0.93 03/14/2021 0240   CALCIUM 8.4 (L) 03/14/2021 0240   PROT 6.6 03/12/2021 1500   PROT 6.6 01/01/2021 1647   ALBUMIN 3.8 03/12/2021 1500   ALBUMIN 4.2 01/01/2021 1647   AST 20 03/12/2021 1500   ALT 16 03/12/2021 1500   ALKPHOS 64 03/12/2021 1500   BILITOT 0.8 03/12/2021 1500   BILITOT 0.3 01/01/2021 1647   GFRNONAA >60 03/14/2021 0240   GFRAA 65 01/01/2021 1647   Lab Results  Component Value Date   CHOL 132 03/13/2021   HDL 41 03/13/2021   LDLCALC 70 03/13/2021   TRIG 105 03/13/2021   Lab Results  Component Value Date   HGBA1C 8.4 (H) 03/13/2021   Lab Results  Component Value Date   VITAMINB12 69 (L) 03/12/2021   Lab Results  Component Value Date   TSH 0.785 03/12/2021    MRI Brain 4/21/220 1. No evidence of acute intracranial abnormality. Specifically, no acute infarct. 2. 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 3. Chronic left M1 occlusion better characterized on same day CTA.  Routine EEG 03/13/21 This study showed evidence of  epileptogenicity as well as cortical dysfunction in left frontotemporal region likely secondary to underlying stroke.  No seizures or epileptiform discharges were seen throughout the recording  I personally reviewed brain Images and previous EEG reports.   ASSESSMENT AND PLAN  75 y.o.  year old female here with past medical history of multiple strokes, diabetes mellitus type 2 who is presenting after a seizure.  Per history, she was noted to have a episode of unresponsiveness with mouth opening and staring followed by confusion.  In the ED a stroke was ruled out and she had a EEG showing epileptogenic potential in the left frontal and left temporal region.  She was started on levetiracetam XR 500 mg nightly but has been having side effect of dizziness, hallucination, mood swing and sleepiness.  I have explained to daughter Misty DroneBrenda that based on her multiple strokes and seizure she will most likely need anti seizure medication.  We discussed switching levetiracetam to either lacosamide or lamotrigine and after further discussion of side effects, daughter opted for lacosamide.  I will I will switch her from levetiracetam to Lacosamide, we will see her in 3 months for follow-up.  I also discussed return precautions    1. Localization-related idiopathic epilepsy and epileptic syndromes with seizures of localized onset, not intractable, without status epilepticus (HCC)   2. History of multiple strokes      PLAN:  You have been prescribed Lacosamide   Week 1: Take 1/2 tablet twice day and continue with Keppra   Week 2: Take 1 tab twice a day and continue with Keppra   Week 3: Discontinue Keppra and continue with Lacosamide  Return to clinic in 3 months, at that time, we will do blood work   Per [place] DMV statutes, patients with seizures are not allowed to drive until they have been seizure-free for six months.  Other recommendations include using caution when using heavy equipment or power  tools. Avoid working on ladders or at heights. Take showers instead of baths.  Do not swim alone.  Ensure the water temperature is not too high on the home water heater. Do not go swimming alone. Do not lock yourself in a room alone (i.e. bathroom). When caring for infants or small children, sit down when holding, feeding, or changing them to minimize risk of injury to the child in the event you have a seizure. Maintain good sleep hygiene. Avoid alcohol.  Also recommend adequate sleep, hydration, good diet and minimize stress.   During the Seizure  - First, ensure adequate ventilation and place patients on the floor on their left side  Loosen clothing around the neck and ensure the airway is patent. If the patient is clenching the teeth, do not force the mouth open with any object as this can cause severe damage - Remove all items from the surrounding that can be hazardous. The patient may be oblivious to what's happening and may not even know what he or she is doing. If the patient is confused and wandering, either gently guide him/her away and block access to outside areas - Reassure the individual and be comforting - Call 911. In most cases, the seizure ends before EMS arrives. However, there are cases when seizures may last over 3 to 5 minutes. Or the individual may have developed breathing difficulties or severe injuries. If a pregnant patient or a person with diabetes develops a seizure, it is prudent to call an ambulance. - Finally, if the patient does not regain full consciousness, then call EMS. Most patients will remain confused for about 45 to 90 minutes after a seizure, so you must use judgment in calling for help. - Avoid restraints but make sure the patient is in a bed with padded side rails - Place  the individual in a lateral position with the neck slightly flexed; this will help the saliva drain from the mouth and prevent the tongue from falling backward - Remove all nearby furniture  and other hazards from the area - Provide verbal assurance as the individual is regaining consciousness - Provide the patient with privacy if possible - Call for help and start treatment as ordered by the caregiver   After the Seizure (Postictal Stage)  After a seizure, most patients experience confusion, fatigue, muscle pain and/or a headache. Thus, one should permit the individual to sleep. For the next few days, reassurance is essential. Being calm and helping reorient the person is also of importance.  Most seizures are painless and end spontaneously. Seizures are not harmful to others but can lead to complications such as stress on the lungs, brain and the heart. Individuals with prior lung problems may develop labored breathing and respiratory distress.     No orders of the defined types were placed in this encounter.   Meds ordered this encounter  Medications   Lacosamide 100 MG TABS    Sig: Take 1 tablet (100 mg total) by mouth in the morning and at bedtime.    Dispense:  60 tablet    Refill:  3    Return in about 3 months (around 10/14/2021).    Windell Norfolk, MD 07/14/2021, 8:37 AM  [facility] [address], [address] [place], [place] [address] [phone]

## 2021-07-15 ENCOUNTER — Other Ambulatory Visit: Payer: Self-pay

## 2021-07-22 ENCOUNTER — Other Ambulatory Visit: Payer: Self-pay

## 2021-07-29 ENCOUNTER — Other Ambulatory Visit: Payer: Self-pay | Admitting: Family Medicine

## 2021-07-29 ENCOUNTER — Other Ambulatory Visit: Payer: Self-pay

## 2021-07-29 DIAGNOSIS — E1169 Type 2 diabetes mellitus with other specified complication: Secondary | ICD-10-CM

## 2021-07-29 MED ORDER — METFORMIN HCL 500 MG PO TABS
ORAL_TABLET | Freq: Two times a day (BID) | ORAL | 0 refills | Status: DC
  Filled 2021-07-29: qty 120, 30d supply, fill #0

## 2021-07-29 MED FILL — Lisinopril Tab 10 MG: ORAL | 30 days supply | Qty: 30 | Fill #4 | Status: AC

## 2021-07-30 ENCOUNTER — Other Ambulatory Visit: Payer: Self-pay

## 2021-07-31 ENCOUNTER — Other Ambulatory Visit: Payer: Self-pay | Admitting: Family Medicine

## 2021-07-31 ENCOUNTER — Other Ambulatory Visit: Payer: Self-pay

## 2021-07-31 DIAGNOSIS — Z8673 Personal history of transient ischemic attack (TIA), and cerebral infarction without residual deficits: Secondary | ICD-10-CM

## 2021-07-31 NOTE — Telephone Encounter (Signed)
Got a message for refill of both Levetiracetam and Lacosamide.  I called back, it went to voicemail.  I left a message stating that patient no longer needs the Levetiracetam.  On our visit on August 23, the plan was to discontinue the Levetiracetam and to start Lacosamide. Medication sent to the pharmacy on file for lacosamide.    Windell Norfolk, MD

## 2021-08-03 ENCOUNTER — Other Ambulatory Visit: Payer: Self-pay

## 2021-08-05 ENCOUNTER — Other Ambulatory Visit: Payer: Self-pay

## 2021-08-10 ENCOUNTER — Telehealth: Payer: Self-pay | Admitting: Family Medicine

## 2021-08-10 NOTE — Telephone Encounter (Signed)
Form has been located and placed in provider box

## 2021-08-10 NOTE — Telephone Encounter (Signed)
Wanted to drop off handi-cap documentation to be filled out and to please let her know when its ready for pick up

## 2021-08-12 NOTE — Telephone Encounter (Signed)
See new encounter

## 2021-08-12 NOTE — Telephone Encounter (Signed)
Pt's daughter called and wants to be contacted when form is complete Please advise, she says patient is leaving town Saturday and would like to have it prior to leaving

## 2021-08-13 NOTE — Telephone Encounter (Signed)
Form will be given to provider and once completed will call pt

## 2021-09-02 ENCOUNTER — Other Ambulatory Visit: Payer: Self-pay

## 2021-09-02 ENCOUNTER — Other Ambulatory Visit: Payer: Self-pay | Admitting: Family Medicine

## 2021-09-02 DIAGNOSIS — E1169 Type 2 diabetes mellitus with other specified complication: Secondary | ICD-10-CM

## 2021-09-02 MED ORDER — METFORMIN HCL 500 MG PO TABS
ORAL_TABLET | Freq: Two times a day (BID) | ORAL | 0 refills | Status: DC
  Filled 2021-09-02: qty 120, 30d supply, fill #0
  Filled 2021-10-12: qty 120, 30d supply, fill #1

## 2021-09-02 MED FILL — Lisinopril Tab 10 MG: ORAL | 30 days supply | Qty: 30 | Fill #5 | Status: AC

## 2021-09-02 NOTE — Telephone Encounter (Signed)
Requested Prescriptions  Pending Prescriptions Disp Refills  . metFORMIN (GLUCOPHAGE) 500 MG tablet 180 tablet 0    Sig: TAKE 2 TABLETS (1,000 MG TOTAL) BY MOUTH 2 (TWO) TIMES DAILY WITH A MEAL.     Endocrinology:  Diabetes - Biguanides Failed - 09/02/2021 11:02 AM      Failed - HBA1C is between 0 and 7.9 and within 180 days    HbA1c, POC (controlled diabetic range)  Date Value Ref Range Status  01/09/2020 7.6 (A) 0.0 - 7.0 % Final   Hgb A1c MFr Bld  Date Value Ref Range Status  03/13/2021 8.4 (H) 4.8 - 5.6 % Final    Comment:    (NOTE) Pre diabetes:          5.7%-6.4%  Diabetes:              >6.4%  Glycemic control for   <7.0% adults with diabetes          Passed - Cr in normal range and within 360 days    Creatinine, Ser  Date Value Ref Range Status  03/14/2021 0.93 0.44 - 1.00 mg/dL Final         Passed - AA eGFR in normal range and within 360 days    GFR calc Af Amer  Date Value Ref Range Status  01/01/2021 65 >59 mL/min/1.73 Final    Comment:    **In accordance with recommendations from the NKF-ASN Task force,**   [facility] is in the process of updating its eGFR calculation to the   2021 CKD-EPI creatinine equation that estimates kidney function   without a race variable.    GFR, Estimated  Date Value Ref Range Status  03/14/2021 >60 >60 mL/min Final    Comment:    (NOTE) Calculated using the CKD-EPI Creatinine Equation (2021)          Passed - Valid encounter within last 6 months    Recent Outpatient Visits          2 months ago Essential hypertension   [facility] [place], En[place]ong, MD   4 months ago Type 2 diabetes mellitus with other specified complication, without long-term current use of insulin (HCC)   [facility] [place]lin, Enobong, MD   8 months ago Type 2 diabetes mellitus with other specified complication, without long-term current use of insulin (HCC)   [facility] McClung, Angela M, PA-C   1 year ago Type 2 diabetes mellitus with other specified complication, without long-term current use of insulin (HCC)   [facility] Newlin, Enobong, MD              

## 2021-09-03 ENCOUNTER — Other Ambulatory Visit: Payer: Self-pay

## 2021-09-04 ENCOUNTER — Other Ambulatory Visit: Payer: Self-pay

## 2021-09-07 ENCOUNTER — Ambulatory Visit: Payer: Medicare Other | Admitting: Neurology

## 2021-09-08 ENCOUNTER — Other Ambulatory Visit: Payer: Self-pay

## 2021-09-14 ENCOUNTER — Other Ambulatory Visit: Payer: Self-pay

## 2021-10-12 ENCOUNTER — Other Ambulatory Visit: Payer: Self-pay | Admitting: Family Medicine

## 2021-10-12 ENCOUNTER — Other Ambulatory Visit: Payer: Self-pay

## 2021-10-12 ENCOUNTER — Other Ambulatory Visit: Payer: Self-pay | Admitting: Pharmacist

## 2021-10-12 DIAGNOSIS — I10 Essential (primary) hypertension: Secondary | ICD-10-CM

## 2021-10-12 DIAGNOSIS — E1169 Type 2 diabetes mellitus with other specified complication: Secondary | ICD-10-CM

## 2021-10-12 DIAGNOSIS — Z8673 Personal history of transient ischemic attack (TIA), and cerebral infarction without residual deficits: Secondary | ICD-10-CM

## 2021-10-12 MED ORDER — ATENOLOL 25 MG PO TABS
ORAL_TABLET | Freq: Every day | ORAL | 2 refills | Status: DC
  Filled 2021-10-12: qty 30, 30d supply, fill #0
  Filled 2021-11-20: qty 30, 30d supply, fill #1

## 2021-10-12 MED ORDER — METFORMIN HCL 500 MG PO TABS
ORAL_TABLET | Freq: Two times a day (BID) | ORAL | 0 refills | Status: DC
  Filled 2021-10-12: qty 120, 30d supply, fill #0

## 2021-10-12 MED FILL — Lisinopril Tab 10 MG: ORAL | 30 days supply | Qty: 30 | Fill #6 | Status: AC

## 2021-10-12 NOTE — Telephone Encounter (Signed)
Requested Prescriptions  Pending Prescriptions Disp Refills  . clopidogrel (PLAVIX) 75 MG tablet 30 tablet 3    Sig: Take1 tablet by mouth daily.     Hematology: Antiplatelets - clopidogrel Failed - 10/12/2021 11:42 AM      Failed - Evaluate AST, ALT within 2 months of therapy initiation.      Failed - HCT in normal range and within 180 days    HCT  Date Value Ref Range Status  03/13/2021 42.3 36.0 - 46.0 % Final   Hematocrit  Date Value Ref Range Status  01/01/2021 42.2 34.0 - 46.6 % Final         Failed - HGB in normal range and within 180 days    Hemoglobin  Date Value Ref Range Status  03/13/2021 12.8 12.0 - 15.0 g/dL Final  01/01/2021 13.9 11.1 - 15.9 g/dL Final         Failed - PLT in normal range and within 180 days    Platelets  Date Value Ref Range Status  03/13/2021 271 150 - 400 K/uL Final  01/01/2021 331 150 - 450 x10E3/uL Final         Passed - ALT in normal range and within 360 days    ALT  Date Value Ref Range Status  03/12/2021 16 0 - 44 U/L Final         Passed - AST in normal range and within 360 days    AST  Date Value Ref Range Status  03/12/2021 20 15 - 41 U/L Final         Passed - Valid encounter within last 6 months    Recent Outpatient Visits          3 months ago Essential hypertension   [place], [place], MD   6 months ago Type 2 diabetes mellitus with other specified complication, without long-term current use of insulin ([place])   [place], [place], MD   9 months ago Type 2 diabetes mellitus with other specified complication, without long-term current use of insulin [facility])   [place], [place], [place]   1 year ago Type 2 diabetes mellitus with other specified complication, without long-term current use of insulin ([place])   [place], [place], MD             . atenolol  (TENORMIN) 25 MG tablet 30 tablet 2    Sig: Take1 tablet by mouth daily.     Cardiovascular:  Beta Blockers Failed - 10/12/2021 11:42 AM      Failed - Last BP in normal range    BP Readings from Last 1 Encounters:  07/14/21 (!) 158/73         Passed - Last Heart Rate in normal range    Pulse Readings from Last 1 Encounters:  07/14/21 65         Passed - Valid encounter within last 6 months    Recent Outpatient Visits          3 months ago Essential hypertension   [place], [place], MD   6 months ago Type 2 diabetes mellitus with other specified complication, without long-term current use of insulin ([place])   [place], Charlane Ferretti, MD   9 months ago Type 2 diabetes mellitus with other specified complication, without long-term current use of insulin (  [facility])   [facility] [place], [place], [place]   1 year ago Type 2 diabetes mellitus with other specified complication, without long-term current use of insulin [facility])   [facility] Hoy Register, MD

## 2021-10-12 NOTE — Telephone Encounter (Signed)
Requested medications are due for refill today.  yes  Requested medications are on the active medications list.  yes  Last refill. 05/18/2021  Future visit scheduled.   no  Notes to clinic.  Labs are expired

## 2021-10-13 ENCOUNTER — Other Ambulatory Visit: Payer: Self-pay

## 2021-10-13 MED ORDER — CLOPIDOGREL BISULFATE 75 MG PO TABS
ORAL_TABLET | Freq: Every day | ORAL | 0 refills | Status: DC
  Filled 2021-10-13: qty 30, 30d supply, fill #0

## 2021-10-19 ENCOUNTER — Other Ambulatory Visit: Payer: Self-pay

## 2021-10-20 ENCOUNTER — Ambulatory Visit: Payer: Medicare Other | Admitting: Neurology

## 2021-10-20 ENCOUNTER — Ambulatory Visit (INDEPENDENT_AMBULATORY_CARE_PROVIDER_SITE_OTHER): Payer: Medicare Other | Admitting: Neurology

## 2021-10-20 ENCOUNTER — Encounter: Payer: Self-pay | Admitting: Neurology

## 2021-10-20 ENCOUNTER — Other Ambulatory Visit: Payer: Self-pay

## 2021-10-20 VITALS — BP 136/64 | HR 73 | Ht 63.0 in | Wt 184.0 lb

## 2021-10-20 DIAGNOSIS — Z5181 Encounter for therapeutic drug level monitoring: Secondary | ICD-10-CM

## 2021-10-20 DIAGNOSIS — Z8673 Personal history of transient ischemic attack (TIA), and cerebral infarction without residual deficits: Secondary | ICD-10-CM

## 2021-10-20 DIAGNOSIS — G40009 Localization-related (focal) (partial) idiopathic epilepsy and epileptic syndromes with seizures of localized onset, not intractable, without status epilepticus: Secondary | ICD-10-CM | POA: Diagnosis not present

## 2021-10-20 MED ORDER — LACOSAMIDE 100 MG PO TABS
100.0000 mg | ORAL_TABLET | Freq: Two times a day (BID) | ORAL | 4 refills | Status: DC
  Filled 2021-10-20: qty 180, 90d supply, fill #0
  Filled 2021-10-22: qty 60, 30d supply, fill #0

## 2021-10-20 NOTE — Patient Instructions (Signed)
Continue current medication, Lacosamide 100 mg twice daily  Will check a Lacosamide level today  Return in 1 year

## 2021-10-20 NOTE — Progress Notes (Signed)
[facility]  PATIENT: [name] DOB: [birth year]  REFERRING CLINICIAN: Hoy Register, MD HISTORY FROM: Daughter [name]  REASON FOR VISIT: Seizure   HISTORICAL  CHIEF COMPLAINT:  Chief Complaint  Patient presents with   Follow-up    Rm 12. Accompanied by daughter.  Patient states lacosamide is working well. Denies concerns.    INTERVAL HISTORY 10/20/2021 Patient present today for follow-up with daughter [name].  At last visit plan was to discontinue Keppra and start the patient on lacosamide due to home increased daytime sleepiness.  Since starting the lacosamide, daytime sleepiness has improved, she is more active and more alert during the daytime.  Denies any seizures since last visit.  Denies any side effect from the medication.  Daughter is satisfied with the current regimen and has no complaint or concern at the moment.  HISTORY OF PRESENT ILLNESS:  This is a 75 year old woman with past medical history of strokes, diabetes mellitus type 2 who is presenting after a seizure in April. Daughter at bedside reports patient was outside smoking, then she called for help, grandson who was in the house went to help her, he noticed that she was dragging her left foot, she was not talking and fell off grandson's arms.  He helped her the floor and notice patient had her mouth opened and was staring. Episode lasted a couple minutes, by the time EMS arrived patient episode ended but she was confused and was not talking.  She was taken to the ED where she had a stroke work-up which was negative for acute stroke and had a EEG which showed a epileptogenic area in the left frontal and left temporal region.  She was started on Keppra 500 mg XR .  Daughter mentioned this was the first time the patient had an event like that, since being on Levetiracetam, she has not had any episode similar to the previous one.  But he has been complaining of unstable gait, dizziness, states  things are dizzy, daughter also mentioned that patient has some visual hallucination, seeing two little girls talking to her, sometimes she will see someone talking to her daughter, she also had a complaint of joint pain, daughter is not sure if this is related to her known arthritis or this is a related to the Keppra.  Daughter mentioned the patient is still independent she is able to cook clean, bath and dress herself, she is not driving, she has issues with speech and comprehension but she is able to follow simple commands    Handedness: Right handed   Seizure Type: Unclear, possibly focal seizure  Current frequency: Only once   Any injuries from seizures: None  Seizure risk factors: Multiple stroke, no family hx of seizure  Previous ASMs: Levetiracetam   Currenty ASMs: Levetiracetam 500 mg XR  ASMs side effects: Sleepiness, dizziness, visual hallucinations, mood changes   Brain Images: 1. No evidence of acute intracranial abnormality. Specifically, no acute infarct. 2. 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 3. Chronic left M1 occlusion better characterized on same day CTA.  Previous EEGs: rEEG: This study showed evidence of epileptogenicity as well as cortical dysfunction in left frontotemporal region likely secondary to underlying stroke.  No seizures or epileptiform discharges were seen throughout the recording   OTHER MEDICAL CONDITIONS: Multiple strokes, DMII, Dementia  REVIEW OF SYSTEMS: Unable to fully complete as patient has aphasia.   ALLERGIES: Allergies  Allergen Reactions   Penicillins Itching   Sulfa Antibiotics  Other (See Comments)    Reaction not recalled, but patient was told she was allergic    HOME MEDICATIONS: Outpatient Medications Prior to Visit  Medication Sig Dispense Refill   acetaminophen (TYLENOL) 325 MG tablet Take 650 mg by mouth every 6 (six) hours as needed for pain.     aspirin 81 MG chewable  tablet Chew 81 mg by mouth in the morning.     atenolol (TENORMIN) 25 MG tablet Take1 tablet by mouth daily. 30 tablet 2   atorvastatin (LIPITOR) 20 MG tablet Take 1 tablet (20 mg total) by mouth daily. 30 tablet 6   Blood Glucose Monitoring Suppl (ACCU-CHEK AVIVA) device Use as instructed daily. 1 each 0   clopidogrel (PLAVIX) 75 MG tablet Take1 tablet by mouth daily. 30 tablet 0   diclofenac Sodium (VOLTAREN) 1 % GEL Apply 4 g topically 4 (four) times daily. 100 g 1   glucose blood (TRUE METRIX BLOOD GLUCOSE TEST) test strip Use as instructed 100 each 6   Lancets (ACCU-CHEK MULTICLIX) lancets Use as instructed 100 each 12   lisinopril (ZESTRIL) 10 MG tablet TAKE 1 TABLET (10 MG TOTAL) BY MOUTH DAILY. (Patient taking differently: Take 10 mg by mouth daily.) 90 tablet 3   metFORMIN (GLUCOPHAGE) 500 MG tablet TAKE 2 TABLETS (1,000 MG TOTAL) BY MOUTH 2 (TWO) TIMES DAILY WITH A MEAL. 120 tablet 0   traMADol (ULTRAM) 50 MG tablet Take 1 tablet (50 mg total) by mouth at bedtime as needed. For chronic bilateral osteoarthritis 30 tablet 1   Lacosamide 100 MG TABS Take 1 tablet (100 mg total) by mouth in the morning and at bedtime. 60 tablet 3   No facility-administered medications prior to visit.    PAST MEDICAL HISTORY: Past Medical History:  Diagnosis Date   Diabetes mellitus    H/O: CVA (cerebrovascular accident) 07/08/2014   Hypertension    Stroke (HCC)     PAST SURGICAL HISTORY: Past Surgical History:  Procedure Laterality Date   NO PAST SURGERIES      FAMILY HISTORY: Family History  Problem Relation Age of Onset   Hyperlipidemia Mother    Hypertension Mother    Diabetes Mother    Stroke Father     SOCIAL HISTORY: Social History   Socioeconomic History   Marital status: Widowed    Spouse name: Not on file   Number of children: 3   Years of education: 12   Highest education level: High school graduate  Occupational History   Occupation: Retired  Tobacco Use   Smoking  status: Every Day    Packs/day: 1.00    Years: 48.00    Pack years: 48.00    Types: Cigarettes   Smokeless tobacco: Never  Substance and Sexual Activity   Alcohol use: No   Drug use: No   Sexual activity: Not on file  Other Topics Concern   Not on file  Social History Narrative   Lives with daughter, [name], and her grandchildren.   Right-handed.   Three cups caffeine daily.   Social Determinants of Health   Financial Resource Strain: Not on file  Food Insecurity: Not on file  Transportation Needs: Not on file  Physical Activity: Not on file  Stress: Not on file  Social Connections: Not on file  Intimate Partner Violence: Not on file     PHYSICAL EXAM  GENERAL EXAM/CONSTITUTIONAL: Vitals:  Vitals:   10/20/21 1243  BP: 136/64  Pulse: 73  Weight: 184 lb (83.5 kg)  Height:  5\' 3"  (1.6 m)    Body mass index is 32.59 kg/m. Wt Readings from Last 3 Encounters:  10/20/21 184 lb (83.5 kg)  07/14/21 181 lb 8 oz (82.3 kg)  04/09/21 187 lb 3.2 oz (84.9 kg)   Patient is in no distress; well developed, nourished and groomed; neck is supple  CARDIOVASCULAR: Examination of carotid arteries is normal; no carotid bruits Regular rate and rhythm, no murmurs Examination of peripheral vascular system by observation and palpation is normal  EYES: Pupils round and reactive to light, Visual fields full to confrontation, Extraocular movements intacts,   MUSCULOSKELETAL: Gait, strength, tone, movements noted in Neurologic exam below  NEUROLOGIC: MENTAL STATUS: awake, alert, oriented to person,  Unable to provide history  normal attention and concentration language aphasic, able to name simple object, able to follow simple 1 step commands but not 2 step embedded commands, unable to repeat   CRANIAL NERVE:  2nd, 3rd, 4th, 6th - pupils equal and reactive to light, visual fields full to confrontation, extraocular muscles intact, no nystagmus 5th - facial sensation  symmetric 7th - facial strength symmetric 8th - hearing intact 9th - palate elevates symmetrically, uvula midline 11th - shoulder shrug symmetric 12th - tongue protrusion midline  MOTOR:  normal bulk and tone, full strength in the BUE, BLE  SENSORY:  normal and symmetric to light touch  COORDINATION:  finger-nose-finger, fine finger movements normal  REFLEXES:  deep tendon reflexes present and symmetric  GAIT/STATION:  Mild hemiplegic gait    DIAGNOSTIC DATA (LABS, IMAGING, TESTING) - I reviewed patient records, labs, notes, testing and imaging myself where available.  Lab Results  Component Value Date   WBC 8.5 03/13/2021   HGB 12.8 03/13/2021   HCT 42.3 03/13/2021   MCV 89.8 03/13/2021   PLT 271 03/13/2021      Component Value Date/Time   NA 139 03/14/2021 0240   NA 142 01/01/2021 1647   K 4.1 03/14/2021 0240   CL 108 03/14/2021 0240   CO2 22 03/14/2021 0240   GLUCOSE 139 (H) 03/14/2021 0240   BUN 9 03/14/2021 0240   BUN 9 01/01/2021 1647   CREATININE 0.93 03/14/2021 0240   CALCIUM 8.4 (L) 03/14/2021 0240   PROT 6.6 03/12/2021 1500   PROT 6.6 01/01/2021 1647   ALBUMIN 3.8 03/12/2021 1500   ALBUMIN 4.2 01/01/2021 1647   AST 20 03/12/2021 1500   ALT 16 03/12/2021 1500   ALKPHOS 64 03/12/2021 1500   BILITOT 0.8 03/12/2021 1500   BILITOT 0.3 01/01/2021 1647   GFRNONAA >60 03/14/2021 0240   GFRAA 65 01/01/2021 1647   Lab Results  Component Value Date   CHOL 132 03/13/2021   HDL 41 03/13/2021   LDLCALC 70 03/13/2021   TRIG 105 03/13/2021   Lab Results  Component Value Date   HGBA1C 8.4 (H) 03/13/2021   Lab Results  Component Value Date   VITAMINB12 69 (L) 03/12/2021   Lab Results  Component Value Date   TSH 0.785 03/12/2021    MRI Brain 4/21/220 1. No evidence of acute intracranial abnormality. Specifically, no acute infarct. 2. 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 3. Chronic left M1  occlusion better characterized on same day CTA.  Routine EEG 03/13/21 This study showed evidence of epileptogenicity as well as cortical dysfunction in left frontotemporal region likely secondary to underlying stroke.  No seizures or epileptiform discharges were seen throughout the recording  I personally reviewed brain Images and previous EEG reports.  ASSESSMENT AND PLAN  75 y.o. year old female here with past medical history of multiple strokes, diabetes mellitus type 2, seizure disorder who is presenting for follow-up.  She is currently on lacosamide 100 mg twice daily, denies any side effect from the medication, denies any seizures since last visit.  She had tried in the past Keppra but had side effect of daytime sleepiness.  She is doing well on the lacosamide, no other complaints, no other concerns.  We will continue lacosamide at the same dose, I will also get a level today.  I will see the patient in 1 year for follow-up.   1. Localization-related idiopathic epilepsy and epileptic syndromes with seizures of localized onset, not intractable, without status epilepticus (HCC)   2. History of multiple strokes   3. Therapeutic drug monitoring     PLAN: Continue current medication, Lacosamide 100 mg twice daily  Will check a Lacosamide level today  Return in 1 year    Per [facility] statutes, patients with seizures are not allowed to drive until they have been seizure-free for six months.  Other recommendations include using caution when using heavy equipment or power tools. Avoid working on ladders or at heights. Take showers instead of baths.  Do not swim alone.  Ensure the water temperature is not too high on the home water heater. Do not go swimming alone. Do not lock yourself in a room alone (i.e. bathroom). When caring for infants or small children, sit down when holding, feeding, or changing them to minimize risk of injury to the child in the event you have a seizure.  Maintain good sleep hygiene. Avoid alcohol.  Also recommend adequate sleep, hydration, good diet and minimize stress.   During the Seizure  - First, ensure adequate ventilation and place patients on the floor on their left side  Loosen clothing around the neck and ensure the airway is patent. If the patient is clenching the teeth, do not force the mouth open with any object as this can cause severe damage - Remove all items from the surrounding that can be hazardous. The patient may be oblivious to what's happening and may not even know what he or she is doing. If the patient is confused and wandering, either gently guide him/her away and block access to outside areas - Reassure the individual and be comforting - Call 911. In most cases, the seizure ends before EMS arrives. However, there are cases when seizures may last over 3 to 5 minutes. Or the individual may have developed breathing difficulties or severe injuries. If a pregnant patient or a person with diabetes develops a seizure, it is prudent to call an ambulance. - Finally, if the patient does not regain full consciousness, then call EMS. Most patients will remain confused for about 45 to 90 minutes after a seizure, so you must use judgment in calling for help. - Avoid restraints but make sure the patient is in a bed with padded side rails - Place the individual in a lateral position with the neck slightly flexed; this will help the saliva drain from the mouth and prevent the tongue from falling backward - Remove all nearby furniture and other hazards from the area - Provide verbal assurance as the individual is regaining consciousness - Provide the patient with privacy if possible - Call for help and start treatment as ordered by the caregiver   After the Seizure (Postictal Stage)  After a seizure, most patients experience confusion, fatigue, muscle  pain and/or a headache. Thus, one should permit the individual to sleep. For the next  few days, reassurance is essential. Being calm and helping reorient the person is also of importance.  Most seizures are painless and end spontaneously. Seizures are not harmful to others but can lead to complications such as stress on the lungs, brain and the heart. Individuals with prior lung problems may develop labored breathing and respiratory distress.     Orders Placed This Encounter  Procedures   Lacosamide     Meds ordered this encounter  Medications   Lacosamide 100 MG TABS    Sig: Take 1 tablet (100 mg total) by mouth in the morning and at bedtime.    Dispense:  180 tablet    Refill:  4     Return in about 1 year (around 10/20/2022).    Windell Norfolk, MD 10/20/2021, 1:45 PM  [facility] [address], [address] [place], [place] [address] [phone]

## 2021-10-22 ENCOUNTER — Other Ambulatory Visit: Payer: Self-pay

## 2021-10-22 ENCOUNTER — Telehealth: Payer: Self-pay | Admitting: *Deleted

## 2021-10-22 LAB — LACOSAMIDE: Lacosamide: 1.2 ug/mL — ABNORMAL LOW (ref 5.0–10.0)

## 2021-10-22 NOTE — Telephone Encounter (Signed)
Left patient a detailed message on daughter's voicemail with results and plan (ok per DPR).  Provided our number to call back with any questions.

## 2021-10-22 NOTE — Progress Notes (Signed)
Please call and advise the patient that her Lamictal level was low. We will continue to monitor her but if she has another seizure, we will go up on the medications.  Please remind patient to keep any upcoming appointments or tests and to call us with any interim questions, concerns, problems or updates. Thanks,   Windell Norfolk, MD

## 2021-10-23 ENCOUNTER — Other Ambulatory Visit: Payer: Self-pay

## 2021-11-04 ENCOUNTER — Inpatient Hospital Stay (HOSPITAL_COMMUNITY)
Admission: EM | Admit: 2021-11-04 | Discharge: 2021-11-06 | DRG: 092 | Disposition: A | Discharge status: Home Health | Payer: Medicare Other | Attending: Internal Medicine | Admitting: Internal Medicine

## 2021-11-04 ENCOUNTER — Emergency Department (HOSPITAL_COMMUNITY): Payer: Medicare Other

## 2021-11-04 ENCOUNTER — Encounter (HOSPITAL_COMMUNITY): Payer: Self-pay

## 2021-11-04 ENCOUNTER — Other Ambulatory Visit: Payer: Self-pay

## 2021-11-04 DIAGNOSIS — I10 Essential (primary) hypertension: Secondary | ICD-10-CM | POA: Diagnosis not present

## 2021-11-04 DIAGNOSIS — E1159 Type 2 diabetes mellitus with other circulatory complications: Secondary | ICD-10-CM

## 2021-11-04 DIAGNOSIS — N179 Acute kidney failure, unspecified: Secondary | ICD-10-CM | POA: Diagnosis not present

## 2021-11-04 DIAGNOSIS — R4182 Altered mental status, unspecified: Secondary | ICD-10-CM | POA: Diagnosis present

## 2021-11-04 DIAGNOSIS — M25552 Pain in left hip: Secondary | ICD-10-CM | POA: Diagnosis present

## 2021-11-04 DIAGNOSIS — Z833 Family history of diabetes mellitus: Secondary | ICD-10-CM

## 2021-11-04 DIAGNOSIS — R569 Unspecified convulsions: Secondary | ICD-10-CM

## 2021-11-04 DIAGNOSIS — F17210 Nicotine dependence, cigarettes, uncomplicated: Secondary | ICD-10-CM | POA: Diagnosis present

## 2021-11-04 DIAGNOSIS — Z7982 Long term (current) use of aspirin: Secondary | ICD-10-CM

## 2021-11-04 DIAGNOSIS — Z7984 Long term (current) use of oral hypoglycemic drugs: Secondary | ICD-10-CM

## 2021-11-04 DIAGNOSIS — Z8673 Personal history of transient ischemic attack (TIA), and cerebral infarction without residual deficits: Secondary | ICD-10-CM

## 2021-11-04 DIAGNOSIS — E538 Deficiency of other specified B group vitamins: Secondary | ICD-10-CM | POA: Diagnosis present

## 2021-11-04 DIAGNOSIS — Z83438 Family history of other disorder of lipoprotein metabolism and other lipidemia: Secondary | ICD-10-CM

## 2021-11-04 DIAGNOSIS — R471 Dysarthria and anarthria: Secondary | ICD-10-CM | POA: Diagnosis present

## 2021-11-04 DIAGNOSIS — Z20822 Contact with and (suspected) exposure to covid-19: Secondary | ICD-10-CM | POA: Diagnosis present

## 2021-11-04 DIAGNOSIS — R0902 Hypoxemia: Secondary | ICD-10-CM | POA: Diagnosis present

## 2021-11-04 DIAGNOSIS — Z823 Family history of stroke: Secondary | ICD-10-CM

## 2021-11-04 DIAGNOSIS — G928 Other toxic encephalopathy: Secondary | ICD-10-CM | POA: Diagnosis not present

## 2021-11-04 DIAGNOSIS — Z7902 Long term (current) use of antithrombotics/antiplatelets: Secondary | ICD-10-CM

## 2021-11-04 DIAGNOSIS — R269 Unspecified abnormalities of gait and mobility: Secondary | ICD-10-CM

## 2021-11-04 DIAGNOSIS — E119 Type 2 diabetes mellitus without complications: Secondary | ICD-10-CM

## 2021-11-04 DIAGNOSIS — R4701 Aphasia: Secondary | ICD-10-CM | POA: Diagnosis not present

## 2021-11-04 DIAGNOSIS — I69320 Aphasia following cerebral infarction: Secondary | ICD-10-CM

## 2021-11-04 DIAGNOSIS — Z8249 Family history of ischemic heart disease and other diseases of the circulatory system: Secondary | ICD-10-CM

## 2021-11-04 DIAGNOSIS — G40909 Epilepsy, unspecified, not intractable, without status epilepticus: Secondary | ICD-10-CM | POA: Diagnosis present

## 2021-11-04 DIAGNOSIS — Z79899 Other long term (current) drug therapy: Secondary | ICD-10-CM

## 2021-11-04 DIAGNOSIS — E669 Obesity, unspecified: Secondary | ICD-10-CM | POA: Diagnosis present

## 2021-11-04 LAB — DIFFERENTIAL
Abs Immature Granulocytes: 0.02 10*3/uL (ref 0.00–0.07)
Basophils Absolute: 0.1 10*3/uL (ref 0.0–0.1)
Basophils Relative: 1 %
Eosinophils Absolute: 0.1 10*3/uL (ref 0.0–0.5)
Eosinophils Relative: 1 %
Immature Granulocytes: 0 %
Lymphocytes Relative: 37 %
Lymphs Abs: 3.2 10*3/uL (ref 0.7–4.0)
Monocytes Absolute: 0.5 10*3/uL (ref 0.1–1.0)
Monocytes Relative: 5 %
Neutro Abs: 4.8 10*3/uL (ref 1.7–7.7)
Neutrophils Relative %: 56 %

## 2021-11-04 LAB — COMPREHENSIVE METABOLIC PANEL
ALT: 13 U/L (ref 0–44)
AST: 18 U/L (ref 15–41)
Albumin: 3.7 g/dL (ref 3.5–5.0)
Alkaline Phosphatase: 66 U/L (ref 38–126)
Anion gap: 9 (ref 5–15)
BUN: 16 mg/dL (ref 8–23)
CO2: 27 mmol/L (ref 22–32)
Calcium: 9.2 mg/dL (ref 8.9–10.3)
Chloride: 104 mmol/L (ref 98–111)
Creatinine, Ser: 1.25 mg/dL — ABNORMAL HIGH (ref 0.44–1.00)
GFR, Estimated: 46 mL/min — ABNORMAL LOW (ref >60)
Glucose, Bld: 155 mg/dL — ABNORMAL HIGH (ref 70–99)
Potassium: 4 mmol/L (ref 3.5–5.1)
Sodium: 140 mmol/L (ref 135–145)
Total Bilirubin: 0.3 mg/dL (ref 0.3–1.2)
Total Protein: 6.5 g/dL (ref 6.5–8.1)

## 2021-11-04 LAB — I-STAT VENOUS BLOOD GAS, ED
Acid-Base Excess: 5 mmol/L — ABNORMAL HIGH (ref 0.0–2.0)
Bicarbonate: 31.1 mmol/L — ABNORMAL HIGH (ref 20.0–28.0)
Calcium, Ion: 1.18 mmol/L (ref 1.15–1.40)
HCT: 41 % (ref 36.0–46.0)
Hemoglobin: 13.9 g/dL (ref 12.0–15.0)
O2 Saturation: 79 %
Potassium: 3.9 mmol/L (ref 3.5–5.1)
Sodium: 143 mmol/L (ref 135–145)
TCO2: 33 mmol/L — ABNORMAL HIGH (ref 22–32)
pCO2, Ven: 53 mmHg (ref 44.0–60.0)
pH, Ven: 7.376 (ref 7.250–7.430)
pO2, Ven: 45 mmHg (ref 32.0–45.0)

## 2021-11-04 LAB — I-STAT CHEM 8, ED
BUN: 20 mg/dL (ref 8–23)
Calcium, Ion: 1.17 mmol/L (ref 1.15–1.40)
Chloride: 102 mmol/L (ref 98–111)
Creatinine, Ser: 1.1 mg/dL — ABNORMAL HIGH (ref 0.44–1.00)
Glucose, Bld: 153 mg/dL — ABNORMAL HIGH (ref 70–99)
HCT: 42 % (ref 36.0–46.0)
Hemoglobin: 14.3 g/dL (ref 12.0–15.0)
Potassium: 3.9 mmol/L (ref 3.5–5.1)
Sodium: 142 mmol/L (ref 135–145)
TCO2: 30 mmol/L (ref 22–32)

## 2021-11-04 LAB — PROTIME-INR
INR: 1 (ref 0.8–1.2)
Prothrombin Time: 12.9 seconds (ref 11.4–15.2)

## 2021-11-04 LAB — ETHANOL: Alcohol, Ethyl (B): <10 mg/dL (ref <10)

## 2021-11-04 LAB — CBC
HCT: 42.8 % (ref 36.0–46.0)
Hemoglobin: 13.5 g/dL (ref 12.0–15.0)
MCH: 27.3 pg (ref 26.0–34.0)
MCHC: 31.5 g/dL (ref 30.0–36.0)
MCV: 86.5 fL (ref 80.0–100.0)
Platelets: 319 10*3/uL (ref 150–400)
RBC: 4.95 MIL/uL (ref 3.87–5.11)
RDW: 14.1 % (ref 11.5–15.5)
WBC: 8.6 10*3/uL (ref 4.0–10.5)
nRBC: 0 % (ref 0.0–0.2)

## 2021-11-04 LAB — APTT: aPTT: 28 seconds (ref 24–36)

## 2021-11-04 LAB — TROPONIN I (HIGH SENSITIVITY): Troponin I (High Sensitivity): 16 ng/L (ref <18)

## 2021-11-04 MED ORDER — ACETAMINOPHEN 325 MG PO TABS: 650.0000 mg | ORAL_TABLET | ORAL | Status: DC | PRN

## 2021-11-04 MED ORDER — ACETAMINOPHEN 650 MG RE SUPP: 650.0000 mg | RECTAL | Status: DC | PRN

## 2021-11-04 MED ORDER — STROKE: EARLY STAGES OF RECOVERY BOOK: Freq: Once | Status: AC

## 2021-11-04 MED ORDER — ENOXAPARIN SODIUM 40 MG/0.4ML IJ SOSY
40.0000 mg | PREFILLED_SYRINGE | INTRAMUSCULAR | Status: DC
  Administered 2021-11-05 – 2021-11-06 (×2): 40 mg via SUBCUTANEOUS
  Filled 2021-11-04 (×2): qty 0.4

## 2021-11-04 MED ORDER — IOHEXOL 350 MG/ML SOLN
70.0000 mL | Freq: Once | INTRAVENOUS | Status: AC | PRN
  Administered 2021-11-04: 22:00:00 70 mL via INTRAVENOUS

## 2021-11-04 MED ORDER — ACETAMINOPHEN 160 MG/5ML PO SOLN: 650.0000 mg | ORAL | Status: DC | PRN

## 2021-11-04 MED ORDER — SENNOSIDES-DOCUSATE SODIUM 8.6-50 MG PO TABS: 1.0000 | ORAL_TABLET | Freq: Every evening | ORAL | Status: DC | PRN

## 2021-11-04 NOTE — ED Notes (Signed)
Called [facility] to activate code stroke  

## 2021-11-04 NOTE — ED Triage Notes (Signed)
Pt here via [facility] d/t altered mental status. [facility] was an hour PTA. Pt presented with hypoxia. Pt presented with being alert only to self. Pt does have hx of dementia and CVA. Per medic, patient not moving much air on right side and finger tips cyanotic in appearance. CBG: 242. Other vitals per medic, BP: 180/120, HR: 70 and SpO2 80% on NRB.

## 2021-11-04 NOTE — ED Provider Notes (Signed)
[facility] EMERGENCY DEPARTMENT Provider Note   CSN: [id number] Arrival date & time: 11/04/21  2029     History Chief Complaint  Patient presents with   Altered Mental Status    [name] is a 75 y.o. female history of previous stroke, hypertension, seizure, here presenting with altered mental status.  Patient arrives very altered and unable to give much history.  Daughter later arrived and states that around 7 PM, patient became subsequently altered when she was talking.  She may have passed out and patient became aphasic.  Patient unable to come up with words at that time.  Patient's hands was noted to be cold and EMS was not able to get a good pulse ox on her.  Patient has a history of stroke in the past with no residual deficits  The history is provided by the patient and a relative.      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/08/2014   Stroke (HCC)    Hypertension     Past Surgical History:  Procedure Laterality Date   NO PAST SURGERIES       OB History     Gravida  4   Para  4   Term  4   Preterm  0   AB  0   Living  3      SAB  0   IAB  0   Ectopic  0   Multiple  0   Live Births              Family History  Problem Relation Age of Onset   Hyperlipidemia Mother    Hypertension Mother    Diabetes Mother    Stroke Father     Social History   Tobacco Use   Smoking status: Every Day    Packs/day: 1.00    Years: 48.00    Pack years: 48.00    Types: Cigarettes   Smokeless  tobacco: Never  Substance Use Topics   Alcohol use: No   Drug use: No    Home Medications Prior to Admission medications   Medication Sig Start Date End Date Taking? Authorizing Provider  acetaminophen (TYLENOL) 325 MG tablet Take 650 mg by mouth every 6 (six) hours as needed for pain.    [provider]  aspirin 81 MG chewable tablet Chew 81 mg by mouth in the morning.    [provider]  atenolol (TENORMIN) 25 MG tablet Take1 tablet by mouth daily. 10/12/21 10/12/22  Hoy Register, MD  atorvastatin (LIPITOR) 20 MG tablet Take 1 tablet (20 mg total) by mouth daily. 06/18/21 06/18/22  Hoy Register, MD  Blood Glucose Monitoring Suppl (ACCU-CHEK AVIVA) device Use as instructed daily. 01/09/20   Hoy Register, MD  clopidogrel (PLAVIX) 75 MG tablet Take1 tablet by mouth daily. 10/13/21 10/13/22  Hoy Register, MD  diclofenac Sodium (VOLTAREN) 1 % GEL Apply 4 g topically 4 (four) times daily. 04/09/21   Hoy Register, MD  glucose blood (TRUE METRIX BLOOD GLUCOSE  TEST) test strip Use as instructed 01/09/20   Hoy Register, MD  Lacosamide 100 MG TABS Take 1 tablet (100 mg total) by mouth in the morning and at bedtime. 10/20/21 01/18/22  Windell Norfolk, MD  Lancets (ACCU-CHEK MULTICLIX) lancets Use as instructed 01/09/20   Hoy Register, MD  lisinopril (ZESTRIL) 10 MG tablet TAKE 1 TABLET (10 MG TOTAL) BY MOUTH DAILY. Patient taking differently: Take 10 mg by mouth daily. 01/01/21 01/01/22  Anders Simmonds, PA-C  metFORMIN (GLUCOPHAGE) 500 MG tablet TAKE 2 TABLETS (1,000 MG TOTAL) BY MOUTH 2 (TWO) TIMES DAILY WITH A MEAL. 10/12/21 10/12/22  Hoy Register, MD  traMADol (ULTRAM) 50 MG tablet Take 1 tablet (50 mg total) by mouth at bedtime as needed. For chronic bilateral osteoarthritis 06/18/21   Hoy Register, MD    Allergies    Penicillins and Sulfa antibiotics  Review of Systems   Review of Systems  Neurological:  Positive for syncope and weakness.  All other  systems reviewed and are negative.  Physical Exam Updated Vital Signs BP (!) 121/54    Pulse 73    Temp 97.8 F (36.6 C) (Axillary)    Resp 16    Ht 5\' 3"  (1.6 m)    Wt 83.5 kg    LMP 09/28/2014 (Approximate)    SpO2 99%    BMI 32.59 kg/m   Physical Exam Vitals and nursing note reviewed.  Constitutional:      Comments: Altered and confused and some expressive aphasia  HENT:     Head: Normocephalic.     Nose: Nose normal.     Mouth/Throat:     Mouth: Mucous membranes are moist.  Eyes:     Extraocular Movements: Extraocular movements intact.     Pupils: Pupils are equal, round, and reactive to light.  Cardiovascular:     Rate and Rhythm: Normal rate and regular rhythm.     Pulses: Normal pulses.     Heart sounds: Normal heart sounds.  Pulmonary:     Effort: Pulmonary effort is normal.     Breath sounds: Normal breath sounds.  Abdominal:     General: Abdomen is flat.     Palpations: Abdomen is soft.  Musculoskeletal:        General: Normal range of motion.     Cervical back: Normal range of motion and neck supple.  Skin:    General: Skin is warm.     Capillary Refill: Capillary refill takes less than 2 seconds.  Neurological:     Comments: Patient has strength 4 out of 5 on the left arm and leg. Patient has no obvious facial droop.  Patient has difficulty following commands and some expressive aphasia  Psychiatric:        Mood and Affect: Mood normal.        Behavior: Behavior normal.    ED Results / Procedures / Treatments   Labs (all labs ordered are listed, but only abnormal results are displayed) Labs Reviewed  COMPREHENSIVE METABOLIC PANEL - Abnormal; Notable for the following components:      Result Value   Glucose, Bld 155 (*)    Creatinine, Ser 1.25 (*)    GFR, Estimated 46 (*)    All other components within normal limits  I-STAT CHEM 8, ED - Abnormal; Notable for the following components:   Creatinine, Ser 1.10 (*)    Glucose, Bld 153 (*)    All other  components within normal limits  I-STAT VENOUS BLOOD GAS, ED -  Abnormal; Notable for the following components:   Bicarbonate 31.1 (*)    TCO2 33 (*)    Acid-Base Excess 5.0 (*)    All other components within normal limits  RESP PANEL BY RT-PCR (FLU A&B, COVID) ARPGX2  ETHANOL  PROTIME-INR  APTT  CBC  DIFFERENTIAL  RAPID URINE DRUG SCREEN, HOSP PERFORMED  URINALYSIS, ROUTINE W REFLEX MICROSCOPIC  TROPONIN I (HIGH SENSITIVITY)    EKG None  Radiology CT ANGIO HEAD NECK W WO CM  Result Date: 11/04/2021 CLINICAL DATA:  Stroke/TIA, determinate embolic source EXAM: CT ANGIOGRAPHY HEAD AND NECK TECHNIQUE: Multidetector CT imaging of the head and neck was performed using the standard protocol during bolus administration of intravenous contrast. Multiplanar CT image reconstructions and MIPs were obtained to evaluate the vascular anatomy. Carotid stenosis measurements (when applicable) are obtained utilizing NASCET criteria, using the distal internal carotid diameter as the denominator. CONTRAST:  58mL OMNIPAQUE IOHEXOL 350 MG/ML SOLN COMPARISON:  03/12/2021 CTA. FINDINGS: CT HEAD FINDINGS For noncontrast findings, please see same day CT head. CTA NECK FINDINGS Aortic arch: Standard branching. Imaged portion shows no evidence of aneurysm or dissection. No significant stenosis of the major arch vessel origins. Plaque at the origins of the arch vessels is not hemodynamically significant, worst at the left subclavian origin. Right carotid system: No evidence of dissection, stenosis (50% or greater) or occlusion. Noncalcified plaque at the origin of the left ICA is not hemodynamically significant. Retropharyngeal course of the right CCA. Left carotid system: No evidence of dissection, stenosis (50% or greater) or occlusion. The left common and internal carotid artery are relatively diminutive. Vertebral arteries: Codominant. No evidence of dissection, stenosis (50% or greater) or occlusion. Skeleton:  Degenerative changes in the cervical spine. No acute osseous abnormality. Other neck: Negative. Upper chest: No focal pulmonary opacity or pleural effusion. Review of the MIP images confirms the above findings CTA HEAD FINDINGS Anterior circulation: Both internal carotid arteries are patent to the termini, with calcified plaque causing mild stenosis. A1 segments patent. Normal anterior communicating artery. Anterior cerebral arteries are patent to their distal aspects. Redemonstrated occlusion of the left MCA, which appears chronic, with redemonstrated collaterals. Minimal flow in the distal left MCA branches. The right M1 is patent with mild narrowing distally. More diminutive M2 and distal MCA branches, without focal occlusion or stenosis. Posterior circulation: Vertebral arteries patent to the vertebrobasilar junction without stenosis. Posterior inferior cerebral arteries patent bilaterally. Basilar patent to its distal aspect. Superior cerebellar arteries patent bilaterally. PCAs perfused to their distal aspects without stenosis. The bilateral posterior communicating arteries are not visualized. Venous sinuses: As permitted by contrast timing, patent. Anatomic variants: None significant Review of the MIP images confirms the above findings IMPRESSION: 1. 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 2. Patent right M1, with distal narrowing and diminutive right M2 and distal MCA branches, without focal occlusion or stenosis, unchanged. 3. No hemodynamically significant stenosis in the neck. Electronically Signed   By: Wiliam Ke M.D.   On: 11/04/2021 22:07   DG Chest Port 1 View  Result Date: 11/04/2021 CLINICAL DATA:  Shortness of breath. EXAM: PORTABLE CHEST 1 VIEW COMPARISON:  March 12, 2021. FINDINGS: The heart size and mediastinal contours are within normal limits. Both lungs are clear. The visualized skeletal structures are unremarkable.  IMPRESSION: No active disease. Electronically Signed   By: Lupita Raider M.D.   On: 11/04/2021 21:51   CT HEAD CODE STROKE  WO CONTRAST  Result Date: 11/04/2021 CLINICAL DATA:  Code stroke. EXAM: CT HEAD WITHOUT CONTRAST TECHNIQUE: Contiguous axial images were obtained from the base of the skull through the vertex without intravenous contrast. COMPARISON:  03/12/2021. FINDINGS: Brain: No acute infarct, hemorrhage, mass, mass effect, or midline shift. Redemonstrated hypodensity in the left frontal lobe and basal ganglia. Redemonstrated chronic infarcts in the right cerebellum, right occipital lobe, and left insula. Periventricular white matter changes, likely the sequela of chronic small vessel ischemic disease. No new area of hypodensity. No extra-axial collection or hydrocephalus. Ex vacuo dilatation of the ventricles. Vascular: No hyperdense vessel. Skull: Normal. Negative for fracture or focal lesion. Sinuses/Orbits: Negative status post bilateral lens replacements. Other: The mastoids are well aerated. ASPECTS [facility] Stroke Program Early CT Score) - Ganglionic level infarction (caudate, lentiform nuclei, internal capsule, insula, M1-M3 cortex): 7 - Supraganglionic infarction (M4-M6 cortex): 3 Total score (0-10 with 10 being normal): 10 IMPRESSION: 1. No acute intracranial process.  Redemonstrated chronic infarcts. 2. ASPECTS is 10 Code stroke imaging results were communicated on 11/04/2021 at 9:18 pm to provider [place] via secure text paging. Electronically Signed   By: Wiliam Ke M.D.   On: 11/04/2021 21:18    Procedures Procedures   Medications Ordered in ED Medications  iohexol (OMNIPAQUE) 350 MG/ML injection 70 mL (70 mLs Intravenous Contrast Given 11/04/21 2138)    ED Course  I have reviewed the triage vital signs and the nursing notes.  Pertinent labs & imaging results that were available during my care of the patient were reviewed by me and considered in my medical decision making  (see chart for details).    MDM Rules/Calculators/A&P                          CYNITHIA HAKIMI is a 75 y.o. female here presenting with altered mental status.  Last normal was 7 PM.  Daughter was with her this whole time.  Patient initially has some expressive aphasia and left-sided weakness. Code stroke was activated when I saw the patient.  Neurology saw patient at bedside  10:37 PM CT did not show any bleed and she had a chronic left M1 occlusion and no obvious LVO.  Patient is improving and now is able to speak.  Neurology thinks is either a TIA versus seizure versus syncope.  He recommend EEG and syncope work-up.  I also ordered MRI brain.  Hospitalist to admit   Final Clinical Impression(s) / ED Diagnoses Final diagnoses:  None    Rx / DC Orders ED Discharge Orders     None        Charlynne Pander, MD 11/04/21 2238

## 2021-11-04 NOTE — ED Notes (Signed)
Patient transported to CT with Primary RN  

## 2021-11-04 NOTE — H&P (Signed)
History and Physical    [name] SWN:[id number] DOB: Feb 08, 1946 DOA: 11/04/2021  PCP: Hoy Register, MD  Patient coming from: Home  I have personally briefly reviewed patient's old medical records in [facility]  Chief Complaint: speech difficulties  HPI: [name] is a 75 y.o. female with medical history significant for multiple CVA with residual expressive aphasia, dementia, Type 2 DM, seizure who presents with worsening confusion and speech difficulties.   Daughter who lives with her provides history.  She reports that patient was otherwise in her normal state of health today.  Did her normal routines including doing crossword puzzle, walking to the mailbox, going with the family to [place].  Then after dinner today around 7 PM patient was sitting on the couch and suddenly told her daughter that something did not look right in front of her eyes and that she felt dizzy.  Patient appeared lightheaded, flushed and kept falling to one side although never had for loss of consciousness. Tried to drink water but could not swallow. Did not track family members with her eyes and appear to have fixed gazed. She then became aphasic and was not able to say anything other than moaning so daughter decided to call EMS. Blood sugar checked was at 177.  At baseline, patient does have some confusion with family names but is usually able to list them and is alert to self and place.  Today she was completely unable to speak.  No urinary or bowel incontinence.  Patient was last admitted in April 2022 with left sided weakness and aphasia. Stroke was ruled out with MRI. EEG was done which showed "evidence of epileptogenicity as well as cortical dysfunction" but no epileptiform discharges. She was started on Keppra but was switched to Lacosamide in August since Keppra was causing increase sleepiness and unsteady gait. Took last dose of Lacosamide this morning.  On route with EMS, she was  noted to be hypoxic down to 80% requiring nonrebreather.  She was having cyanosis of her fingertips which daughter reports has been ongoing intermittently for several months.  Cyanosis is not associated with temperature changes.  Daughter reports gradual improvement once they arrived in the ED.  During the time of my evaluation she was back to her baseline.  Patient told daughter that she was seeing things about a flower blooming.  CT head and CTA head and neck negative other than chronic left M1 occlusion.  CBC unremarkable.  Sodium 140, K of 4, creatinine elevated 1.25, BG of 155.  Review of Systems: Unable to obtain given pt has dementia and difficulty word finding  Social Pt lives with daughter and 2 grandchildren. Continues to smoke but no alcohol or illicit drug use.   Past Medical History:  Diagnosis Date   Diabetes mellitus    H/O: CVA (cerebrovascular accident) 07/08/2014   Hypertension    Stroke [facility])     Past Surgical History:  Procedure Laterality Date   NO PAST SURGERIES        Allergies  Allergen Reactions   Penicillins Itching   Sulfa Antibiotics Other (See Comments)    Reaction not recalled, but patient was told she was allergic    Family History  Problem Relation Age of Onset   Hyperlipidemia Mother    Hypertension Mother    Diabetes Mother    Stroke Father      Prior to Admission medications   Medication Sig Start Date End Date Taking? Authorizing Provider  acetaminophen (TYLENOL)  325 MG tablet Take 650 mg by mouth every 6 (six) hours as needed for pain.    [provider]  aspirin 81 MG chewable tablet Chew 81 mg by mouth in the morning.    [provider]  atenolol (TENORMIN) 25 MG tablet Take1 tablet by mouth daily. 10/12/21 10/12/22  Hoy Register, MD  atorvastatin (LIPITOR) 20 MG tablet Take 1 tablet (20 mg total) by mouth daily. 06/18/21 06/18/22  Hoy Register, MD  Blood Glucose Monitoring Suppl (ACCU-CHEK AVIVA) device  Use as instructed daily. 01/09/20   Hoy Register, MD  clopidogrel (PLAVIX) 75 MG tablet Take1 tablet by mouth daily. 10/13/21 10/13/22  Hoy Register, MD  diclofenac Sodium (VOLTAREN) 1 % GEL Apply 4 g topically 4 (four) times daily. 04/09/21   Hoy Register, MD  glucose blood (TRUE METRIX BLOOD GLUCOSE TEST) test strip Use as instructed 01/09/20   Hoy Register, MD  Lacosamide 100 MG TABS Take 1 tablet (100 mg total) by mouth in the morning and at bedtime. 10/20/21 01/18/22  Windell Norfolk, MD  Lancets (ACCU-CHEK MULTICLIX) lancets Use as instructed 01/09/20   Hoy Register, MD  lisinopril (ZESTRIL) 10 MG tablet TAKE 1 TABLET (10 MG TOTAL) BY MOUTH DAILY. Patient taking differently: Take 10 mg by mouth daily. 01/01/21 01/01/22  Anders Simmonds, PA-C  metFORMIN (GLUCOPHAGE) 500 MG tablet TAKE 2 TABLETS (1,000 MG TOTAL) BY MOUTH 2 (TWO) TIMES DAILY WITH A MEAL. 10/12/21 10/12/22  Hoy Register, MD  traMADol (ULTRAM) 50 MG tablet Take 1 tablet (50 mg total) by mouth at bedtime as needed. For chronic bilateral osteoarthritis 06/18/21   Hoy Register, MD    Physical Exam: Vitals:   11/04/21 2037 11/04/21 2038 11/04/21 2215  BP: (!) 174/94  (!) 121/54  Pulse: 82  73  Resp: 17  16  Temp: 97.8 F (36.6 C)    TempSrc: Axillary    SpO2: 100%  99%  Weight:  83.5 kg   Height:   (1.6 m)     Constitutional: NAD, calm, comfortable, elderly female sitting upright in bed Vitals:   11/04/21 2037 11/04/21 2038 11/04/21 2215  BP: (!) 174/94  (!) 121/54  Pulse: 82  73  Resp: 17  16  Temp: 97.8 F (36.6 C)    TempSrc: Axillary    SpO2: 100%  99%  Weight:  83.5 kg   Height:   (1.6 m)    Eyes: PERRL, lids and conjunctivae normal ENMT: Mucous membranes are moist.  Neck: normal, supple Respiratory: clear to auscultation bilaterally, no wheezing, no crackles.  Cardiovascular: Regular rate and rhythm, no murmurs / rubs / gallops. No extremity edema. Abdomen: no tenderness, Bowel  sounds positive.  Musculoskeletal: no clubbing / cyanosis. No joint deformity upper and lower extremities.  Normal muscle tone.  Skin: cyanosis noted to palmar side of right middle and ring finger.  Neurologic: Alert and oriented to self and place. Able to name daughter at bedside after several attempts which per daughter is her normal.  CN 2-12 grossly intact.  No facial asymmetry.  No nystagmus but required frequent redirection to track eyes to object.  Intact finger-nose.  Has difficulty word finding and has word salad when speaking sentences.  Psychiatric: normal mood    Labs on Admission: I have personally reviewed following labs and imaging studies  CBC: Recent Labs  Lab 11/04/21 2055 11/04/21 2101 11/04/21 2102  WBC 8.6  --   --   NEUTROABS 4.8  --   --  HGB 13.5 14.3 13.9  HCT 42.8 42.0 41.0  MCV 86.5  --   --   PLT 319  --   --    Basic Metabolic Panel: Recent Labs  Lab 11/04/21 2055 11/04/21 2101 11/04/21 2102  NA 140 142 143  K 4.0 3.9 3.9  CL 104 102  --   CO2 27  --   --   GLUCOSE 155* 153*  --   BUN 16 20  --   CREATININE 1.25* 1.10*  --   CALCIUM 9.2  --   --    GFR: Estimated Creatinine Clearance: 46.6 mL/min (A) (by C-G formula based on SCr of 1.1 mg/dL (H)). Liver Function Tests: Recent Labs  Lab 11/04/21 2055  AST 18  ALT 13  ALKPHOS 66  BILITOT 0.3  PROT 6.5  ALBUMIN 3.7   No results for input(s): LIPASE, AMYLASE in the last 168 hours. No results for input(s): AMMONIA in the last 168 hours. Coagulation Profile: Recent Labs  Lab 11/04/21 2055  INR 1.0   Cardiac Enzymes: No results for input(s): CKTOTAL, CKMB, CKMBINDEX, TROPONINI in the last 168 hours. BNP (last 3 results) No results for input(s): PROBNP in the last 8760 hours. HbA1C: No results for input(s): HGBA1C in the last 72 hours. CBG: No results for input(s): GLUCAP in the last 168 hours. Lipid Profile: No results for input(s): CHOL, HDL, LDLCALC, TRIG, CHOLHDL, LDLDIRECT  in the last 72 hours. Thyroid Function Tests: No results for input(s): TSH, T4TOTAL, FREET4, T3FREE, THYROIDAB in the last 72 hours. Anemia Panel: No results for input(s): VITAMINB12, FOLATE, FERRITIN, TIBC, IRON, RETICCTPCT in the last 72 hours. Urine analysis:    Component Value Date/Time   COLORURINE YELLOW 03/13/2021 0745   APPEARANCEUR CLEAR 03/13/2021 0745   LABSPEC 1.031 (H) 03/13/2021 0745   PHURINE 5.0 03/13/2021 0745   GLUCOSEU NEGATIVE 03/13/2021 0745   HGBUR NEGATIVE 03/13/2021 0745   BILIRUBINUR NEGATIVE 03/13/2021 0745   KETONESUR NEGATIVE 03/13/2021 0745   PROTEINUR NEGATIVE 03/13/2021 0745   UROBILINOGEN 1.0 09/13/2015 1455   NITRITE NEGATIVE 03/13/2021 0745   LEUKOCYTESUR NEGATIVE 03/13/2021 0745    Radiological Exams on Admission: CT ANGIO HEAD NECK W WO CM  Result Date: 11/04/2021 CLINICAL DATA:  Stroke/TIA, determinate embolic source EXAM: CT ANGIOGRAPHY HEAD AND NECK TECHNIQUE: Multidetector CT imaging of the head and neck was performed using the standard protocol during bolus administration of intravenous contrast. Multiplanar CT image reconstructions and MIPs were obtained to evaluate the vascular anatomy. Carotid stenosis measurements (when applicable) are obtained utilizing NASCET criteria, using the distal internal carotid diameter as the denominator. CONTRAST:  58mL OMNIPAQUE IOHEXOL 350 MG/ML SOLN COMPARISON:  03/12/2021 CTA. FINDINGS: CT HEAD FINDINGS For noncontrast findings, please see same day CT head. CTA NECK FINDINGS Aortic arch: Standard branching. Imaged portion shows no evidence of aneurysm or dissection. No significant stenosis of the major arch vessel origins. Plaque at the origins of the arch vessels is not hemodynamically significant, worst at the left subclavian origin. Right carotid system: No evidence of dissection, stenosis (50% or greater) or occlusion. Noncalcified plaque at the origin of the left ICA is not hemodynamically significant.  Retropharyngeal course of the right CCA. Left carotid system: No evidence of dissection, stenosis (50% or greater) or occlusion. The left common and internal carotid artery are relatively diminutive. Vertebral arteries: Codominant. No evidence of dissection, stenosis (50% or greater) or occlusion. Skeleton: Degenerative changes in the cervical spine. No acute osseous abnormality. Other neck: Negative. Upper  chest: No focal pulmonary opacity or pleural effusion. Review of the MIP images confirms the above findings CTA HEAD FINDINGS Anterior circulation: Both internal carotid arteries are patent to the termini, with calcified plaque causing mild stenosis. A1 segments patent. Normal anterior communicating artery. Anterior cerebral arteries are patent to their distal aspects. Redemonstrated occlusion of the left MCA, which appears chronic, with redemonstrated collaterals. Minimal flow in the distal left MCA branches. The right M1 is patent with mild narrowing distally. More diminutive M2 and distal MCA branches, without focal occlusion or stenosis. Posterior circulation: Vertebral arteries patent to the vertebrobasilar junction without stenosis. Posterior inferior cerebral arteries patent bilaterally. Basilar patent to its distal aspect. Superior cerebellar arteries patent bilaterally. PCAs perfused to their distal aspects without stenosis. The bilateral posterior communicating arteries are not visualized. Venous sinuses: As permitted by contrast timing, patent. Anatomic variants: None significant Review of the MIP images confirms the above findings IMPRESSION: 1. 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 2. Patent right M1, with distal narrowing and diminutive right M2 and distal MCA branches, without focal occlusion or stenosis, unchanged. 3. No hemodynamically significant stenosis in the neck. Electronically Signed   By: Wiliam Ke M.D.   On:  11/04/2021 22:07   DG Chest Port 1 View  Result Date: 11/04/2021 CLINICAL DATA:  Shortness of breath. EXAM: PORTABLE CHEST 1 VIEW COMPARISON:  March 12, 2021. FINDINGS: The heart size and mediastinal contours are within normal limits. Both lungs are clear. The visualized skeletal structures are unremarkable. IMPRESSION: No active disease. Electronically Signed   By: Lupita Raider M.D.   On: 11/04/2021 21:51   CT HEAD CODE STROKE WO CONTRAST  Result Date: 11/04/2021 CLINICAL DATA:  Code stroke. EXAM: CT HEAD WITHOUT CONTRAST TECHNIQUE: Contiguous axial images were obtained from the base of the skull through the vertex without intravenous contrast. COMPARISON:  03/12/2021. FINDINGS: Brain: No acute infarct, hemorrhage, mass, mass effect, or midline shift. Redemonstrated hypodensity in the left frontal lobe and basal ganglia. Redemonstrated chronic infarcts in the right cerebellum, right occipital lobe, and left insula. Periventricular white matter changes, likely the sequela of chronic small vessel ischemic disease. No new area of hypodensity. No extra-axial collection or hydrocephalus. Ex vacuo dilatation of the ventricles. Vascular: No hyperdense vessel. Skull: Normal. Negative for fracture or focal lesion. Sinuses/Orbits: Negative status post bilateral lens replacements. Other: The mastoids are well aerated. ASPECTS [facility] Stroke Program Early CT Score) - Ganglionic level infarction (caudate, lentiform nuclei, internal capsule, insula, M1-M3 cortex): 7 - Supraganglionic infarction (M4-M6 cortex): 3 Total score (0-10 with 10 being normal): 10 IMPRESSION: 1. No acute intracranial process.  Redemonstrated chronic infarcts. 2. ASPECTS is 10 Code stroke imaging results were communicated on 11/04/2021 at 9:18 pm to provider [place] via secure text paging. Electronically Signed   By: Wiliam Ke M.D.   On: 11/04/2021 21:18      Assessment/Plan  Worsening expressive aphasia Hx of CVA pt with hx of  multiple strokes in the past with residual expressive aphasia. Confusion worse today when she was completely unable to speak but back to baseline in ED so not a candidate for IV tPA. She reports visual aura which is more concerning for seizure activity.  CT head and CTA head and neck so far negative for acute findings. Shows chronic M1 occlusion -neuro recommends syncope vs seizure workup -MRI brain pending -check orthostatic  - continue daily aspirin, plavix and atorvastatin -Obtain A1c  and lipids -PT/SLT -Frequent neuro checks and keep on telemetry -Hold atenolol and lisinopril for now pending MRI results  Hx of seizure Patient was last admitted in April 2022 with left sided weakness and aphasia. Stroke was ruled out with MRI. EEG was done which showed "evidence of epileptogenicity as well as cortical dysfunction" but no epileptiform discharges.  -previously on Keppra but switch to Lacosamide in August due to increase sleepiness and unsteady gait. Check Lacosamide level and continue.  -video EEG overnight per neurology  AKI Creatinine of 1.25 from prior of 0.93 give 500cc LR bolus and follow repeat Cr tomorrow   Hypertension Holding atenolol and lisinopril for now pending MRI  Type 2 DM Last HbA1C at 8.4 in 02/2021-will get repeat place on low dose SSI    DVT prophylaxis:.Lovenox Code Status: Full Family Communication: Plan discussed with patient at bedside  disposition Plan: Home with observation Consults called: Neurology Admission status: Observation  Level of care: Telemetry Medical  Status is: Observation  The patient remains OBS appropriate and will d/c before 2 midnights.        Anselm Jungling DO [facility]   If 7PM-7AM, please contact night-coverage [email or web address]   11/04/2021, 11:30 PM

## 2021-11-04 NOTE — Consult Note (Signed)
Neurology Consultation  Reason for Consult: Code Stroke Referring Physician: Shirlyn Goltz, MD  CC: AMS  History is obtained from: Pt's daughter  HPI: [name] is a 75 y.o. female with hx of multiple strokes since age 33, seizure d/o on vimpat, dementia who presented to the ED with AMS. Code stroke was called for aphasia.  LKW 1900.  NIHSS 2. Head CT was negative for acute process. CT angio neg for acute pathology.  Pt was not a candidate for IV thrombolytic due to rapidly improving symptoms.  Pt reports feeling flushed and unwell after using the bathroom.  Pt's exam greatly improved after CT.  LKW: 1900 tpa given?: no, rapidly improving symptoms  NIHSS 1a Level of Conscious.:  1b LOC Questions:  1c LOC Commands:  2 Best Gaze:  3 Visual:  4 Facial Palsy:  5a Motor Arm - left:  5b Motor Arm - Right:  6a Motor Leg - Left:  6b Motor Leg - Right:  7 Limb Ataxia:  8 Sensory:  9 Best Language:  10 Dysarthria:  11 Extinct. and Inatten.:  TOTAL: 2   Past Medical History:  Diagnosis Date   Diabetes mellitus    H/O: CVA (cerebrovascular accident) 07/08/2014   Hypertension    Stroke [facility])    Family History  Problem Relation Age of Onset   Hyperlipidemia Mother    Hypertension Mother    Diabetes Mother    Stroke Father     Social History:   reports that she has been smoking cigarettes. She has a 48.00 pack-year smoking history. She has never used smokeless tobacco. She reports that she does not drink alcohol and does not use drugs.  Medications No current facility-administered medications for this encounter.  Current Outpatient Medications:    acetaminophen (TYLENOL) 325 MG tablet, Take 650 mg by mouth every 6 (six) hours as needed for pain., Disp: , Rfl:    aspirin 81 MG chewable tablet, Chew 81 mg by mouth in the morning., Disp: , Rfl:    atenolol (TENORMIN) 25 MG tablet, Take1 tablet by mouth daily., Disp: 30 tablet, Rfl: 2   atorvastatin (LIPITOR) 20 MG tablet,  Take 1 tablet (20 mg total) by mouth daily., Disp: 30 tablet, Rfl: 6   Blood Glucose Monitoring Suppl (ACCU-CHEK AVIVA) device, Use as instructed daily., Disp: 1 each, Rfl: 0   clopidogrel (PLAVIX) 75 MG tablet, Take1 tablet by mouth daily., Disp: 30 tablet, Rfl: 0   diclofenac Sodium (VOLTAREN) 1 % GEL, Apply 4 g topically 4 (four) times daily., Disp: 100 g, Rfl: 1   glucose blood (TRUE METRIX BLOOD GLUCOSE TEST) test strip, Use as instructed, Disp: 100 each, Rfl: 6   Lacosamide 100 MG TABS, Take 1 tablet (100 mg total) by mouth in the morning and at bedtime., Disp: 180 tablet, Rfl: 4   Lancets (ACCU-CHEK MULTICLIX) lancets, Use as instructed, Disp: 100 each, Rfl: 12   lisinopril (ZESTRIL) 10 MG tablet, TAKE 1 TABLET (10 MG TOTAL) BY MOUTH DAILY. (Patient taking differently: Take 10 mg by mouth daily.), Disp: 90 tablet, Rfl: 3   metFORMIN (GLUCOPHAGE) 500 MG tablet, TAKE 2 TABLETS (1,000 MG TOTAL) BY MOUTH 2 (TWO) TIMES DAILY WITH A MEAL., Disp: 120 tablet, Rfl: 0   traMADol (ULTRAM) 50 MG tablet, Take 1 tablet (50 mg total) by mouth at bedtime as needed. For chronic bilateral osteoarthritis, Disp: 30 tablet, Rfl: 1  ROS:   General ROS: negative for - chills, fatigue, fever, night sweats, weight gain  or weight loss Psychological ROS: negative for - behavioral disorder, hallucinations, memory difficulties, mood swings or suicidal ideation Ophthalmic ROS: negative for - blurry vision, double vision, eye pain or loss of vision ENT ROS: negative for - epistaxis, nasal discharge, oral lesions, sore throat, tinnitus or vertigo Allergy and Immunology ROS: negative for - hives or itchy/watery eyes Hematological and Lymphatic ROS: negative for - bleeding problems, bruising or swollen lymph nodes Endocrine ROS: negative for - galactorrhea, hair pattern changes, polydipsia/polyuria or temperature intolerance Respiratory ROS: negative for - cough, hemoptysis, shortness of breath or wheezing Cardiovascular  ROS: negative for - chest pain, dyspnea on exertion, edema or irregular heartbeat Gastrointestinal ROS: negative for - abdominal pain, diarrhea, hematemesis, nausea/vomiting or stool incontinence Genito-Urinary ROS: negative for - dysuria, hematuria, incontinence or urinary frequency/urgency Musculoskeletal ROS: negative for - joint swelling or muscular weakness Neurological ROS: as noted in HPI Dermatological ROS: negative for rash and skin lesion changes  Exam: Current vital signs: BP (!) 121/54    Pulse 73    Temp 97.8 F (36.6 C) (Axillary)    Resp 16    Ht 5\' 3"  (1.6 m)    Wt 83.5 kg    LMP 09/28/2014 (Approximate)    SpO2 99%    BMI 32.59 kg/m  Vital signs in last 24 hours: Temp:  [97.8 F (36.6 C)] 97.8 F (36.6 C) (12/14 2037) Pulse Rate:  [73-82] 73 (12/14 2215) Resp:  [16-17] 16 (12/14 2215) BP: (121-174)/(54-94) 121/54 (12/14 2215) SpO2:  [99 %-100 %] 99 % (12/14 2215) Weight:  [83.5 kg] 83.5 kg (12/14 2038)   Constitutional: Appears well-developed and well-nourished.  Psych: Affect appropriate to situation Eyes: No scleral injection HENT: No OP obstrucion Head: Normocephalic.  Cardiovascular: Normal rate and regular rhythm.  Respiratory: Effort normal, non-labored breathing GI: Soft.  No distension. There is no tenderness.  Skin: WDI  Neuro: AAOx3, fluent speech, mild dysarthria, follows commands EOMI, PERRL, no facial asymmetry Tongue and palate midline Nl bulk and tone, no drift Moves all ext antigravity Sensation grossly intact  Labs  Reviewed  CBC    Component Value Date/Time   WBC 8.6 11/04/2021 2055   RBC 4.95 11/04/2021 2055   HGB 13.9 11/04/2021 2102   HGB 13.9 01/01/2021 1647   HCT 41.0 11/04/2021 2102   HCT 42.2 01/01/2021 1647   PLT 319 11/04/2021 2055   PLT 331 01/01/2021 1647   MCV 86.5 11/04/2021 2055   MCV 84 01/01/2021 1647   MCH 27.3 11/04/2021 2055   MCHC 31.5 11/04/2021 2055   RDW 14.1 11/04/2021 2055   RDW 14.1 01/01/2021  1647   LYMPHSABS 3.2 11/04/2021 2055   LYMPHSABS 3.1 01/01/2021 1647   MONOABS 0.5 11/04/2021 2055   EOSABS 0.1 11/04/2021 2055   EOSABS 0.1 01/01/2021 1647   BASOSABS 0.1 11/04/2021 2055   BASOSABS 0.1 01/01/2021 1647    CMP     Component Value Date/Time   NA 143 11/04/2021 2102   NA 142 01/01/2021 1647   K 3.9 11/04/2021 2102   CL 102 11/04/2021 2101   CO2 27 11/04/2021 2055   GLUCOSE 153 (H) 11/04/2021 2101   BUN 20 11/04/2021 2101   BUN 9 01/01/2021 1647   CREATININE 1.10 (H) 11/04/2021 2101   CALCIUM 9.2 11/04/2021 2055   PROT 6.5 11/04/2021 2055   PROT 6.6 01/01/2021 1647   ALBUMIN 3.7 11/04/2021 2055   ALBUMIN 4.2 01/01/2021 1647   AST 18 11/04/2021 2055   ALT 13 11/04/2021  2055   ALKPHOS 66 11/04/2021 2055   BILITOT 0.3 11/04/2021 2055   BILITOT 0.3 01/01/2021 1647   GFRNONAA 46 (L) 11/04/2021 2055   GFRAA 65 01/01/2021 1647    Lipid Panel     Component Value Date/Time   CHOL 132 03/13/2021 0456   TRIG 105 03/13/2021 0456   HDL 41 03/13/2021 0456   CHOLHDL 3.2 03/13/2021 0456   VLDL 21 03/13/2021 0456   LDLCALC 70 03/13/2021 0456     Imaging  Reviewed  Assessment: 75 yo F with hx strokes, seizure d/o on keppra p/w AMS  Impression:Syncope vs Seizure  Recommendations: Cont vimpat 100mg  bid VEEG monitoring Syncope workup Neuro will cont to follow  Total time spent 59min  Ray Church, MD Attending Neurologist

## 2021-11-04 NOTE — ED Notes (Signed)
Pt back from CT. Pt able to verbalize more compared to baseline presentation. Patient more alert at this time. Able to verbalize where she is

## 2021-11-04 NOTE — ED Notes (Signed)
MD Silverio Lay at bedside, initiated Code Stroke.

## 2021-11-05 ENCOUNTER — Encounter (HOSPITAL_COMMUNITY): Payer: Self-pay | Admitting: Internal Medicine

## 2021-11-05 ENCOUNTER — Observation Stay (HOSPITAL_COMMUNITY): Payer: Medicare Other

## 2021-11-05 DIAGNOSIS — Z7902 Long term (current) use of antithrombotics/antiplatelets: Secondary | ICD-10-CM | POA: Diagnosis not present

## 2021-11-05 DIAGNOSIS — I10 Essential (primary) hypertension: Secondary | ICD-10-CM | POA: Diagnosis present

## 2021-11-05 DIAGNOSIS — R471 Dysarthria and anarthria: Secondary | ICD-10-CM | POA: Diagnosis present

## 2021-11-05 DIAGNOSIS — Z20822 Contact with and (suspected) exposure to covid-19: Secondary | ICD-10-CM | POA: Diagnosis present

## 2021-11-05 DIAGNOSIS — G928 Other toxic encephalopathy: Secondary | ICD-10-CM | POA: Diagnosis present

## 2021-11-05 DIAGNOSIS — R0902 Hypoxemia: Secondary | ICD-10-CM | POA: Diagnosis present

## 2021-11-05 DIAGNOSIS — R4701 Aphasia: Secondary | ICD-10-CM

## 2021-11-05 DIAGNOSIS — R569 Unspecified convulsions: Secondary | ICD-10-CM | POA: Diagnosis not present

## 2021-11-05 DIAGNOSIS — Z83438 Family history of other disorder of lipoprotein metabolism and other lipidemia: Secondary | ICD-10-CM | POA: Diagnosis not present

## 2021-11-05 DIAGNOSIS — Z833 Family history of diabetes mellitus: Secondary | ICD-10-CM | POA: Diagnosis not present

## 2021-11-05 DIAGNOSIS — Z7982 Long term (current) use of aspirin: Secondary | ICD-10-CM | POA: Diagnosis not present

## 2021-11-05 DIAGNOSIS — N179 Acute kidney failure, unspecified: Secondary | ICD-10-CM | POA: Diagnosis present

## 2021-11-05 DIAGNOSIS — E538 Deficiency of other specified B group vitamins: Secondary | ICD-10-CM | POA: Diagnosis present

## 2021-11-05 DIAGNOSIS — E669 Obesity, unspecified: Secondary | ICD-10-CM | POA: Diagnosis present

## 2021-11-05 DIAGNOSIS — R4182 Altered mental status, unspecified: Secondary | ICD-10-CM | POA: Diagnosis present

## 2021-11-05 DIAGNOSIS — Z823 Family history of stroke: Secondary | ICD-10-CM | POA: Diagnosis not present

## 2021-11-05 DIAGNOSIS — D519 Vitamin B12 deficiency anemia, unspecified: Secondary | ICD-10-CM | POA: Diagnosis not present

## 2021-11-05 DIAGNOSIS — M25552 Pain in left hip: Secondary | ICD-10-CM | POA: Diagnosis present

## 2021-11-05 DIAGNOSIS — G40909 Epilepsy, unspecified, not intractable, without status epilepticus: Secondary | ICD-10-CM | POA: Diagnosis present

## 2021-11-05 DIAGNOSIS — I69320 Aphasia following cerebral infarction: Secondary | ICD-10-CM | POA: Diagnosis not present

## 2021-11-05 DIAGNOSIS — F17210 Nicotine dependence, cigarettes, uncomplicated: Secondary | ICD-10-CM | POA: Diagnosis present

## 2021-11-05 DIAGNOSIS — E119 Type 2 diabetes mellitus without complications: Secondary | ICD-10-CM | POA: Diagnosis present

## 2021-11-05 DIAGNOSIS — Z79899 Other long term (current) drug therapy: Secondary | ICD-10-CM | POA: Diagnosis not present

## 2021-11-05 DIAGNOSIS — Z8249 Family history of ischemic heart disease and other diseases of the circulatory system: Secondary | ICD-10-CM | POA: Diagnosis not present

## 2021-11-05 DIAGNOSIS — Z7984 Long term (current) use of oral hypoglycemic drugs: Secondary | ICD-10-CM | POA: Diagnosis not present

## 2021-11-05 HISTORY — DX: Unspecified convulsions: R56.9

## 2021-11-05 HISTORY — DX: Acute kidney failure, unspecified: N17.9

## 2021-11-05 LAB — TSH: TSH: 1.497 u[IU]/mL (ref 0.350–4.500)

## 2021-11-05 LAB — URINALYSIS, ROUTINE W REFLEX MICROSCOPIC
Bacteria, UA: NONE SEEN
Bilirubin Urine: NEGATIVE
Glucose, UA: NEGATIVE mg/dL
Hgb urine dipstick: NEGATIVE
Ketones, ur: NEGATIVE mg/dL
Leukocytes,Ua: NEGATIVE
Nitrite: NEGATIVE
Protein, ur: NEGATIVE mg/dL
Specific Gravity, Urine: 1.01 (ref 1.005–1.030)
pH: 5.5 (ref 5.0–8.0)

## 2021-11-05 LAB — RAPID URINE DRUG SCREEN, HOSP PERFORMED
Amphetamines: NOT DETECTED
Barbiturates: NOT DETECTED
Benzodiazepines: NOT DETECTED
Cocaine: NOT DETECTED
Opiates: NOT DETECTED
Tetrahydrocannabinol: POSITIVE — AB

## 2021-11-05 LAB — HEMOGLOBIN A1C
Hgb A1c MFr Bld: 7.3 % — ABNORMAL HIGH (ref 4.8–5.6)
Mean Plasma Glucose: 162.81 mg/dL

## 2021-11-05 LAB — LIPID PANEL
Cholesterol: 110 mg/dL (ref 0–200)
HDL: 39 mg/dL — ABNORMAL LOW (ref >40)
LDL Cholesterol: 50 mg/dL (ref 0–99)
Total CHOL/HDL Ratio: 2.8 RATIO
Triglycerides: 103 mg/dL (ref <150)
VLDL: 21 mg/dL (ref 0–40)

## 2021-11-05 LAB — GLUCOSE, CAPILLARY
Glucose-Capillary: 99 mg/dL (ref 70–99)
Glucose-Capillary: 100 mg/dL — ABNORMAL HIGH (ref 70–99)

## 2021-11-05 LAB — VITAMIN B12: Vitamin B-12: 167 pg/mL — ABNORMAL LOW (ref 180–914)

## 2021-11-05 LAB — CBG MONITORING, ED
Glucose-Capillary: 132 mg/dL — ABNORMAL HIGH (ref 70–99)
Glucose-Capillary: 133 mg/dL — ABNORMAL HIGH (ref 70–99)

## 2021-11-05 LAB — RESP PANEL BY RT-PCR (FLU A&B, COVID) ARPGX2
Influenza A by PCR: NEGATIVE
Influenza B by PCR: NEGATIVE
SARS Coronavirus 2 by RT PCR: NEGATIVE

## 2021-11-05 LAB — AMMONIA: Ammonia: 23 umol/L (ref 9–35)

## 2021-11-05 MED ORDER — INSULIN ASPART 100 UNIT/ML IJ SOLN
0.0000 [IU] | Freq: Three times a day (TID) | INTRAMUSCULAR | Status: DC
  Administered 2021-11-06: 1 [IU] via SUBCUTANEOUS

## 2021-11-05 MED ORDER — CLOPIDOGREL BISULFATE 75 MG PO TABS
75.0000 mg | ORAL_TABLET | Freq: Every day | ORAL | Status: DC
  Administered 2021-11-06: 75 mg via ORAL
  Filled 2021-11-05: qty 1

## 2021-11-05 MED ORDER — SODIUM CHLORIDE 0.9 % IV SOLN: INTRAVENOUS | Status: DC

## 2021-11-05 MED ORDER — FOLIC ACID 1 MG PO TABS
1.0000 mg | ORAL_TABLET | Freq: Every day | ORAL | Status: DC
Start: 2021-11-05 — End: 2021-11-06
  Administered 2021-11-05 – 2021-11-06 (×2): 1 mg via ORAL
  Filled 2021-11-05 (×2): qty 1

## 2021-11-05 MED ORDER — CYANOCOBALAMIN 1000 MCG/ML IJ SOLN
1000.0000 ug | Freq: Every day | INTRAMUSCULAR | Status: DC
  Administered 2021-11-05 – 2021-11-06 (×2): 1000 ug via INTRAMUSCULAR
  Filled 2021-11-05 (×2): qty 1

## 2021-11-05 MED ORDER — LACOSAMIDE 50 MG PO TABS
100.0000 mg | ORAL_TABLET | Freq: Two times a day (BID) | ORAL | Status: DC
  Administered 2021-11-05 – 2021-11-06 (×3): 100 mg via ORAL
  Filled 2021-11-05 (×3): qty 2

## 2021-11-05 MED ORDER — ASPIRIN 81 MG PO CHEW
81.0000 mg | CHEWABLE_TABLET | Freq: Every morning | ORAL | Status: DC
  Administered 2021-11-05 – 2021-11-06 (×2): 81 mg via ORAL
  Filled 2021-11-05 (×2): qty 1

## 2021-11-05 MED ORDER — VITAMIN B-12 100 MCG PO TABS: 500.0000 ug | ORAL_TABLET | Freq: Every day | ORAL | Status: DC

## 2021-11-05 MED ORDER — ATORVASTATIN CALCIUM 10 MG PO TABS
20.0000 mg | ORAL_TABLET | Freq: Every day | ORAL | Status: DC
  Administered 2021-11-06: 20 mg via ORAL
  Filled 2021-11-05: qty 2

## 2021-11-05 MED ORDER — LACTATED RINGERS IV BOLUS
500.0000 mL | Freq: Once | INTRAVENOUS | Status: AC
  Administered 2021-11-05: 500 mL via INTRAVENOUS

## 2021-11-05 NOTE — Progress Notes (Signed)
EEG complete - results pending 

## 2021-11-05 NOTE — Progress Notes (Signed)
Neurology Progress Note  S: Repeats "I'm tired". Denies pain.   O: Current vital signs: BP (!) 133/52    Pulse 84    Temp 98.6 F (37 C) (Axillary)    Resp 15    Ht 5\' 3"  (1.6 m)    Wt 83.5 kg    LMP 09/28/2014 (Approximate)    SpO2 97%    BMI 32.59 kg/m  Vital signs in last 24 hours: Temp:  [97.8 F (36.6 C)-98.6 F (37 C)] 98.6 F (37 C) (12/15 0800) Pulse Rate:  [63-90] 84 (12/15 0815) Resp:  [0-21] 15 (12/15 0800) BP: (110-174)/(42-94) 133/52 (12/15 0815) SpO2:  [94 %-100 %] 97 % (12/15 0815) Weight:  [83.5 kg] 83.5 kg (12/14 2038)  GENERAL: Fairly well appearing female with her eyes closed, but with coaching will open eyes. NAD. HEENT: Normocephalic and atraumatic. LUNGS: Normal respiratory effort.  CV: RRR on tele.  Ext: warm.  NEURO:  Mental Status: Wakes up with coaching, but immediately closes eyes. She will follow commands but on questions, she will just say she is tired. Oriented to self, hospital. Not oriented to day, date, month, or year.   Speech/Language: . She will name objects, but does not tell NP their purpose. Repetition of simple sentences and comprehension to simple commands intact. Fluency is mildly impaired  Cranial Nerves:  II: PERRL. 2039.  III, IV, VI: EOMI. Eyelids elevate symmetrically.  V: Sensation is intact to light touch and symmetrical to face.  VII: Smile is symmetrical.  VIII: hearing intact to voice. IX, X: Palate elevates symmetrically. Phonation is normal.  Control and instrumentation engineer shrug 5/5. XII: tongue is midline without fasciculations. Motor:  5/5 throughout.  Tone: is normal and bulk is normal. Sensation- Intact to light touch bilaterally. Extinction absent to DSS.    Coordination: FTN intact bilaterally. No drift.  DTRs:  2+ bilateral brachioradialis and patella.  Gait- deferred.  Medications  Current Facility-Administered Medications:    acetaminophen (TYLENOL) tablet 650 mg, 650 mg, Oral, Q4H PRN **OR** acetaminophen  (TYLENOL) 160 MG/5ML solution 650 mg, 650 mg, Per Tube, Q4H PRN **OR** acetaminophen (TYLENOL) suppository 650 mg, 650 mg, Rectal, Q4H PRN, Tu, Ching T, DO   aspirin chewable tablet 81 mg, 81 mg, Oral, q AM, Tu, Ching T, DO, 81 mg at 11/05/21 0611   [START ON 11/06/2021] atorvastatin (LIPITOR) tablet 20 mg, 20 mg, Oral, Daily, Tu, Ching T, DO   [START ON 11/06/2021] clopidogrel (PLAVIX) tablet 75 mg, 75 mg, Oral, Daily, Tu, Ching T, DO   enoxaparin (LOVENOX) injection 40 mg, 40 mg, Subcutaneous, Q24H, Tu, Ching T, DO, 40 mg at 11/05/21 11/07/21   insulin aspart (novoLOG) injection 0-9 Units, 0-9 Units, Subcutaneous, TID WC, Tu, Ching T, DO   lacosamide (VIMPAT) tablet 100 mg, 100 mg, Oral, BID, Tu, Ching T, DO   senna-docusate (Senokot-S) tablet 1 tablet, 1 tablet, Oral, QHS PRN, Tu, Ching T, DO  Current Outpatient Medications:    acetaminophen (TYLENOL) 325 MG tablet, Take 650 mg by mouth every 6 (six) hours as needed for pain., Disp: , Rfl:    aspirin 81 MG chewable tablet, Chew 81 mg by mouth in the morning., Disp: , Rfl:    atenolol (TENORMIN) 25 MG tablet, Take1 tablet by mouth daily. (Patient taking differently: Take 25 mg by mouth daily.), Disp: 30 tablet, Rfl: 2   atorvastatin (LIPITOR) 20 MG tablet, Take 1 tablet (20 mg total) by mouth daily., Disp: 30 tablet, Rfl: 6   clopidogrel (PLAVIX) 75  MG tablet, Take1 tablet by mouth daily. (Patient taking differently: Take 75 mg by mouth daily.), Disp: 30 tablet, Rfl: 0   diclofenac Sodium (VOLTAREN) 1 % GEL, Apply 4 g topically 4 (four) times daily. (Patient taking differently: Apply 4 g topically 4 (four) times daily as needed (arthritis pain).), Disp: 100 g, Rfl: 1   Lacosamide 100 MG TABS, Take 1 tablet (100 mg total) by mouth in the morning and at bedtime., Disp: 180 tablet, Rfl: 4   lisinopril (ZESTRIL) 10 MG tablet, TAKE 1 TABLET (10 MG TOTAL) BY MOUTH DAILY. (Patient taking differently: Take 10 mg by mouth daily.), Disp: 90 tablet, Rfl: 3    metFORMIN (GLUCOPHAGE) 500 MG tablet, TAKE 2 TABLETS (1,000 MG TOTAL) BY MOUTH 2 (TWO) TIMES DAILY WITH A MEAL. (Patient taking differently: Take 1,000 mg by mouth 2 (two) times daily with a meal.), Disp: 120 tablet, Rfl: 0   traMADol (ULTRAM) 50 MG tablet, Take 1 tablet (50 mg total) by mouth at bedtime as needed. For chronic bilateral osteoarthritis (Patient taking differently: Take 50 mg by mouth at bedtime as needed (chronic bilateral osteoarthritis).), Disp: 30 tablet, Rfl: 1   Blood Glucose Monitoring Suppl (ACCU-CHEK AVIVA) device, Use as instructed daily., Disp: 1 each, Rfl: 0   glucose blood (TRUE METRIX BLOOD GLUCOSE TEST) test strip, Use as instructed, Disp: 100 each, Rfl: 6   Lancets (ACCU-CHEK MULTICLIX) lancets, Use as instructed, Disp: 100 each, Rfl: 12  Pertinent Labs:  LDL 50. HbA1c 7.3. UDS + THC.   Imaging   CT Head -No acute intracranial process.  Redemonstrated chronic infarcts. -ASPECTS is 10   MRI Brain  -No acute intracranial process. -Re demonstrated sequela of multiple cortical and deep gray infarcts bilaterally and advanced small vessel ischemic disease.   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 -Patent right M1, with distal narrowing and diminutive right M2 and distal MCA branches, without focal occlusion or stenosis, unchanged. -No hemodynamically significant stenosis in the neck.   EEG Pending.   Assessment: 75 yo female who presented to ED with AMS. Code stroke was called for aphasia and imaging was negative for acute finding. Prior h/o multiple strokes with residual speech deficits-question recrudescence of old symptoms. Given her history of seizures, this is on the differential. EEG to be done today. Her Lacosamide level on past labs in chart was very low, so there is a question of her taking her medication.  Family reports compliance to medication. Speech is now  baseline with mild word finding difficulty.  Impression: -AMS-recrudescence of old stroke symptoms versus syncope versus seizure -UDS + for THC.   Recommendations/Plan:  -f/up rEEG. -Continue Vimpat 100mg  po bid. already receiving medication during this hospitalization-we will not check level inpatient-levels can be rechecked outpatient. -Check TSH, ammonia level, Vit B12.  -Hold sedative medications.  -Syncope w/up per medicine team.   Pt seen by , MSN, APN-BC/Nurse Practitioner/Neuro and later by MD. Note and plan to be edited as needed by MD.  Pager: [name]  Attending Neurohospitalist Addendum Patient seen and examined with APP/Resident. Agree with the history and physical as documented above. Agree with the plan as documented, which I helped formulate. I have independently reviewed the chart, obtained history, review of systems and examined the patient.I have personally reviewed pertinent head/neck/spine imaging (CT/MRI). Please feel free to call with any questions.  -- [phone], MD Neurologist [facility] Pager: [phone]

## 2021-11-05 NOTE — ED Notes (Signed)
EEG at bedside.

## 2021-11-05 NOTE — Evaluation (Signed)
Physical Therapy Evaluation Patient Details Name: [name] MRN: [id number] DOB: March 12, 1946 Today's Date: 11/05/2021  History of Present Illness  Pt presented to ED on 12/14 with AMS and code stroke called for aphasia. CT and MRI negative for acute changes.  PMH - multiple CVA with residual expressive aphasia, dementia, HTN, DM, seizure.  Clinical Impression  Pt presents to PT with decreased mobility due to episodes of decr awareness, decreased strength, decreased balance, and decreased functional activity tolerance. Pt has good support at home so will likely need HHPT at DC. Pt with little verbalizations during initial part of evaluation. During ambulation pt began having increased difficulty moving and then stopped moving and appeared "frozen" in place. Turned rollator around for pt to sit on seat and pt with difficulty sitting down and required myself and pt's daughter to have her sit down. Pt with eyes open and didn't appear syncopal. Rolled back to her room. BP checked and it was 160's/ 60's. Pt was speaking some. Transferred back to stretcher. Pt's daughter reports similar episode at home. Also documented was very similar episode during PT when pt hospitalized in April.         Recommendations for follow up therapy are one component of a multi-disciplinary discharge planning process, led by the attending physician.  Recommendations may be updated based on patient status, additional functional criteria and insurance authorization.  Follow Up Recommendations Home health PT    Assistance Recommended at Discharge Frequent or constant Supervision/Assistance  Functional Status Assessment Patient has had a recent decline in their functional status and demonstrates the ability to make significant improvements in function in a reasonable and predictable amount of time.  Equipment Recommendations  None recommended by PT    Recommendations for Other Services       Precautions /  Restrictions Precautions Precautions: Fall;Other (comment) Precaution Comments: Pt with decr awareness during ambulation      Mobility  Bed Mobility Overal bed mobility: Needs Assistance Bed Mobility: Sit to Supine       Sit to supine: Min assist   General bed mobility comments: Assist to bring legs up onto bed.    Transfers Overall transfer level: Needs assistance Equipment used: 1 person hand held assist;2 person hand held assist;Rollator (4 wheels) Transfers: Sit to/from Stand;Bed to chair/wheelchair/BSC Sit to Stand: Min assist;Mod assist   Step pivot transfers: +2 physical assistance;Min assist       General transfer comment: Assist to bring hips up from surface. Min assist from elevated surface and mod assist from low surface. Seat of rollator to stretcher with 2 person assist for balance and support.    Ambulation/Gait Ambulation/Gait assistance: Min assist;Mod assist Gait Distance (Feet): 40 Feet Assistive device: Rollator (4 wheels) Gait Pattern/deviations: Step-through pattern;Decreased step length - right;Decreased step length - left Gait velocity: decr Gait velocity interpretation: 1.31 - 2.62 ft/sec, indicative of limited community ambulator   General Gait Details: Initially min assist for balance and support as pt slightly unsteady. Pt then slowed and when asked nodded that she was dizzy. Asked pt if she needed to sit down and she declined. Continued amb and then stopped and seemed to "freeze" and unable to move further. Family and myself assisted pt to sit on rollator and returned to room/bed.  Stairs            Wheelchair Mobility    Modified Rankin (Stroke Patients Only)       Balance Overall balance assessment: Needs assistance Sitting-balance support: No upper  extremity supported;Feet supported Sitting balance-Leahy Scale: Fair     Standing balance support: Single extremity supported;Bilateral upper extremity supported Standing  balance-Leahy Scale: Poor Standing balance comment: UE support and min assist for static standing                             Pertinent Vitals/Pain Pain Assessment: No/denies pain    Home Living Family/patient expects to be discharged to:: Private residence Living Arrangements: Children Available Help at Discharge: Family;Available 24 hours/day Type of Home: House Home Access: Stairs to enter   [employer] of Steps: 1   Home Layout: One level Home Equipment: Agricultural consultant (2 wheels)      Prior Function Prior Level of Function : Independent/Modified Independent             Mobility Comments: Has rolling walker but does not typically use       Hand Dominance   Dominant Hand: Right    Extremity/Trunk Assessment   Upper Extremity Assessment Upper Extremity Assessment: Generalized weakness    Lower Extremity Assessment Lower Extremity Assessment: Generalized weakness       Communication   Communication: Expressive difficulties  Cognition Arousal/Alertness: Awake/alert Behavior During Therapy: WFL for tasks assessed/performed Overall Cognitive Status: Difficult to assess                                 General Comments: Pt with expressive difficulties which familiy reports are currently worse than baseline        General Comments      Exercises     Assessment/Plan    PT Assessment Patient needs continued PT services  PT Problem List Decreased strength;Decreased activity tolerance;Decreased balance;Decreased mobility       PT Treatment Interventions DME instruction;Gait training;Functional mobility training;Therapeutic activities;Therapeutic exercise;Balance training;Patient/family education    PT Goals (Current goals can be found in the Care Plan section)  Acute Rehab PT Goals Patient Stated Goal: return home PT Goal Formulation: With patient/family Time For Goal Achievement: 11/19/21 Potential to Achieve  Goals: Good    Frequency Min 3X/week   Barriers to discharge        Co-evaluation               AM-PAC PT "6 Clicks" Mobility  Outcome Measure Help needed turning from your back to your side while in a flat bed without using bedrails?: A Little Help needed moving from lying on your back to sitting on the side of a flat bed without using bedrails?: A Little Help needed moving to and from a bed to a chair (including a wheelchair)?: A Lot Help needed standing up from a chair using your arms (e.g., wheelchair or bedside chair)?: A Lot Help needed to walk in hospital room?: A Little Help needed climbing 3-5 steps with a railing? : Total 6 Click Score: 14    End of Session Equipment Utilized During Treatment: Gait belt Activity Tolerance: Patient limited by fatigue Patient left: in bed;with call bell/phone within reach;with family/visitor present Nurse Communication: Mobility status;Other (comment) (episode of decr awareness) PT Visit Diagnosis: Other abnormalities of gait and mobility (R26.89);Unsteadiness on feet (R26.81);Difficulty in walking, not elsewhere classified (R26.2)    Time: 1610-9604 PT Time Calculation (min) (ACUTE ONLY): 14 min   Charges:   PT Evaluation $PT Eval Moderate Complexity: 1 Mod          Seon Gaertner  Tamieka Rancourt PT Acute Rehabilitation Services Pager [phone] Office [phone]   Angelina Ok [facility] 11/05/2021, 1:16 PM

## 2021-11-05 NOTE — Plan of Care (Signed)

## 2021-11-05 NOTE — Plan of Care (Signed)
Neurology plan of care note:       NO CHARGE  EEG read: This study showed evidence of epileptogenicity as well as cortical dysfunction in left frontotemporal region likely secondary to underlying stroke.  No seizures were seen throughout the recording.  TSH normal.  Vitamin B12 167-prescribed supplementation.  Ammonia normal.   [name], NP/neurology

## 2021-11-05 NOTE — Procedures (Signed)
Patient Name: [name]  MRN: [id number]  Epilepsy Attending: Charlsie Quest  Referring Physician/Provider: Dr Milon Dikes Date: 11/05/2021 Duration: 23.56 mins  Patient history: 75 year old female with history of seizures on Keppra presented with altered mental status.  EEG evaluate for seizure.   Level of alertness: Awake,  asleep  AEDs during EEG study: LCM  Technical aspects: This EEG study was done with scalp electrodes positioned according to the 10-20 International system of electrode placement. Electrical activity was acquired at a sampling rate of 500Hz  and reviewed with a high frequency filter of 70Hz  and a low frequency filter of 1Hz . EEG data were recorded continuously and digitally stored.   Description: The posterior dominant rhythm consists of 8-9 Hz activity of moderate voltage (25-35 uV) seen predominantly in posterior head regions, symmetric and reactive to eye opening and eye closing. Sleep was characterized by vertex waves, sleep spindles (12 to 14 Hz), maximal frontocentral region. EEG showed sharp waves and intermittent 2 to 3 Hz delta slowing in left fronto- temporal region. Hyperventilation and photic stimulation were not performed.     ABNORMALITY -Sharp wave, left fronto- temporal region -Intermittent slow, left fronto- temporal region   IMPRESSION: This study showed evidence of epileptogenicity as well as cortical dysfunction in left frontotemporal region likely secondary to underlying stroke.  No seizures were seen throughout the recording.   Dortha Neighbors 

## 2021-11-05 NOTE — Progress Notes (Signed)
Patient ID: [name], female   DOB: Jun 02, 1946, 75 y.o.   MRN: [id number]  PROGRESS NOTE    [name]  WUJ:[id number] DOB: 09-Oct-1946 DOA: 11/04/2021 PCP: Hoy Register, MD   Brief Narrative:  75 y.o. female with medical history significant for multiple CVA with residual expressive aphasia, dementia, Type 2 DM, seizure presented with worsening confusion and speech difficulties.  On presentation, CT of the head and CTA head and neck were negative other than chronic left M1 occlusion.  Neurology was consulted.  Assessment & Plan:   Expressive aphasia in a patient with history of multiple CVA with residual expressive aphasia History of seizures -Patient presented with worsening confusion and speech difficulties.  CT of the head and CTA head and neck were negative other than chronic left M1 occlusion. -Neurology following.  MRI of brain without contrast was negative for acute intracranial process. -EEG pending. -Continue aspirin, statin, Plavix.  PT/OT/SLP recommendations. -Continue lacosamide  AKI -Creatinine 1.5 on presentation; prior creatinine was 0.93.  Treated with IV fluids.  Creatinine pending for today.  Repeat a.m. labs  Hypertension -Blood pressure intermittently on the higher side.  Monitor.  Atenolol and lisinopril on hold for now.  Diabetes mellitus type 2 -A1c 7.3.  Continue CBGs with SSI.  Obesity -Outpatient follow-up    DVT prophylaxis: Lovenox Code Status: Full Family Communication: None at bedside Disposition Plan: Status is: Observation  The patient will require care spanning > 2 midnights and should be moved to inpatient because: EEG pending.  Follow further neurology recommendations   Consultants: Neurology  Procedures: None  Antimicrobials: None   Subjective: Patient seen and examined at bedside.  Sleepy, wakes up only very slightly.  No overnight fever, seizures or vomiting reported.  Objective: Vitals:   11/05/21 0800  11/05/21 0815 11/05/21 0900 11/05/21 1015  BP: (!) 144/67 (!) 133/52 138/65 (!) 150/57  Pulse: 66 84 63 65  Resp: Temp: 98.6 F (37 C)     TempSrc: Axillary     SpO2: 99% 97% 99% 98%  Weight:      Height:        Intake/Output Summary (Last 24 hours) at 11/05/2021 1017 Last data filed at 11/05/2021 0953 Gross per 24 hour  Intake 500 ml  Output 200 ml  Net 300 ml   Filed Weights   11/04/21 2038  Weight: 83.5 kg    Examination:  General exam: Appears calm and comfortable.  Looks chronically ill and deconditioned.  Currently on room air. Respiratory system: Bilateral decreased breath sounds at bases Cardiovascular system: S1 & S2 heard, Rate controlled Gastrointestinal system: Abdomen is nondistended, soft and nontender. Normal bowel sounds heard. Extremities: No cyanosis, clubbing; trace lower extremity edema Central nervous system: Sleepy, wakes up only very slightly, extremely slow to respond.  Poor historian.  No focal neurological deficits. Moving extremities Skin: No rashes, lesions or ulcers Psychiatry: Could not be assessed because of mental status.   Data Reviewed: I have personally reviewed following labs and imaging studies  CBC: Recent Labs  Lab 11/04/21 2055 11/04/21 2101 11/04/21 2102  WBC 8.6  --   --   NEUTROABS 4.8  --   --   HGB 13.5 14.3 13.9  HCT 42.8 42.0 41.0  MCV 86.5  --   --   PLT 319  --   --    Basic Metabolic Panel: Recent Labs  Lab 11/04/21 2055 11/04/21 2101 11/04/21 2102  NA 140 142 143  K 4.0 3.9 3.9  CL 104 102  --   CO2 27  --   --   GLUCOSE 155* 153*  --   BUN 16 20  --   CREATININE 1.25* 1.10*  --   CALCIUM 9.2  --   --    GFR: Estimated Creatinine Clearance: 46.6 mL/min (A) (by C-G formula based on SCr of 1.1 mg/dL (H)). Liver Function Tests: Recent Labs  Lab 11/04/21 2055  AST 18  ALT 13  ALKPHOS 66  BILITOT 0.3  PROT 6.5  ALBUMIN 3.7   No results for input(s): LIPASE, AMYLASE in the last 168  hours. No results for input(s): AMMONIA in the last 168 hours. Coagulation Profile: Recent Labs  Lab 11/04/21 2055  INR 1.0   Cardiac Enzymes: No results for input(s): CKTOTAL, CKMB, CKMBINDEX, TROPONINI in the last 168 hours. BNP (last 3 results) No results for input(s): PROBNP in the last 8760 hours. HbA1C: Recent Labs    11/05/21 0253  HGBA1C 7.3*   CBG: Recent Labs  Lab 11/05/21 0748  GLUCAP 132*   Lipid Profile: Recent Labs    11/05/21 0253  CHOL 110  HDL 39*  LDLCALC 50  TRIG 161  CHOLHDL 2.8   Thyroid Function Tests: No results for input(s): TSH, T4TOTAL, FREET4, T3FREE, THYROIDAB in the last 72 hours. Anemia Panel: No results for input(s): VITAMINB12, FOLATE, FERRITIN, TIBC, IRON, RETICCTPCT in the last 72 hours. Sepsis Labs: No results for input(s): PROCALCITON, LATICACIDVEN in the last 168 hours.  Recent Results (from the past 240 hour(s))  Resp Panel by RT-PCR (Flu A&B, Covid) Nasopharyngeal Swab     Status: None   Collection Time: 11/04/21  8:55 PM   Specimen: Nasopharyngeal Swab; Nasopharyngeal(NP) swabs in vial transport medium  Result Value Ref Range Status   SARS Coronavirus 2 by RT PCR NEGATIVE NEGATIVE Final    Comment: (NOTE) SARS-CoV-2 target nucleic acids are NOT DETECTED.  The SARS-CoV-2 RNA is generally detectable in upper respiratory specimens during the acute phase of infection. The lowest concentration of SARS-CoV-2 viral copies this assay can detect is 138 copies/mL. A negative result does not preclude SARS-Cov-2 infection and should not be used as the sole basis for treatment or other patient management decisions. A negative result may occur with  improper specimen collection/handling, submission of specimen other than nasopharyngeal swab, presence of viral mutation(s) within the areas targeted by this assay, and inadequate number of viral copies(<138 copies/mL). A negative result must be combined with clinical observations,  patient history, and epidemiological information. The expected result is Negative.  Fact Sheet for Patients:  BloggerCourse.com  Fact Sheet for Healthcare Providers:  SeriousBroker.it  This test is no t yet approved or cleared by the [place] FDA and  has been authorized for detection and/or diagnosis of SARS-CoV-2 by FDA under an Emergency Use Authorization (EUA). This EUA will remain  in effect (meaning this test can be used) for the duration of the COVID-19 declaration under Section 564(b)(1) of the Act, 21 U.S.C.section 360bbb-3(b)(1), unless the authorization is terminated  or revoked sooner.       Influenza A by PCR NEGATIVE NEGATIVE Final   Influenza B by PCR NEGATIVE NEGATIVE Final    Comment: (NOTE) The Xpert Xpress SARS-CoV-2/FLU/RSV plus assay is intended as an aid in the diagnosis of influenza from Nasopharyngeal swab specimens and should not be used as a sole basis for treatment. Nasal washings and aspirates are unacceptable for Xpert Xpress SARS-CoV-2/FLU/RSV testing.  Fact  Sheet for Patients: BloggerCourse.com  Fact Sheet for Healthcare Providers: SeriousBroker.it  This test is not yet approved or cleared by the [place] FDA and has been authorized for detection and/or diagnosis of SARS-CoV-2 by FDA under an Emergency Use Authorization (EUA). This EUA will remain in effect (meaning this test can be used) for the duration of the COVID-19 declaration under Section 564(b)(1) of the Act, 21 U.S.C. section 360bbb-3(b)(1), unless the authorization is terminated or revoked.  Performed at [facility], [address], [place], [place] [address]          Radiology Studies: CT ANGIO HEAD NECK W WO CM  Result Date: 11/04/2021 CLINICAL DATA:  Stroke/TIA, determinate embolic source EXAM: CT ANGIOGRAPHY HEAD AND NECK TECHNIQUE: Multidetector CT  imaging of the head and neck was performed using the standard protocol during bolus administration of intravenous contrast. Multiplanar CT image reconstructions and MIPs were obtained to evaluate the vascular anatomy. Carotid stenosis measurements (when applicable) are obtained utilizing NASCET criteria, using the distal internal carotid diameter as the denominator. CONTRAST:  88mL OMNIPAQUE IOHEXOL 350 MG/ML SOLN COMPARISON:  03/12/2021 CTA. FINDINGS: CT HEAD FINDINGS For noncontrast findings, please see same day CT head. CTA NECK FINDINGS Aortic arch: Standard branching. Imaged portion shows no evidence of aneurysm or dissection. No significant stenosis of the major arch vessel origins. Plaque at the origins of the arch vessels is not hemodynamically significant, worst at the left subclavian origin. Right carotid system: No evidence of dissection, stenosis (50% or greater) or occlusion. Noncalcified plaque at the origin of the left ICA is not hemodynamically significant. Retropharyngeal course of the right CCA. Left carotid system: No evidence of dissection, stenosis (50% or greater) or occlusion. The left common and internal carotid artery are relatively diminutive. Vertebral arteries: Codominant. No evidence of dissection, stenosis (50% or greater) or occlusion. Skeleton: Degenerative changes in the cervical spine. No acute osseous abnormality. Other neck: Negative. Upper chest: No focal pulmonary opacity or pleural effusion. Review of the MIP images confirms the above findings CTA HEAD FINDINGS Anterior circulation: Both internal carotid arteries are patent to the termini, with calcified plaque causing mild stenosis. A1 segments patent. Normal anterior communicating artery. Anterior cerebral arteries are patent to their distal aspects. Redemonstrated occlusion of the left MCA, which appears chronic, with redemonstrated collaterals. Minimal flow in the distal left MCA branches. The right M1 is patent with mild  narrowing distally. More diminutive M2 and distal MCA branches, without focal occlusion or stenosis. Posterior circulation: Vertebral arteries patent to the vertebrobasilar junction without stenosis. Posterior inferior cerebral arteries patent bilaterally. Basilar patent to its distal aspect. Superior cerebellar arteries patent bilaterally. PCAs perfused to their distal aspects without stenosis. The bilateral posterior communicating arteries are not visualized. Venous sinuses: As permitted by contrast timing, patent. Anatomic variants: None significant Review of the MIP images confirms the above findings IMPRESSION: 1. 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 2. Patent right M1, with distal narrowing and diminutive right M2 and distal MCA branches, without focal occlusion or stenosis, unchanged. 3. No hemodynamically significant stenosis in the neck. Electronically Signed   By: Wiliam Ke M.D.   On: 11/04/2021 22:07   MR BRAIN WO CONTRAST  Result Date: 11/05/2021 CLINICAL DATA:  Transient ischemic attack EXAM: MRI HEAD WITHOUT CONTRAST TECHNIQUE: Multiplanar, multiecho pulse sequences of the brain and surrounding structures were obtained without intravenous contrast. COMPARISON:  03/12/2021 FINDINGS: Brain: No restricted diffusion to  suggest acute or subacute infarct. No acute hemorrhage, mass, mass effect, or midline shift. Redemonstrated large left frontal lobe infarct with associated encephalomalacia. Additional redemonstrated infarcts in the right occipital lobe, left parietal lobe, posterior left temporal lobe, right cerebellum, and left greater than right basal ganglia. Global cerebral atrophy, advanced for age. Confluent T2 hyperintense signal in the periventricular white matter, likely the sequela of severe chronic small vessel ischemic disease. Unchanged superficial siderosis along the cerebral convexities. Vascular: Absent left M1 flow  void, consistent with known left M1 occlusion, better seen on same day CTA. Skull and upper cervical spine: Normal marrow signal. Sinuses/Orbits: Negative.  Status post bilateral lens replacements. Other: The mastoids are well aerated. IMPRESSION: 1. No acute intracranial process. 2. Redemonstrated sequela of multiple cortical and deep gray infarcts bilaterally and advanced small vessel ischemic disease. Electronically Signed   By: Wiliam Ke M.D.   On: 11/05/2021 02:16   DG Chest Port 1 View  Result Date: 11/04/2021 CLINICAL DATA:  Shortness of breath. EXAM: PORTABLE CHEST 1 VIEW COMPARISON:  March 12, 2021. FINDINGS: The heart size and mediastinal contours are within normal limits. Both lungs are clear. The visualized skeletal structures are unremarkable. IMPRESSION: No active disease. Electronically Signed   By: Lupita Raider M.D.   On: 11/04/2021 21:51   CT HEAD CODE STROKE WO CONTRAST  Result Date: 11/04/2021 CLINICAL DATA:  Code stroke. EXAM: CT HEAD WITHOUT CONTRAST TECHNIQUE: Contiguous axial images were obtained from the base of the skull through the vertex without intravenous contrast. COMPARISON:  03/12/2021. FINDINGS: Brain: No acute infarct, hemorrhage, mass, mass effect, or midline shift. Redemonstrated hypodensity in the left frontal lobe and basal ganglia. Redemonstrated chronic infarcts in the right cerebellum, right occipital lobe, and left insula. Periventricular white matter changes, likely the sequela of chronic small vessel ischemic disease. No new area of hypodensity. No extra-axial collection or hydrocephalus. Ex vacuo dilatation of the ventricles. Vascular: No hyperdense vessel. Skull: Normal. Negative for fracture or focal lesion. Sinuses/Orbits: Negative status post bilateral lens replacements. Other: The mastoids are well aerated. ASPECTS [facility] Stroke Program Early CT Score) - Ganglionic level infarction (caudate, lentiform nuclei, internal capsule, insula, M1-M3  cortex): 7 - Supraganglionic infarction (M4-M6 cortex): 3 Total score (0-10 with 10 being normal): 10 IMPRESSION: 1. No acute intracranial process.  Redemonstrated chronic infarcts. 2. ASPECTS is 10 Code stroke imaging results were communicated on 11/04/2021 at 9:18 pm to provider [place] via secure text paging. Electronically Signed   By: Wiliam Ke M.D.   On: 11/04/2021 21:18        Scheduled Meds:  aspirin  81 mg Oral q AM   [START ON 11/06/2021] atorvastatin  20 mg Oral Daily   [START ON 11/06/2021] clopidogrel  75 mg Oral Daily   enoxaparin (LOVENOX) injection  40 mg Subcutaneous Q24H   insulin aspart  0-9 Units Subcutaneous TID WC   lacosamide  100 mg Oral BID   Continuous Infusions:        Glade Lloyd, MD [facility] 11/05/2021, 10:17 AM

## 2021-11-05 NOTE — Progress Notes (Signed)
Patient has arrived on 3W, Rm 8, unable to place on telemetry due to lack of equipment, will place as soon as equipment made available.

## 2021-11-05 NOTE — ED Notes (Signed)
Orthostatic bp:   Lying: HR 77, BP: 143/81 Sitting: HR 70, BP: 129/90 Standing: HR 82, BP: 150/124

## 2021-11-05 NOTE — TOC Initial Note (Signed)
Transition of Care [facility]) - Initial/Assessment Note    Patient Details  Name: [name] MRN: [id number] Date of Birth: Feb 18, 1946  [facility] CM/SW Contact:    Lockie Pares, RN Phone Number: 11/05/2021, 8:04 AM  Clinical Narrative:                 Patient was at home doing normal things when she suddenly had a change in vision and then became aphasic. History of prior stroke. EMS was immediately called by daughter she lives with. To ED code stroke CT done resolving of symptoms. Admitted for further workup.  Was taking Keppra, changed medications, will obtain a MRI of brain currently under OBS status.  May need HH, CM will follow for needs, recommendations, and transtions  Expected Discharge Plan: Home w Home Health Services Barriers to Discharge: Continued Medical Work up   Patient Goals and CMS Choice        Expected Discharge Plan and Services Expected Discharge Plan: Home w Home Health Services   Discharge Planning Services: CM Consult   Living arrangements for the past 2 months: Single Family Home                                      Prior Living Arrangements/Services Living arrangements for the past 2 months: Single Family Home Lives with:: Adult Children Patient language and need for interpreter reviewed:: Yes        Need for Family Participation in Patient Care: Yes (Comment) Care giver support system in place?: Yes (comment)   Criminal Activity/Legal Involvement Pertinent to Current Situation/Hospitalization: No - Comment as needed  Activities of Daily Living      Permission Sought/Granted                  Emotional Assessment       Orientation: : Fluctuating Orientation (Suspected and/or reported Sundowners) Alcohol / Substance Use: Tobacco Use Psych Involvement: No (comment)  Admission diagnosis:  AMS (altered mental status) [R41.82] Patient Active Problem List   Diagnosis Date Noted   Seizure (HCC)  11/05/2021   AKI (acute kidney injury) (HCC) 11/05/2021   Osteoarthritis of hip 06/18/2021   AMS (altered mental status) 03/12/2021   DM type 2 (diabetes mellitus, type 2) (HCC) 07/09/2014   Facial weakness 07/08/2014   Left leg weakness 07/08/2014   H/O: CVA (cerebrovascular accident) 07/08/2014   H/O subdural hemorrhage 07/08/2014   Stroke (HCC)    Hypertension    PCP:  Hoy Register, MD Pharmacy:   [facility] [address], [place] - [address] [place] [address] Phone: [phone] Fax: [phone]  [facility] [address] [place] [address] Phone: [phone] Fax: [phone]     Social Determinants of Health (SDOH) Interventions    Readmission Risk Interventions No flowsheet data found.

## 2021-11-05 NOTE — Progress Notes (Signed)
Pt placed on cardiac telemetry.

## 2021-11-05 NOTE — ED Notes (Signed)
Accepting RN has not read report in secure chat yet. Called 3W to follow up. Secretary states RN is at lunch at this time and will review chart and secure chat when she returns in about 15-20 minutes. No other RN available to review and accept pt at this time.

## 2021-11-05 NOTE — ED Notes (Signed)
Pt used [facility] with assistance.

## 2021-11-06 ENCOUNTER — Other Ambulatory Visit: Payer: Self-pay

## 2021-11-06 DIAGNOSIS — D519 Vitamin B12 deficiency anemia, unspecified: Secondary | ICD-10-CM

## 2021-11-06 LAB — CBC WITH DIFFERENTIAL/PLATELET
Abs Immature Granulocytes: 0.02 10*3/uL (ref 0.00–0.07)
Basophils Absolute: 0.1 10*3/uL (ref 0.0–0.1)
Basophils Relative: 1 %
Eosinophils Absolute: 0.1 10*3/uL (ref 0.0–0.5)
Eosinophils Relative: 2 %
HCT: 39.4 % (ref 36.0–46.0)
Hemoglobin: 12.6 g/dL (ref 12.0–15.0)
Immature Granulocytes: 0 %
Lymphocytes Relative: 39 %
Lymphs Abs: 3.2 10*3/uL (ref 0.7–4.0)
MCH: 27.2 pg (ref 26.0–34.0)
MCHC: 32 g/dL (ref 30.0–36.0)
MCV: 84.9 fL (ref 80.0–100.0)
Monocytes Absolute: 0.5 10*3/uL (ref 0.1–1.0)
Monocytes Relative: 6 %
Neutro Abs: 4.2 10*3/uL (ref 1.7–7.7)
Neutrophils Relative %: 52 %
Platelets: 286 10*3/uL (ref 150–400)
RBC: 4.64 MIL/uL (ref 3.87–5.11)
RDW: 14.2 % (ref 11.5–15.5)
WBC: 8.1 10*3/uL (ref 4.0–10.5)
nRBC: 0 % (ref 0.0–0.2)

## 2021-11-06 LAB — COMPREHENSIVE METABOLIC PANEL
ALT: 12 U/L (ref 0–44)
AST: 14 U/L — ABNORMAL LOW (ref 15–41)
Albumin: 3.1 g/dL — ABNORMAL LOW (ref 3.5–5.0)
Alkaline Phosphatase: 50 U/L (ref 38–126)
Anion gap: 7 (ref 5–15)
BUN: 14 mg/dL (ref 8–23)
CO2: 26 mmol/L (ref 22–32)
Calcium: 8.6 mg/dL — ABNORMAL LOW (ref 8.9–10.3)
Chloride: 107 mmol/L (ref 98–111)
Creatinine, Ser: 1.06 mg/dL — ABNORMAL HIGH (ref 0.44–1.00)
GFR, Estimated: 55 mL/min — ABNORMAL LOW (ref >60)
Glucose, Bld: 119 mg/dL — ABNORMAL HIGH (ref 70–99)
Potassium: 3.9 mmol/L (ref 3.5–5.1)
Sodium: 140 mmol/L (ref 135–145)
Total Bilirubin: 0.5 mg/dL (ref 0.3–1.2)
Total Protein: 5.6 g/dL — ABNORMAL LOW (ref 6.5–8.1)

## 2021-11-06 LAB — GLUCOSE, CAPILLARY: Glucose-Capillary: 136 mg/dL — ABNORMAL HIGH (ref 70–99)

## 2021-11-06 LAB — MAGNESIUM: Magnesium: 1.7 mg/dL (ref 1.7–2.4)

## 2021-11-06 MED ORDER — VITAMIN B-12 1000 MCG PO TABS
1000.0000 ug | ORAL_TABLET | Freq: Every day | ORAL | 0 refills | Status: AC
  Filled 2021-11-06: qty 30, 30d supply, fill #0

## 2021-11-06 MED ORDER — FOLIC ACID 1 MG PO TABS
1.0000 mg | ORAL_TABLET | Freq: Every day | ORAL | 0 refills | Status: AC
  Filled 2021-11-06: qty 30, 30d supply, fill #0

## 2021-11-06 MED ORDER — DICLOFENAC SODIUM 1 % EX GEL
4.0000 g | Freq: Four times a day (QID) | CUTANEOUS | Status: DC | PRN
Start: 2021-11-06 — End: 2023-02-10

## 2021-11-06 NOTE — Discharge Summary (Addendum)
Physician Discharge Summary  [name] GEX:[id number] DOB: June 30, 1946 DOA: 11/04/2021  PCP: Hoy Register, MD  Admit date: 11/04/2021 Discharge date: 11/06/2021  Admitted From: Home Disposition: Home  Recommendations for Outpatient Follow-up:  Follow up with PCP in 1 week  Outpatient follow-up with neurology Follow up in ED if symptoms worsen or new appear   Home Health: Home with PT/SLP Equipment/Devices: None  Discharge Condition: Stable CODE STATUS: Full Diet recommendation: Heart healthy/carb modified diet  Brief/Interim Summary: 75 y.o. female with medical history significant for multiple CVA with residual expressive aphasia, dementia, Type 2 DM, seizure presented with worsening confusion and speech difficulties.  On presentation, CT of the head and CTA head and neck were negative other than chronic left M1 occlusion.  Neurology was consulted.  MRI of brain was without any acute intracranial process.  EEG was negative for any seizure activities.  Vitamin B12 was 167; supplementation was started.  Mental status has improved.  Neurology has cleared the patient for discharge.  She will be discharged home today with outpatient follow-up with PCP and neurology.  Discharge Diagnoses:   Expressive aphasia in a patient with history of multiple CVA with residual expressive aphasia History of seizures -Patient presented with worsening confusion and speech difficulties.  CT of the head and CTA head and neck were negative other than chronic left M1 occlusion. -MRI of brain without contrast was negative for acute intracranial process. -EEG negative for seizures. -Continue aspirin, statin, Plavix.   -Continue lacosamide -Neurology recommended continuing current dose of lacosamide along with vitamin B12 supplementation and outpatient follow-up with neurology. -PT/SLP recommended home health PT/SLP  B12 deficiency -Vitamin B12 167.  Treated parenterally in the hospital.   Continue oral supplementation on discharge.  We will also empirically treat with oral folic acid.   AKI -Creatinine 1.5 on presentation; prior creatinine was 0.93.  Treated with IV fluids.  Resolved; creatinine 1.06 today.  Outpatient follow-up.    Hypertension -Blood pressure intermittently on the higher side.  Resume atenolol and lisinopril.   Diabetes mellitus type 2 -A1c 7.3.  Carb modified diet.  Resume metformin.  Obesity -Outpatient follow-up  Discharge Instructions  Discharge Instructions     Ambulatory referral to Neurology   Complete by: As directed    An appointment is requested in approximately: 2-4 weeks with neuropsychologist for dementia evaluation and possible medications.   Diet - low sodium heart healthy   Complete by: As directed    Diet Carb Modified   Complete by: As directed    Increase activity slowly   Complete by: As directed       Allergies as of 11/06/2021       Reactions   Penicillins Itching   Sulfa Antibiotics Other (See Comments)   Reaction not recalled, but patient was told she was allergic        Medication List     TAKE these medications    Accu-Chek Aviva device Use as instructed daily.   accu-chek multiclix lancets Use as instructed   acetaminophen 325 MG tablet Commonly known as: TYLENOL Take 650 mg by mouth every 6 (six) hours as needed for pain.   aspirin 81 MG chewable tablet Chew 81 mg by mouth in the morning.   atenolol 25 MG tablet Commonly known as: TENORMIN Take1 tablet by mouth daily. What changed: how much to take   atorvastatin 20 MG tablet Commonly known as: LIPITOR Take 1 tablet (20 mg total) by mouth daily.   clopidogrel 75  MG tablet Commonly known as: PLAVIX Take1 tablet by mouth daily. What changed: how much to take   diclofenac Sodium 1 % Gel Commonly known as: Voltaren Apply 4 g topically 4 (four) times daily as needed (arthritis pain).   folic acid 1 MG tablet Commonly known as:  FOLVITE Take 1 tablet (1 mg total) by mouth daily.   Lacosamide 100 MG Tabs Take 1 tablet (100 mg total) by mouth in the morning and at bedtime.   lisinopril 10 MG tablet Commonly known as: ZESTRIL TAKE 1 TABLET (10 MG TOTAL) BY MOUTH DAILY. What changed: how much to take   metFORMIN 500 MG tablet Commonly known as: GLUCOPHAGE TAKE 2 TABLETS (1,000 MG TOTAL) BY MOUTH 2 (TWO) TIMES DAILY WITH A MEAL. What changed: how much to take   traMADol 50 MG tablet Commonly known as: ULTRAM Take 1 tablet (50 mg total) by mouth at bedtime as needed. For chronic bilateral osteoarthritis What changed:  reasons to take this additional instructions   True Metrix Blood Glucose Test test strip Generic drug: glucose blood Use as instructed   vitamin B-12 1000 MCG tablet Commonly known as: CYANOCOBALAMIN Take 1 tablet (1,000 mcg total) by mouth daily.        Follow-up Information     Hoy Register, MD. Schedule an appointment as soon as possible for a visit in 1 week(s).   Specialty: Family Medicine Contact information: [address] [place] [address] [phone]                Allergies  Allergen Reactions   Penicillins Itching   Sulfa Antibiotics Other (See Comments)    Reaction not recalled, but patient was told she was allergic    Consultations:  Neurology   Procedures/Studies: CT ANGIO HEAD NECK W WO CM  Result Date: 11/04/2021 CLINICAL DATA:  Stroke/TIA, determinate embolic source EXAM: CT ANGIOGRAPHY HEAD AND NECK TECHNIQUE: Multidetector CT imaging of the head and neck was performed using the standard protocol during bolus administration of intravenous contrast. Multiplanar CT image reconstructions and MIPs were obtained to evaluate the vascular anatomy. Carotid stenosis measurements (when applicable) are obtained utilizing NASCET criteria, using the distal internal carotid diameter as the denominator. CONTRAST:  70mL OMNIPAQUE IOHEXOL 350 MG/ML SOLN  COMPARISON:  03/12/2021 CTA. FINDINGS: CT HEAD FINDINGS For noncontrast findings, please see same day CT head. CTA NECK FINDINGS Aortic arch: Standard branching. Imaged portion shows no evidence of aneurysm or dissection. No significant stenosis of the major arch vessel origins. Plaque at the origins of the arch vessels is not hemodynamically significant, worst at the left subclavian origin. Right carotid system: No evidence of dissection, stenosis (50% or greater) or occlusion. Noncalcified plaque at the origin of the left ICA is not hemodynamically significant. Retropharyngeal course of the right CCA. Left carotid system: No evidence of dissection, stenosis (50% or greater) or occlusion. The left common and internal carotid artery are relatively diminutive. Vertebral arteries: Codominant. No evidence of dissection, stenosis (50% or greater) or occlusion. Skeleton: Degenerative changes in the cervical spine. No acute osseous abnormality. Other neck: Negative. Upper chest: No focal pulmonary opacity or pleural effusion. Review of the MIP images confirms the above findings CTA HEAD FINDINGS Anterior circulation: Both internal carotid arteries are patent to the termini, with calcified plaque causing mild stenosis. A1 segments patent. Normal anterior communicating artery. Anterior cerebral arteries are patent to their distal aspects. Redemonstrated occlusion of the left MCA, which appears chronic, with redemonstrated collaterals. Minimal flow  in the distal left MCA branches. The right M1 is patent with mild narrowing distally. More diminutive M2 and distal MCA branches, without focal occlusion or stenosis. Posterior circulation: Vertebral arteries patent to the vertebrobasilar junction without stenosis. Posterior inferior cerebral arteries patent bilaterally. Basilar patent to its distal aspect. Superior cerebellar arteries patent bilaterally. PCAs perfused to their distal aspects without stenosis. The bilateral  posterior communicating arteries are not visualized. Venous sinuses: As permitted by contrast timing, patent. Anatomic variants: None significant Review of the MIP images confirms the above findings IMPRESSION: 1. 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 2. Patent right M1, with distal narrowing and diminutive right M2 and distal MCA branches, without focal occlusion or stenosis, unchanged. 3. No hemodynamically significant stenosis in the neck. Electronically Signed   By: Wiliam Ke M.D.   On: 11/04/2021 22:07   MR BRAIN WO CONTRAST  Result Date: 11/05/2021 CLINICAL DATA:  Transient ischemic attack EXAM: MRI HEAD WITHOUT CONTRAST TECHNIQUE: Multiplanar, multiecho pulse sequences of the brain and surrounding structures were obtained without intravenous contrast. COMPARISON:  03/12/2021 FINDINGS: Brain: No restricted diffusion to suggest acute or subacute infarct. No acute hemorrhage, mass, mass effect, or midline shift. Redemonstrated large left frontal lobe infarct with associated encephalomalacia. Additional redemonstrated infarcts in the right occipital lobe, left parietal lobe, posterior left temporal lobe, right cerebellum, and left greater than right basal ganglia. Global cerebral atrophy, advanced for age. Confluent T2 hyperintense signal in the periventricular white matter, likely the sequela of severe chronic small vessel ischemic disease. Unchanged superficial siderosis along the cerebral convexities. Vascular: Absent left M1 flow void, consistent with known left M1 occlusion, better seen on same day CTA. Skull and upper cervical spine: Normal marrow signal. Sinuses/Orbits: Negative.  Status post bilateral lens replacements. Other: The mastoids are well aerated. IMPRESSION: 1. No acute intracranial process. 2. Redemonstrated sequela of multiple cortical and deep gray infarcts bilaterally and advanced small vessel ischemic disease.  Electronically Signed   By: Wiliam Ke M.D.   On: 11/05/2021 02:16   DG Chest Port 1 View  Result Date: 11/04/2021 CLINICAL DATA:  Shortness of breath. EXAM: PORTABLE CHEST 1 VIEW COMPARISON:  March 12, 2021. FINDINGS: The heart size and mediastinal contours are within normal limits. Both lungs are clear. The visualized skeletal structures are unremarkable. IMPRESSION: No active disease. Electronically Signed   By: Lupita Raider M.D.   On: 11/04/2021 21:51   EEG adult  Result Date: 11/05/2021 Charlsie Quest, MD     11/05/2021 12:45 PM Patient Name: ROSETTE BELLAVANCE MRN: [id number] Epilepsy Attending: Charlsie Quest Referring Physician/Provider: Dr Milon Dikes Date: 11/05/2021 Duration: 23.56 mins Patient history: 75 year old female with history of seizures on Keppra presented with altered mental status.  EEG evaluate for seizure. Level of alertness: Awake,  asleep AEDs during EEG study: LCM Technical aspects: This EEG study was done with scalp electrodes positioned according to the 10-20 International system of electrode placement. Electrical activity was acquired at a sampling rate of 500Hz  and reviewed with a high frequency filter of 70Hz  and a low frequency filter of 1Hz . EEG data were recorded continuously and digitally stored. Description: The posterior dominant rhythm consists of 8-9 Hz activity of moderate voltage (25-35 uV) seen predominantly in posterior head regions, symmetric and reactive to eye opening and eye closing. Sleep was characterized by vertex waves, sleep spindles (12 to 14 Hz), maximal frontocentral region. EEG showed sharp waves and  intermittent 2 to 3 Hz delta slowing in left fronto- temporal region. Hyperventilation and photic stimulation were not performed.   ABNORMALITY -Sharp wave, left fronto- temporal region -Intermittent slow, left fronto- temporal region  IMPRESSION: This study showed evidence of epileptogenicity as well as cortical dysfunction in left  frontotemporal region likely secondary to underlying stroke.  No seizures were seen throughout the recording.  Charlsie Quest   CT HEAD CODE STROKE WO CONTRAST  Result Date: 11/04/2021 CLINICAL DATA:  Code stroke. EXAM: CT HEAD WITHOUT CONTRAST TECHNIQUE: Contiguous axial images were obtained from the base of the skull through the vertex without intravenous contrast. COMPARISON:  03/12/2021. FINDINGS: Brain: No acute infarct, hemorrhage, mass, mass effect, or midline shift. Redemonstrated hypodensity in the left frontal lobe and basal ganglia. Redemonstrated chronic infarcts in the right cerebellum, right occipital lobe, and left insula. Periventricular white matter changes, likely the sequela of chronic small vessel ischemic disease. No new area of hypodensity. No extra-axial collection or hydrocephalus. Ex vacuo dilatation of the ventricles. Vascular: No hyperdense vessel. Skull: Normal. Negative for fracture or focal lesion. Sinuses/Orbits: Negative status post bilateral lens replacements. Other: The mastoids are well aerated. ASPECTS [facility] Stroke Program Early CT Score) - Ganglionic level infarction (caudate, lentiform nuclei, internal capsule, insula, M1-M3 cortex): 7 - Supraganglionic infarction (M4-M6 cortex): 3 Total score (0-10 with 10 being normal): 10 IMPRESSION: 1. No acute intracranial process.  Redemonstrated chronic infarcts. 2. ASPECTS is 10 Code stroke imaging results were communicated on 11/04/2021 at 9:18 pm to provider [place] via secure text paging. Electronically Signed   By: Wiliam Ke M.D.   On: 11/04/2021 21:18      Subjective: Patient seen and examined at bedside.  More awake and wants to go home today.  No overnight fever, seizures, vomiting reported.  Discharge Exam: Vitals:   11/06/21 0354 11/06/21 0809  BP: (!) 163/66 131/60  Pulse: 74 85  Resp: 16 14  Temp: 98.2 F (36.8 C) 98 F (36.7 C)  SpO2: 100% 97%    General: Pt is alert, awake, not in acute  distress.  Slow to respond.  Looks chronically ill.  Currently on room air. Cardiovascular: rate controlled, S1/S2 + Respiratory: bilateral decreased breath sounds at bases Abdominal: Soft, NT, ND, bowel sounds + Extremities: Trace lower extremity edema; no cyanosis    The results of significant diagnostics from this hospitalization (including imaging, microbiology, ancillary and laboratory) are listed below for reference.     Microbiology: Recent Results (from the past 240 hour(s))  Resp Panel by RT-PCR (Flu A&B, Covid) Nasopharyngeal Swab     Status: None   Collection Time: 11/04/21  8:55 PM   Specimen: Nasopharyngeal Swab; Nasopharyngeal(NP) swabs in vial transport medium  Result Value Ref Range Status   SARS Coronavirus 2 by RT PCR NEGATIVE NEGATIVE Final    Comment: (NOTE) SARS-CoV-2 target nucleic acids are NOT DETECTED.  The SARS-CoV-2 RNA is generally detectable in upper respiratory specimens during the acute phase of infection. The lowest concentration of SARS-CoV-2 viral copies this assay can detect is 138 copies/mL. A negative result does not preclude SARS-Cov-2 infection and should not be used as the sole basis for treatment or other patient management decisions. A negative result may occur with  improper specimen collection/handling, submission of specimen other than nasopharyngeal swab, presence of viral mutation(s) within the areas targeted by this assay, and inadequate number of viral copies(<138 copies/mL). A negative result must be combined with clinical observations, patient history, and epidemiological information.  The expected result is Negative.  Fact Sheet for Patients:  BloggerCourse.com  Fact Sheet for Healthcare Providers:  SeriousBroker.it  This test is no t yet approved or cleared by the [place] FDA and  has been authorized for detection and/or diagnosis of SARS-CoV-2 by FDA under an Emergency  Use Authorization (EUA). This EUA will remain  in effect (meaning this test can be used) for the duration of the COVID-19 declaration under Section 564(b)(1) of the Act, 21 U.S.C.section 360bbb-3(b)(1), unless the authorization is terminated  or revoked sooner.       Influenza A by PCR NEGATIVE NEGATIVE Final   Influenza B by PCR NEGATIVE NEGATIVE Final    Comment: (NOTE) The Xpert Xpress SARS-CoV-2/FLU/RSV plus assay is intended as an aid in the diagnosis of influenza from Nasopharyngeal swab specimens and should not be used as a sole basis for treatment. Nasal washings and aspirates are unacceptable for Xpert Xpress SARS-CoV-2/FLU/RSV testing.  Fact Sheet for Patients: BloggerCourse.com  Fact Sheet for Healthcare Providers: SeriousBroker.it  This test is not yet approved or cleared by the [place] FDA and has been authorized for detection and/or diagnosis of SARS-CoV-2 by FDA under an Emergency Use Authorization (EUA). This EUA will remain in effect (meaning this test can be used) for the duration of the COVID-19 declaration under Section 564(b)(1) of the Act, 21 U.S.C. section 360bbb-3(b)(1), unless the authorization is terminated or revoked.  Performed at [facility], [address], [place], [place] [address]      Labs: BNP (last 3 results) No results for input(s): BNP in the last 8760 hours. Basic Metabolic Panel: Recent Labs  Lab 11/04/21 2055 11/04/21 2101 11/04/21 2102 11/06/21 0222  NA 140 142 143 140  K 4.0 3.9 3.9 3.9  CL 104 102  --  107  CO2 27  --   --  26  GLUCOSE 155* 153*  --  119*  BUN 16 20  --  14  CREATININE 1.25* 1.10*  --  1.06*  CALCIUM 9.2  --   --  8.6*  MG  --   --   --  1.7   Liver Function Tests: Recent Labs  Lab 11/04/21 2055 11/06/21 0222  AST 18 14*  ALT 13 12  ALKPHOS 66 50  BILITOT 0.3 0.5  PROT 6.5 5.6*  ALBUMIN 3.7 3.1*   No results for input(s):  LIPASE, AMYLASE in the last 168 hours. Recent Labs  Lab 11/05/21 1007  AMMONIA 23   CBC: Recent Labs  Lab 11/04/21 2055 11/04/21 2101 11/04/21 2102 11/06/21 0222  WBC 8.6  --   --  8.1  NEUTROABS 4.8  --   --  4.2  HGB 13.5 14.3 13.9 12.6  HCT 42.8 42.0 41.0 39.4  MCV 86.5  --   --  84.9  PLT 319  --   --  286   Cardiac Enzymes: No results for input(s): CKTOTAL, CKMB, CKMBINDEX, TROPONINI in the last 168 hours. BNP: Invalid input(s): POCBNP CBG: Recent Labs  Lab 11/05/21 0748 11/05/21 1141 11/05/21 1615 11/05/21 2109 11/06/21 0607  GLUCAP 132* 133* 99 100* 136*   D-Dimer No results for input(s): DDIMER in the last 72 hours. Hgb A1c Recent Labs    11/05/21 0253  HGBA1C 7.3*   Lipid Profile Recent Labs    11/05/21 0253  CHOL 110  HDL 39*  LDLCALC 50  TRIG 604  CHOLHDL 2.8   Thyroid function studies Recent Labs    11/05/21  1324  TSH 1.497   Anemia work up Recent Labs    11/05/21 1324  VITAMINB12 167*   Urinalysis    Component Value Date/Time   COLORURINE YELLOW 11/05/2021 0531   APPEARANCEUR CLEAR 11/05/2021 0531   LABSPEC 1.010 11/05/2021 0531   PHURINE 5.5 11/05/2021 0531   GLUCOSEU NEGATIVE 11/05/2021 0531   HGBUR NEGATIVE 11/05/2021 0531   BILIRUBINUR NEGATIVE 11/05/2021 0531   KETONESUR NEGATIVE 11/05/2021 0531   PROTEINUR NEGATIVE 11/05/2021 0531   UROBILINOGEN 1.0 09/13/2015 1455   NITRITE NEGATIVE 11/05/2021 0531   LEUKOCYTESUR NEGATIVE 11/05/2021 0531   Sepsis Labs Invalid input(s): PROCALCITONIN,  WBC,  LACTICIDVEN Microbiology Recent Results (from the past 240 hour(s))  Resp Panel by RT-PCR (Flu A&B, Covid) Nasopharyngeal Swab     Status: None   Collection Time: 11/04/21  8:55 PM   Specimen: Nasopharyngeal Swab; Nasopharyngeal(NP) swabs in vial transport medium  Result Value Ref Range Status   SARS Coronavirus 2 by RT PCR NEGATIVE NEGATIVE Final    Comment: (NOTE) SARS-CoV-2 target nucleic acids are NOT DETECTED.  The  SARS-CoV-2 RNA is generally detectable in upper respiratory specimens during the acute phase of infection. The lowest concentration of SARS-CoV-2 viral copies this assay can detect is 138 copies/mL. A negative result does not preclude SARS-Cov-2 infection and should not be used as the sole basis for treatment or other patient management decisions. A negative result may occur with  improper specimen collection/handling, submission of specimen other than nasopharyngeal swab, presence of viral mutation(s) within the areas targeted by this assay, and inadequate number of viral copies(<138 copies/mL). A negative result must be combined with clinical observations, patient history, and epidemiological information. The expected result is Negative.  Fact Sheet for Patients:  BloggerCourse.com  Fact Sheet for Healthcare Providers:  SeriousBroker.it  This test is no t yet approved or cleared by the [place] FDA and  has been authorized for detection and/or diagnosis of SARS-CoV-2 by FDA under an Emergency Use Authorization (EUA). This EUA will remain  in effect (meaning this test can be used) for the duration of the COVID-19 declaration under Section 564(b)(1) of the Act, 21 U.S.C.section 360bbb-3(b)(1), unless the authorization is terminated  or revoked sooner.       Influenza A by PCR NEGATIVE NEGATIVE Final   Influenza B by PCR NEGATIVE NEGATIVE Final    Comment: (NOTE) The Xpert Xpress SARS-CoV-2/FLU/RSV plus assay is intended as an aid in the diagnosis of influenza from Nasopharyngeal swab specimens and should not be used as a sole basis for treatment. Nasal washings and aspirates are unacceptable for Xpert Xpress SARS-CoV-2/FLU/RSV testing.  Fact Sheet for Patients: BloggerCourse.com  Fact Sheet for Healthcare Providers: SeriousBroker.it  This test is not yet approved or  cleared by the [place] FDA and has been authorized for detection and/or diagnosis of SARS-CoV-2 by FDA under an Emergency Use Authorization (EUA). This EUA will remain in effect (meaning this test can be used) for the duration of the COVID-19 declaration under Section 564(b)(1) of the Act, 21 U.S.C. section 360bbb-3(b)(1), unless the authorization is terminated or revoked.  Performed at [facility], [address] [place], [place] [address]      Time coordinating discharge: 35 minutes  SIGNED:   Glade Lloyd, MD  [facility] 11/06/2021, 9:49 AM

## 2021-11-06 NOTE — TOC Transition Note (Signed)
Transition of Care [facility]) - CM/SW Discharge Note   Patient Details  Name: [name] MRN: [id number] Date of Birth: 07/27/1946  [facility] CM/SW Contact:  Pollie Friar, RN Phone Number: 11/06/2021, 11:45 AM   Clinical Narrative:    CM met with the patient and then called her daughter with patient permission. They are agreeable to home health services and this was arranged with [facility]. Information on the AVS.  Pt has all needed DME at home.  Daughter oversees the patients medications at home and provides needed transport.     Final next level of care: Home w Home Health Services Barriers to Discharge: No Barriers Identified   Patient Goals and CMS Choice   CMS Medicare.gov Compare Post Acute Care list provided to:: Patient Represenative (must comment) Choice offered to / list presented to : Adult Children  Discharge Placement                       Discharge Plan and Services   Discharge Planning Services: CM Consult                      HH Arranged: PT, Speech Therapy HH Agency: [place] Date [facility] Agency Contacted: 11/06/21   Representative spoke with at [place]: [place]) Interventions     Readmission Risk Interventions No flowsheet data found.

## 2021-11-06 NOTE — Progress Notes (Addendum)
Neurology Progress Note  S: States her mental status is back to normal. When asked about dementia, she states that has been mentioned to her in the past. NP asked her about the [facility] found in her urine, and she denied any drug use. She tells a story about closing her door at home to keep people out.   O: Current vital signs: BP 131/60 (BP Location: Left Arm)    Pulse 85    Temp 98 F (36.7 C) (Oral)    Resp 14    Ht 5\' 3"  (1.6 m)    Wt 82.1 kg    LMP 09/28/2014 (Approximate)    SpO2 97%    BMI 32.06 kg/m  Vital signs in last 24 hours: Temp:  [98 F (36.7 C)-98.6 F (37 C)] 98 F (36.7 C) (12/16 0809) Pulse Rate:  [63-85] 85 (12/16 0809) Resp:  [14-20] 14 (12/16 0809) BP: (125-166)/(43-82) 131/60 (12/16 0809) SpO2:  [96 %-100 %] 97 % (12/16 0809) Weight:  [82.1 kg] 82.1 kg (12/16 0500)  GENERAL: Well appearing  female. Awake, alert in NAD. HEENT: Normocephalic and atraumatic. LUNGS: Normal respiratory effort.  Ext: warm.  NEURO:  Mental Status: Alert. Oriented to name, place, month, holiday, year, day of week, but not date.   Speech/Language: speech is without aphasia or dysarthria.  Naming, repetition, fluency, and comprehension intact. Circumferential speech.   Cranial Nerves:  PERRL. EOMI. Eyelids elevate symmetrically.  Sensation is intact to light touch and symmetrical to face and all 4 extremities. Smile is symmetrical. Able to puff cheeks and raise eyebrows. Shoulder shrug 5/5. Motor: moves all 4 extremities spontaneously, but can not lift LLE as high as RLE because of Left hip pain. Tone: is normal and bulk is normal.  Medications  Current Facility-Administered Medications:    0.9 %  sodium chloride infusion, , Intravenous, Continuous, Alekh, Kshitiz, MD, Last Rate: 75 mL/hr at 11/06/21 0606, Infusion Verify at 11/06/21 0606   acetaminophen (TYLENOL) tablet 650 mg, 650 mg, Oral, Q4H PRN **OR** acetaminophen (TYLENOL) 160 MG/5ML solution 650 mg, 650 mg, Per Tube, Q4H PRN  **OR** acetaminophen (TYLENOL) suppository 650 mg, 650 mg, Rectal, Q4H PRN, Tu, Ching T, DO   aspirin chewable tablet 81 mg, 81 mg, Oral, q AM, Tu, Ching T, DO, 81 mg at 11/06/21 0605   atorvastatin (LIPITOR) tablet 20 mg, 20 mg, Oral, Daily, Tu, Ching T, DO   clopidogrel (PLAVIX) tablet 75 mg, 75 mg, Oral, Daily, Tu, Ching T, DO   cyanocobalamin ((VITAMIN B-12)) injection 1,000 mcg, 1,000 mcg, Intramuscular, Daily, Kirby-Graham, 10-18-2004, NP, 1,000 mcg at 11/05/21 1850   enoxaparin (LOVENOX) injection 40 mg, 40 mg, Subcutaneous, Q24H, Tu, Ching T, DO, 40 mg at 11/05/21 11/07/21   folic acid (FOLVITE) tablet 1 mg, 1 mg, Oral, Daily, Alekh, Kshitiz, MD, 1 mg at 11/05/21 1851   insulin aspart (novoLOG) injection 0-9 Units, 0-9 Units, Subcutaneous, TID WC, Tu, Ching T, DO   lacosamide (VIMPAT) tablet 100 mg, 100 mg, Oral, BID, Tu, Ching T, DO, 100 mg at 11/05/21 2106   senna-docusate (Senokot-S) tablet 1 tablet, 1 tablet, Oral, QHS PRN, Tu, Ching T, DO   [START ON 11/11/2021] vitamin B-12 (CYANOCOBALAMIN) tablet 500 mcg, 500 mcg, Oral, Daily, Kirby-Graham, 11/13/2021, NP  Imaging MD has reviewed images in epic and the results pertinent to this consultation are:  11/05/21 MRI Brain  No acute intracranial process.Re demonstrated sequela of multiple cortical and deep gray infarcts bilaterally and advanced small vessel ischemic disease.  Assessment: 75 yo female who presented with encephalopathy and imaging was negative for acute stroke. EEG without seizure activity. Her B12 was low and is being supplemented. Her presentation is much more awake and interactive than yesterday. Now that she is more interactive, exam concerning for cognitive decline/short term memory loss.  Impression: -encephalopathy, back to baseline.  -History of seizures with ? Non adherence with Vimpat.  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  -Outpatient eval for dementia  Recommendations/Plan:  -Outpatient neurology for evaluation of dementia  with need for medications.  -Continue Vimpat 100mg  po bid.  -Continue B12 supplementation at discharge as already ordered.  -Locosamide level as outpatient.  -Neurology will be available for questions prn.   Pt seen by , MSN, APN-BC/Nurse Practitioner/Neuro and later by MD. Note and plan to be edited as needed by MD.  Pager: [name]  Attending Neurohospitalist Addendum Patient seen and examined with APP/Resident. Agree with the history and physical as documented above. Agree with the plan as documented, which I helped formulate. I have independently reviewed the chart, obtained history, review of systems and examined the patient.I have personally reviewed pertinent head/neck/spine imaging (CT/MRI). Please feel free to call with any questions.  -- [phone], MD Neurologist [facility] Pager: [phone]   ADDENDUM I was asked to specify "encephalopathy". In my opinion - she had clinically multifactorial toxic metabolic encephalopathy. Hope that clears the query.  -- [phone], MD Neurologist [facility] Pager: [phone]

## 2021-11-06 NOTE — Evaluation (Signed)
Speech Language Pathology Evaluation Patient Details Name: [name] MRN: [id number] DOB: Jun 01, 1946 Today's Date: 11/06/2021 Time: 1275-1700 SLP Time Calculation (min) (ACUTE ONLY): 18 min  Problem List:  Patient Active Problem List   Diagnosis Date Noted   Seizure (HCC) 11/05/2021   AKI (acute kidney injury) (HCC) 11/05/2021   Altered mental status, unspecified 11/05/2021   Osteoarthritis of hip 06/18/2021   AMS (altered mental status) 03/12/2021   DM type 2 (diabetes mellitus, type 2) (HCC) 07/09/2014   Facial weakness 07/08/2014   Left leg weakness 07/08/2014   H/O: CVA (cerebrovascular accident) 07/08/2014   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 CT and MRI negative for acute changes. EEG 12/115: epileptogenicity as well as cortical dysfunction in left frontotemporal region likely secondary to underlying stroke. PMH - multiple CVA with residual expressive aphasia, dementia, HTN, DM, seizure.   Assessment / Plan / Recommendation Clinical Impression  Pt presents with acute on chronic aphasia with dysfluencies, dysnomia, difficulty following two-step commands.  She is able to communicate in sentences with halting production, phonemic paraphasias, multiple attempts to self-correct.  [name] states that her speech is "leveling out" and close to baseline.  She declined follow-up speech therapy. Given that she will have HH PT, she could certainly resume speech therapy should she decide that she might benefit. Otherwise, no further acute SLP f/u is needed.    SLP  Assessment  SLP Recommendation/Assessment: Patient does not need any further Speech Lanaguage Pathology Services SLP Visit Diagnosis: Aphasia (R47.01)    Recommendations for follow up therapy are one component of a multi-disciplinary discharge planning process, led by the attending physician.  Recommendations may be updated based on patient status, additional functional criteria and insurance authorization.    Follow Up Recommendations  Home health SLP    Assistance Recommended at Discharge  Frequent or constant Supervision/Assistance  Functional Status Assessment Patient has had a recent decline in their functional status and demonstrates the ability to make significant improvements in function in a reasonable and predictable amount of time.  Frequency and Duration           SLP Evaluation Cognition  Overall Cognitive Status: Difficult to assess Arousal/Alertness: Awake/alert Orientation Level: Oriented to person Attention: Sustained       Comprehension  Auditory Comprehension Overall Auditory Comprehension: Impaired at baseline Yes/No Questions: Within Functional Limits Commands: Impaired Two Step Basic Commands: 50-74% accurate Conversation: Simple Reading Comprehension Reading Status: Not tested    Expression Expression Primary Mode of Expression: Verbal Verbal Expression Overall Verbal Expression: Impaired at baseline Initiation: No impairment Automatic Speech:  (impaired automatic sequences) Level of Generative/Spontaneous Verbalization: Sentence Repetition: Impaired Level of Impairment: Word level Naming: Impairment Verbal Errors: Phonemic paraphasias Pragmatics: No impairment Written Expression Dominant Hand: Right Written Expression: Not tested   Oral / Motor  Oral Motor/Sensory Function Overall Oral Motor/Sensory Function: Within functional limits Motor Speech Overall Motor Speech: Impaired Articulation: Impaired Level of Impairment: Sentence  [name] 11/06/2021, 9:49 AM [name] L. Samson Frederic, MA CCC/SLP Acute Rehabilitation Services Office number [phone] Pager [phone]

## 2021-11-06 NOTE — Progress Notes (Signed)
Physical Therapy Treatment Patient Details Name: [name] MRN: [id number] DOB: 1946/02/14 Today's Date: 11/06/2021   History of Present Illness Pt presented to ED on 12/14 with AMS and code stroke called for aphasia. CT and MRI negative for acute changes.  PMH - multiple CVA with residual expressive aphasia, dementia, HTN, DM, seizure.    PT Comments    Patient progressing this session with mobility and no episodes of freezing.  Still somewhat aphasic, but getting her point across for the most part.  Balanced at sink to clean her dentures with S.  She will benefit from follow up HHPT at d/c.  Noted for d/c today.   Recommendations for follow up therapy are one component of a multi-disciplinary discharge planning process, led by the attending physician.  Recommendations may be updated based on patient status, additional functional criteria and insurance authorization.  Follow Up Recommendations  Home health PT     Assistance Recommended at Discharge Frequent or constant Supervision/Assistance  Equipment Recommendations  None recommended by PT    Recommendations for Other Services       Precautions / Restrictions Precautions Precautions: Fall     Mobility  Bed Mobility Overal bed mobility: Needs Assistance Bed Mobility: Supine to Sit;Sit to Supine     Supine to sit: Min guard;HOB elevated Sit to supine: HOB elevated;Min guard   General bed mobility comments: assist for balance to come upright, assist for positioning to supine    Transfers Overall transfer level: Needs assistance Equipment used: Rolling walker (2 wheels) Transfers: Sit to/from Stand Sit to Stand: Min guard           General transfer comment: up to stand with assist for balance, to sit pt with uncontrolled descent onto bed, stopped in armchair in the room briefly to allow bed linen to be fixed and pt able to sit with control using armrests    Ambulation/Gait Ambulation/Gait assistance:  Min assist;Min guard Gait Distance (Feet): 90 Feet Assistive device: Rolling walker (2 wheels) Gait Pattern/deviations: Step-through pattern;Step-to pattern;Decreased stride length       General Gait Details: slow pace and initially mild limp on L, but improved over time, slight increased assist on turns   Optometrist    Modified Rankin (Stroke Patients Only)       Balance Overall balance assessment: Needs assistance Sitting-balance support: Feet supported Sitting balance-[name] Scale: Fair     Standing balance support: Bilateral upper extremity supported;Single extremity supported;No upper extremity supported Standing balance-[name] Scale: Fair Standing balance comment: stood at sink to brush teeth and able to balance with S without UE support                            Cognition Arousal/Alertness: Awake/alert Behavior During Therapy: WFL for tasks assessed/performed Overall Cognitive Status: No family/caregiver present to determine baseline cognitive functioning                                 General Comments: still with expressive aphasia, but attempting to communicate, aware daughter coming to take her home today Functional Status Assessment: Patient has had a recent decline in their functional status and demonstrates the ability to make significant improvements in function in a reasonable and predictable amount of time.      Exercises  General Comments        Pertinent Vitals/Pain Pain Assessment: No/denies pain    Home Living     Available Help at Discharge: Family;Available 24 hours/day Type of Home: House                  Prior Function            PT Goals (current goals can now be found in the care plan section) Progress towards PT goals: Progressing toward goals    Frequency    Min 3X/week      PT Plan Current plan remains appropriate    Co-evaluation               AM-PAC PT "6 Clicks" Mobility   Outcome Measure  Help needed turning from your back to your side while in a flat bed without using bedrails?: A Little Help needed moving from lying on your back to sitting on the side of a flat bed without using bedrails?: A Little Help needed moving to and from a bed to a chair (including a wheelchair)?: A Little Help needed standing up from a chair using your arms (e.g., wheelchair or bedside chair)?: A Little Help needed to walk in hospital room?: A Little Help needed climbing 3-5 steps with a railing? : A Lot 6 Click Score: 17    End of Session Equipment Utilized During Treatment: Gait belt Activity Tolerance: Patient tolerated treatment well Patient left: in bed;with call bell/phone within reach;with bed alarm set   PT Visit Diagnosis: Other abnormalities of gait and mobility (R26.89);Unsteadiness on feet (R26.81);Difficulty in walking, not elsewhere classified (R26.2)     Time: 1110-1135 PT Time Calculation (min) (ACUTE ONLY): 25 min  Charges:  $Gait Training: 8-22 mins $Therapeutic Activity: 8-22 mins                     [name], PT Acute Rehabilitation Services [phone] [phone] 11/06/2021    [name] 11/06/2021, 12:42 PM

## 2021-11-06 NOTE — Plan of Care (Signed)

## 2021-11-07 LAB — GLUCOSE, CAPILLARY: Glucose-Capillary: 117 mg/dL — ABNORMAL HIGH (ref 70–99)

## 2021-11-09 ENCOUNTER — Telehealth: Payer: Self-pay

## 2021-11-09 NOTE — Telephone Encounter (Signed)
Transition Care Management Unsuccessful Follow-up Telephone Call  Date of discharge and from where:  11/12/2021, [facility]  Attempts:  1st Attempt  Reason for unsuccessful TCM follow-up call:  Left voice message on # [phone]. This is also the phone number for patient's daughter, [name].   Need to discuss scheduling a hospital follow up appointment with Dr Alvis Lemmings @ [facility]

## 2021-11-10 ENCOUNTER — Telehealth: Payer: Self-pay

## 2021-11-10 NOTE — Telephone Encounter (Signed)
Transition Care Management Unsuccessful Follow-up Telephone Call  Date of discharge and from where:  11/06/2021, [facility]   Attempts:  2nd Attempt  Reason for unsuccessful TCM follow-up call:  Left voice messages on # [phone] and [phone] ( daughter, Brenda's number)   Need to discuss scheduling a hospital follow up appointment with Dr Alvis Lemmings @ [facility]

## 2021-11-11 ENCOUNTER — Telehealth: Payer: Self-pay

## 2021-11-11 NOTE — Telephone Encounter (Signed)
Transition Care Management Unsuccessful Follow-up Telephone Call  Date of discharge and from where:  11/06/2021, [facility]  Attempts:  3rd Attempt  Reason for unsuccessful TCM follow-up call:  Left voice messages on # [phone] and [phone] ( daughter, Brenda's number). Call back requested to this CM. Call also placed to # [phone] and the recording stated that the phone was not in service.  Letter sent to patient requesting she call [facility] to schedule a follow up appointment as we have not been able to reach her.

## 2021-11-12 LAB — LACOSAMIDE: Lacosamide: 4.3 ug/mL — ABNORMAL LOW (ref 5.0–10.0)

## 2021-11-13 ENCOUNTER — Other Ambulatory Visit: Payer: Self-pay

## 2021-11-16 NOTE — Progress Notes (Signed)
I was asked to specify "encephalopathy". In my opinion - she had clinically multifactorial toxic metabolic encephalopathy. Hope that clears the query.  -- Milon Dikes, MD Neurologist [facility] Pager: [phone]

## 2021-11-20 ENCOUNTER — Other Ambulatory Visit: Payer: Self-pay

## 2021-11-20 ENCOUNTER — Other Ambulatory Visit: Payer: Self-pay | Admitting: Family Medicine

## 2021-11-20 DIAGNOSIS — Z8673 Personal history of transient ischemic attack (TIA), and cerebral infarction without residual deficits: Secondary | ICD-10-CM

## 2021-11-20 DIAGNOSIS — E1169 Type 2 diabetes mellitus with other specified complication: Secondary | ICD-10-CM

## 2021-11-20 MED ORDER — CLOPIDOGREL BISULFATE 75 MG PO TABS
ORAL_TABLET | Freq: Every day | ORAL | 0 refills | Status: DC
  Filled 2021-11-20: qty 30, 30d supply, fill #0

## 2021-11-20 MED ORDER — METFORMIN HCL 500 MG PO TABS
ORAL_TABLET | Freq: Two times a day (BID) | ORAL | 0 refills | Status: DC
  Filled 2021-11-20: qty 120, 30d supply, fill #0

## 2021-11-20 MED FILL — Lisinopril Tab 10 MG: ORAL | 30 days supply | Qty: 30 | Fill #7 | Status: AC

## 2021-11-20 NOTE — Telephone Encounter (Signed)
Requested Prescriptions  Pending Prescriptions Disp Refills   clopidogrel (PLAVIX) 75 MG tablet 30 tablet 0    Sig: Take1 tablet by mouth daily.     Hematology: Antiplatelets - clopidogrel Failed - 11/20/2021 12:00 PM      Failed - Evaluate AST, ALT within 2 months of therapy initiation.      Failed - AST in normal range and within 360 days    AST  Date Value Ref Range Status  11/06/2021 14 (L) 15 - 41 U/L Final         Passed - ALT in normal range and within 360 days    ALT  Date Value Ref Range Status  11/06/2021 12 0 - 44 U/L Final         Passed - HCT in normal range and within 180 days    HCT  Date Value Ref Range Status  11/06/2021 39.4 36.0 - 46.0 % Final   Hematocrit  Date Value Ref Range Status  01/01/2021 42.2 34.0 - 46.6 % Final         Passed - HGB in normal range and within 180 days    Hemoglobin  Date Value Ref Range Status  11/06/2021 12.6 12.0 - 15.0 g/dL Final  01/01/2021 13.9 11.1 - 15.9 g/dL Final         Passed - PLT in normal range and within 180 days    Platelets  Date Value Ref Range Status  11/06/2021 286 150 - 400 K/uL Final  01/01/2021 331 150 - 450 x10E3/uL Final         Passed - Valid encounter within last 6 months    Recent Outpatient Visits          5 months ago Essential hypertension   [place], [place], MD   7 months ago Type 2 diabetes mellitus with other specified complication, without long-term current use of insulin ([place])   [place], [place], MD   10 months ago Type 2 diabetes mellitus with other specified complication, without long-term current use of insulin [facility])   [place], [place], [place]   1 year ago Type 2 diabetes mellitus with other specified complication, without long-term current use of insulin ([place])   [place], [place], MD      Future Appointments             In 3 weeks Duck, Dionne Bucy, PA-C [place]            metFORMIN (GLUCOPHAGE) 500 MG tablet 120 tablet 0    Sig: TAKE 2 TABLETS (1,000 MG TOTAL) BY MOUTH 2 (TWO) TIMES DAILY WITH A MEAL.     Endocrinology:  Diabetes - Biguanides Failed - 11/20/2021 12:00 PM      Failed - Cr in normal range and within 360 days    Creatinine, Ser  Date Value Ref Range Status  11/06/2021 1.06 (H) 0.44 - 1.00 mg/dL Final         Passed - HBA1C is between 0 and 7.9 and within 180 days    HbA1c, POC (controlled diabetic range)  Date Value Ref Range Status  01/09/2020 7.6 (A) 0.0 - 7.0 % Final   Hgb A1c MFr Bld  Date Value Ref Range Status  11/05/2021 7.3 (H) 4.8 - 5.6 % Final    Comment:    (NOTE) Pre diabetes:  5.7%-6.4%  Diabetes:              >6.4%  Glycemic control for   <7.0% adults with diabetes          Passed - AA eGFR in normal range and within 360 days    GFR calc Af Amer  Date Value Ref Range Status  01/01/2021 65 >59 mL/min/1.73 Final    Comment:    **In accordance with recommendations from the NKF-ASN Task force,**   [facility] is in the process of updating its eGFR calculation to the   2021 CKD-EPI creatinine equation that estimates kidney function   without a race variable.    GFR, Estimated  Date Value Ref Range Status  11/06/2021 55 (L) >60 mL/min Final    Comment:    (NOTE) Calculated using the CKD-EPI Creatinine Equation (2021)          Passed - Valid encounter within last 6 months    Recent Outpatient Visits          5 months ago Essential hypertension   [place], [place], MD   7 months ago Type 2 diabetes mellitus with other specified complication, without long-term current use of insulin ([place])   [place], [place], MD   10 months ago Type 2 diabetes mellitus with other specified complication, without long-term current use of insulin  [facility])   [place], [place], [place]   1 year ago Type 2 diabetes mellitus with other specified complication, without long-term current use of insulin [facility])   [place], [place], MD      Future Appointments            In 3 weeks [place], Dionne Bucy, PA-C [place]

## 2021-11-23 ENCOUNTER — Other Ambulatory Visit: Payer: Self-pay

## 2021-12-08 ENCOUNTER — Other Ambulatory Visit: Payer: Self-pay

## 2021-12-11 ENCOUNTER — Other Ambulatory Visit: Payer: Self-pay

## 2021-12-14 ENCOUNTER — Other Ambulatory Visit: Payer: Self-pay

## 2021-12-14 ENCOUNTER — Encounter: Payer: Self-pay | Admitting: Psychology

## 2021-12-14 MED ORDER — LACOSAMIDE 100 MG PO TABS
ORAL_TABLET | ORAL | 4 refills | Status: DC
  Filled 2021-12-14: qty 60, 30d supply, fill #0
  Filled 2022-01-29: qty 60, 30d supply, fill #1
  Filled 2022-04-16: qty 60, 30d supply, fill #2

## 2021-12-15 ENCOUNTER — Other Ambulatory Visit: Payer: Self-pay

## 2021-12-17 ENCOUNTER — Other Ambulatory Visit: Payer: Self-pay

## 2021-12-17 ENCOUNTER — Ambulatory Visit: Payer: Medicare Other | Attending: Physician Assistant | Admitting: Physician Assistant

## 2021-12-17 DIAGNOSIS — Z8673 Personal history of transient ischemic attack (TIA), and cerebral infarction without residual deficits: Secondary | ICD-10-CM

## 2021-12-17 DIAGNOSIS — Z09 Encounter for follow-up examination after completed treatment for conditions other than malignant neoplasm: Secondary | ICD-10-CM | POA: Diagnosis not present

## 2021-12-17 DIAGNOSIS — E1169 Type 2 diabetes mellitus with other specified complication: Secondary | ICD-10-CM

## 2021-12-17 DIAGNOSIS — I10 Essential (primary) hypertension: Secondary | ICD-10-CM

## 2021-12-17 MED ORDER — ATORVASTATIN CALCIUM 20 MG PO TABS
20.0000 mg | ORAL_TABLET | Freq: Every day | ORAL | 1 refills | Status: DC
  Filled 2021-12-17 – 2021-12-30 (×2): qty 30, 30d supply, fill #0
  Filled 2022-02-15: qty 30, 30d supply, fill #1

## 2021-12-17 MED ORDER — CLOPIDOGREL BISULFATE 75 MG PO TABS
ORAL_TABLET | Freq: Every day | ORAL | 0 refills | Status: DC
  Filled 2021-12-17 – 2021-12-30 (×2): qty 30, 30d supply, fill #0
  Filled 2022-02-15: qty 30, 30d supply, fill #1
  Filled 2022-03-25: qty 30, 30d supply, fill #2

## 2021-12-17 MED ORDER — LISINOPRIL 10 MG PO TABS
ORAL_TABLET | Freq: Every day | ORAL | 3 refills | Status: DC
  Filled 2021-12-17 – 2021-12-30 (×2): qty 30, 30d supply, fill #0
  Filled 2022-02-15: qty 30, 30d supply, fill #1
  Filled 2022-03-25: qty 30, 30d supply, fill #2
  Filled 2022-04-27: qty 30, 30d supply, fill #3
  Filled 2022-06-01: qty 30, 30d supply, fill #4
  Filled 2022-07-13: qty 30, 30d supply, fill #5
  Filled 2022-08-06: qty 30, 30d supply, fill #6

## 2021-12-17 MED ORDER — METFORMIN HCL 500 MG PO TABS
ORAL_TABLET | Freq: Two times a day (BID) | ORAL | 0 refills | Status: DC
Start: 2021-12-17 — End: 2022-04-27
  Filled 2021-12-17 – 2021-12-30 (×2): qty 120, 30d supply, fill #0
  Filled 2022-01-29: qty 240, 60d supply, fill #1

## 2021-12-17 MED ORDER — ATENOLOL 25 MG PO TABS
25.0000 mg | ORAL_TABLET | Freq: Every day | ORAL | 1 refills | Status: DC
  Filled 2021-12-17 – 2021-12-30 (×2): qty 30, 30d supply, fill #0
  Filled 2022-02-15: qty 30, 30d supply, fill #1
  Filled 2022-03-25: qty 90, 90d supply, fill #2
  Filled 2022-07-19: qty 30, 30d supply, fill #3

## 2021-12-17 NOTE — Progress Notes (Signed)
Patient ID: [name], female   DOB: Aug 08, 1946, 76 y.o.   MRN: [id number]  Virtual Visit via Telephone Note  I connected with [name] on 12/17/21 at  8:50 AM EST by telephone and verified that I am speaking with the correct person using two identifiers.  Location: Patient: home Provider: my home office Patient's daughter [name] is speaking for the patient due to patient having aphasia   I discussed the limitations, risks, security and privacy concerns of performing an evaluation and management service by telephone and the availability of in person appointments. I also discussed with the patient that there may be a patient responsible charge related to this service. The patient expressed understanding and agreed to proceed.   History of Present Illness:  [name] was hospitalized for evaluation of worsening aphasia and confusion 12/14-12/16/2022.  No new intracranial process was identified.  She has not seen neurology in follow-up yet and needs subsequent RF on some of her meds.  I am changing her Rx to 90 days for convenience.  Her family says she is back to her pre-hospitalization mental status.  No new issues or concerns.     Brief/Interim Summary: 76 y.o. female with medical history significant for multiple CVA with residual expressive aphasia, dementia, Type 2 DM, seizure presented with worsening confusion and speech difficulties.  On presentation, CT of the head and CTA head and neck were negative other than chronic left M1 occlusion.  Neurology was consulted.  MRI of brain was without any acute intracranial process.  EEG was negative for any seizure activities.  Vitamin B12 was 167; supplementation was started.  Mental status has improved.  Neurology has cleared the patient for discharge.  She will be discharged home today with outpatient follow-up with PCP and neurology.   Discharge Diagnoses:    Expressive aphasia in a patient with history of multiple  CVA with residual expressive aphasia History of seizures -Patient presented with worsening confusion and speech difficulties.  CT of the head and CTA head and neck were negative other than chronic left M1 occlusion. -MRI of brain without contrast was negative for acute intracranial process. -EEG negative for seizures. -Continue aspirin, statin, Plavix.   -Continue lacosamide -Neurology recommended continuing current dose of lacosamide along with vitamin B12 supplementation and outpatient follow-up with neurology. -PT/SLP recommended home health PT/SLP   B12 deficiency -Vitamin B12 167.  Treated parenterally in the hospital.  Continue oral supplementation on discharge.  We will also empirically treat with oral folic acid.   AKI -Creatinine 1.5 on presentation; prior creatinine was 0.93.  Treated with IV fluids.  Resolved; creatinine 1.06 today.  Outpatient follow-up.     Hypertension -Blood pressure intermittently on the higher side.  Resume atenolol and lisinopril.   Diabetes mellitus type 2 -A1c 7.3.  Carb modified diet.  Resume metformin.  Obesity -Outpatient follow-up   Observations/Objective:  NAD per patient's daughter.   Assessment and Plan: 1. Essential hypertension stable - lisinopril (ZESTRIL) 10 MG tablet; TAKE 1 TABLET (10 MG TOTAL) BY MOUTH DAILY.  Dispense: 90 tablet; Refill: 3 - atenolol (TENORMIN) 25 MG tablet; Take 1 tablet (25 mg total) by mouth daily.  Dispense: 90 tablet; Refill: 1  2. Type 2 diabetes mellitus with other specified complication, without long-term current use of insulin (HCC) A1C=7.3 in hospital - metFORMIN (GLUCOPHAGE) 500 MG tablet; TAKE 2 TABLETS (1,000 MG TOTAL) BY MOUTH 2 (TWO) TIMES DAILY WITH A MEAL.  Dispense: 360 tablet; Refill: 0 -  atorvastatin (LIPITOR) 20 MG tablet; Take 1 tablet (20 mg total) by mouth daily.  Dispense: 90 tablet; Refill: 1  3. H/O: CVA (cerebrovascular accident) - atorvastatin (LIPITOR) 20 MG tablet; Take 1 tablet  (20 mg total) by mouth daily.  Dispense: 90 tablet; Refill: 1 - clopidogrel (PLAVIX) 75 MG tablet; Take1 tablet by mouth daily.  Dispense: 90 tablet; Refill: 0  4. Hospital discharge follow-up Doing well/back to baseline.  Daughter to call for neuro follow up   Follow Up Instructions: See PCP in about 2 months for chronic conditions   I discussed the assessment and treatment plan with the patient. The patient was provided an opportunity to ask questions and all were answered. The patient agreed with the plan and demonstrated an understanding of the instructions.   The patient was advised to call back or seek an in-person evaluation if the symptoms worsen or if the condition fails to improve as anticipated.  I provided 15 minutes of non-face-to-face time during this encounter.   Misty Co, PA-C

## 2021-12-24 ENCOUNTER — Other Ambulatory Visit: Payer: Self-pay

## 2021-12-30 ENCOUNTER — Other Ambulatory Visit: Payer: Self-pay

## 2021-12-31 ENCOUNTER — Other Ambulatory Visit: Payer: Self-pay

## 2022-01-29 ENCOUNTER — Other Ambulatory Visit: Payer: Self-pay

## 2022-02-04 ENCOUNTER — Other Ambulatory Visit: Payer: Self-pay

## 2022-02-15 ENCOUNTER — Other Ambulatory Visit: Payer: Self-pay

## 2022-02-16 ENCOUNTER — Ambulatory Visit: Payer: Medicare Other | Admitting: Family Medicine

## 2022-02-19 ENCOUNTER — Other Ambulatory Visit: Payer: Self-pay

## 2022-02-19 ENCOUNTER — Inpatient Hospital Stay (HOSPITAL_COMMUNITY)
Admission: EM | Admit: 2022-02-19 | Discharge: 2022-02-20 | DRG: 247 | Disposition: A | Discharge status: Home / Self Care | Payer: Medicare Other | Attending: Cardiology | Admitting: Cardiology

## 2022-02-19 ENCOUNTER — Emergency Department (HOSPITAL_COMMUNITY): Payer: Medicare Other

## 2022-02-19 ENCOUNTER — Inpatient Hospital Stay (HOSPITAL_COMMUNITY): Payer: Medicare Other

## 2022-02-19 ENCOUNTER — Encounter (HOSPITAL_COMMUNITY): Payer: Self-pay | Admitting: Cardiology

## 2022-02-19 ENCOUNTER — Encounter (HOSPITAL_COMMUNITY)
Admission: EM | Disposition: A | Discharge status: Home / Self Care | Payer: Self-pay | Source: Home / Self Care | Attending: Cardiology

## 2022-02-19 DIAGNOSIS — Z833 Family history of diabetes mellitus: Secondary | ICD-10-CM | POA: Diagnosis not present

## 2022-02-19 DIAGNOSIS — Z880 Allergy status to penicillin: Secondary | ICD-10-CM

## 2022-02-19 DIAGNOSIS — Z7902 Long term (current) use of antithrombotics/antiplatelets: Secondary | ICD-10-CM

## 2022-02-19 DIAGNOSIS — I2510 Atherosclerotic heart disease of native coronary artery without angina pectoris: Secondary | ICD-10-CM | POA: Diagnosis not present

## 2022-02-19 DIAGNOSIS — Z8673 Personal history of transient ischemic attack (TIA), and cerebral infarction without residual deficits: Secondary | ICD-10-CM

## 2022-02-19 DIAGNOSIS — Z7984 Long term (current) use of oral hypoglycemic drugs: Secondary | ICD-10-CM | POA: Diagnosis not present

## 2022-02-19 DIAGNOSIS — E1169 Type 2 diabetes mellitus with other specified complication: Secondary | ICD-10-CM

## 2022-02-19 DIAGNOSIS — I2121 ST elevation (STEMI) myocardial infarction involving left circumflex coronary artery: Principal | ICD-10-CM | POA: Diagnosis present

## 2022-02-19 DIAGNOSIS — Z8249 Family history of ischemic heart disease and other diseases of the circulatory system: Secondary | ICD-10-CM | POA: Diagnosis not present

## 2022-02-19 DIAGNOSIS — I213 ST elevation (STEMI) myocardial infarction of unspecified site: Secondary | ICD-10-CM

## 2022-02-19 DIAGNOSIS — Z7982 Long term (current) use of aspirin: Secondary | ICD-10-CM | POA: Diagnosis not present

## 2022-02-19 DIAGNOSIS — E119 Type 2 diabetes mellitus without complications: Secondary | ICD-10-CM | POA: Diagnosis not present

## 2022-02-19 DIAGNOSIS — F17210 Nicotine dependence, cigarettes, uncomplicated: Secondary | ICD-10-CM | POA: Diagnosis present

## 2022-02-19 DIAGNOSIS — I69354 Hemiplegia and hemiparesis following cerebral infarction affecting left non-dominant side: Secondary | ICD-10-CM | POA: Diagnosis not present

## 2022-02-19 DIAGNOSIS — Z882 Allergy status to sulfonamides status: Secondary | ICD-10-CM

## 2022-02-19 DIAGNOSIS — Z823 Family history of stroke: Secondary | ICD-10-CM | POA: Diagnosis not present

## 2022-02-19 DIAGNOSIS — F0390 Unspecified dementia without behavioral disturbance: Secondary | ICD-10-CM | POA: Diagnosis present

## 2022-02-19 DIAGNOSIS — I10 Essential (primary) hypertension: Secondary | ICD-10-CM | POA: Diagnosis not present

## 2022-02-19 DIAGNOSIS — Z79899 Other long term (current) drug therapy: Secondary | ICD-10-CM | POA: Diagnosis not present

## 2022-02-19 DIAGNOSIS — Z955 Presence of coronary angioplasty implant and graft: Secondary | ICD-10-CM

## 2022-02-19 DIAGNOSIS — Z20822 Contact with and (suspected) exposure to covid-19: Secondary | ICD-10-CM | POA: Diagnosis not present

## 2022-02-19 HISTORY — PX: LEFT HEART CATH AND CORONARY ANGIOGRAPHY: CATH118249

## 2022-02-19 HISTORY — DX: ST elevation (STEMI) myocardial infarction of unspecified site: I21.3

## 2022-02-19 HISTORY — PX: CORONARY/GRAFT ACUTE MI REVASCULARIZATION: CATH118305

## 2022-02-19 HISTORY — PX: CORONARY STENT INTERVENTION: CATH118234

## 2022-02-19 LAB — CBC WITH DIFFERENTIAL/PLATELET
Abs Immature Granulocytes: 0.03 10*3/uL (ref 0.00–0.07)
Basophils Absolute: 0.1 10*3/uL (ref 0.0–0.1)
Basophils Relative: 1 %
Eosinophils Absolute: 0.1 10*3/uL (ref 0.0–0.5)
Eosinophils Relative: 1 %
HCT: 39.8 % (ref 36.0–46.0)
Hemoglobin: 13 g/dL (ref 12.0–15.0)
Immature Granulocytes: 0 %
Lymphocytes Relative: 30 %
Lymphs Abs: 2.9 10*3/uL (ref 0.7–4.0)
MCH: 27.5 pg (ref 26.0–34.0)
MCHC: 32.7 g/dL (ref 30.0–36.0)
MCV: 84.3 fL (ref 80.0–100.0)
Monocytes Absolute: 0.4 10*3/uL (ref 0.1–1.0)
Monocytes Relative: 5 %
Neutro Abs: 6 10*3/uL (ref 1.7–7.7)
Neutrophils Relative %: 63 %
Platelets: 260 10*3/uL (ref 150–400)
RBC: 4.72 MIL/uL (ref 3.87–5.11)
RDW: 14.6 % (ref 11.5–15.5)
WBC: 9.5 10*3/uL (ref 4.0–10.5)
nRBC: 0 % (ref 0.0–0.2)

## 2022-02-19 LAB — COMPREHENSIVE METABOLIC PANEL
ALT: 14 U/L (ref 0–44)
AST: 25 U/L (ref 15–41)
Albumin: 3.4 g/dL — ABNORMAL LOW (ref 3.5–5.0)
Alkaline Phosphatase: 64 U/L (ref 38–126)
Anion gap: 8 (ref 5–15)
BUN: 9 mg/dL (ref 8–23)
CO2: 24 mmol/L (ref 22–32)
Calcium: 8.7 mg/dL — ABNORMAL LOW (ref 8.9–10.3)
Chloride: 105 mmol/L (ref 98–111)
Creatinine, Ser: 1.06 mg/dL — ABNORMAL HIGH (ref 0.44–1.00)
GFR, Estimated: 55 mL/min — ABNORMAL LOW (ref >60)
Glucose, Bld: 175 mg/dL — ABNORMAL HIGH (ref 70–99)
Potassium: 4.1 mmol/L (ref 3.5–5.1)
Sodium: 137 mmol/L (ref 135–145)
Total Bilirubin: 0.8 mg/dL (ref 0.3–1.2)
Total Protein: 6.1 g/dL — ABNORMAL LOW (ref 6.5–8.1)

## 2022-02-19 LAB — POCT I-STAT, CHEM 8
BUN: 9 mg/dL (ref 8–23)
Calcium, Ion: 1.14 mmol/L — ABNORMAL LOW (ref 1.15–1.40)
Chloride: 100 mmol/L (ref 98–111)
Creatinine, Ser: 0.7 mg/dL (ref 0.44–1.00)
Glucose, Bld: 146 mg/dL — ABNORMAL HIGH (ref 70–99)
HCT: 35 % — ABNORMAL LOW (ref 36.0–46.0)
Hemoglobin: 11.9 g/dL — ABNORMAL LOW (ref 12.0–15.0)
Potassium: 3.8 mmol/L (ref 3.5–5.1)
Sodium: 136 mmol/L (ref 135–145)
TCO2: 24 mmol/L (ref 22–32)

## 2022-02-19 LAB — ECHOCARDIOGRAM COMPLETE
AR max vel: 1.76 cm2
AV Area VTI: 2.22 cm2
AV Area mean vel: 1.83 cm2
AV Mean grad: 4 mmHg
AV Peak grad: 8.2 mmHg
Ao pk vel: 1.43 m/s
Area-P 1/2: 4.31 cm2
Calc EF: 52.4 %
Height: 63 in
MV VTI: 1.56 cm2
S' Lateral: 2.9 cm
Single Plane A2C EF: 56.7 %
Single Plane A4C EF: 54 %
Weight: 2800 oz

## 2022-02-19 LAB — GLUCOSE, CAPILLARY
Glucose-Capillary: 148 mg/dL — ABNORMAL HIGH (ref 70–99)
Glucose-Capillary: 167 mg/dL — ABNORMAL HIGH (ref 70–99)
Glucose-Capillary: 139 mg/dL — ABNORMAL HIGH (ref 70–99)
Glucose-Capillary: 169 mg/dL — ABNORMAL HIGH (ref 70–99)

## 2022-02-19 LAB — LIPID PANEL
Cholesterol: 165 mg/dL (ref 0–200)
HDL: 51 mg/dL (ref >40)
LDL Cholesterol: 84 mg/dL (ref 0–99)
Total CHOL/HDL Ratio: 3.2 RATIO
Triglycerides: 148 mg/dL (ref <150)
VLDL: 30 mg/dL (ref 0–40)

## 2022-02-19 LAB — RESP PANEL BY RT-PCR (FLU A&B, COVID) ARPGX2
Influenza A by PCR: NEGATIVE
Influenza B by PCR: NEGATIVE
SARS Coronavirus 2 by RT PCR: NEGATIVE

## 2022-02-19 LAB — APTT: aPTT: 24 seconds (ref 24–36)

## 2022-02-19 LAB — MRSA NEXT GEN BY PCR, NASAL: MRSA by PCR Next Gen: NOT DETECTED

## 2022-02-19 LAB — TROPONIN I (HIGH SENSITIVITY)
Troponin I (High Sensitivity): 656 ng/L (ref <18)
Troponin I (High Sensitivity): 1300 ng/L (ref <18)

## 2022-02-19 LAB — PROTIME-INR
INR: 1 (ref 0.8–1.2)
Prothrombin Time: 13.3 seconds (ref 11.4–15.2)

## 2022-02-19 LAB — HEMOGLOBIN A1C
Hgb A1c MFr Bld: 7.2 % — ABNORMAL HIGH (ref 4.8–5.6)
Mean Plasma Glucose: 159.94 mg/dL

## 2022-02-19 LAB — POCT ACTIVATED CLOTTING TIME: Activated Clotting Time: 293 seconds

## 2022-02-19 SURGERY — LEFT HEART CATH AND CORONARY ANGIOGRAPHY
Anesthesia: LOCAL

## 2022-02-19 MED ORDER — CHLORHEXIDINE GLUCONATE CLOTH 2 % EX PADS: 6.0000 | MEDICATED_PAD | Freq: Every day | CUTANEOUS | Status: DC

## 2022-02-19 MED ORDER — ATORVASTATIN CALCIUM 40 MG PO TABS
40.0000 mg | ORAL_TABLET | Freq: Every day | ORAL | Status: DC
  Administered 2022-02-19 – 2022-02-20 (×2): 40 mg via ORAL
  Filled 2022-02-19 (×2): qty 1

## 2022-02-19 MED ORDER — NITROGLYCERIN 1 MG/10 ML FOR IR/CATH LAB
INTRA_ARTERIAL | Status: DC | PRN
Start: 2022-02-19 — End: 2022-02-19
  Administered 2022-02-19: 200 ug via INTRACORONARY

## 2022-02-19 MED ORDER — IOHEXOL 350 MG/ML SOLN
INTRAVENOUS | Status: DC | PRN
  Administered 2022-02-19: 70 mL

## 2022-02-19 MED ORDER — TRAMADOL HCL 50 MG PO TABS: 50.0000 mg | ORAL_TABLET | Freq: Every evening | ORAL | Status: DC | PRN

## 2022-02-19 MED ORDER — LIDOCAINE HCL (PF) 1 % IJ SOLN
INTRAMUSCULAR | Status: AC
  Filled 2022-02-19: qty 30

## 2022-02-19 MED ORDER — HYDRALAZINE HCL 20 MG/ML IJ SOLN
INTRAMUSCULAR | Status: DC | PRN
  Administered 2022-02-19: 10 mg via INTRAVENOUS

## 2022-02-19 MED ORDER — LACOSAMIDE 50 MG PO TABS
100.0000 mg | ORAL_TABLET | Freq: Two times a day (BID) | ORAL | Status: DC
  Administered 2022-02-19 – 2022-02-20 (×3): 100 mg via ORAL
  Filled 2022-02-19 (×3): qty 2

## 2022-02-19 MED ORDER — SODIUM CHLORIDE 0.9 % IV SOLN: INTRAVENOUS | Status: AC

## 2022-02-19 MED ORDER — VERAPAMIL HCL 2.5 MG/ML IV SOLN
INTRAVENOUS | Status: DC | PRN
  Administered 2022-02-19: 10 mL via INTRA_ARTERIAL

## 2022-02-19 MED ORDER — ONDANSETRON HCL 4 MG/2ML IJ SOLN: 4.0000 mg | Freq: Four times a day (QID) | INTRAMUSCULAR | Status: DC | PRN

## 2022-02-19 MED ORDER — HEPARIN (PORCINE) IN NACL 1000-0.9 UT/500ML-% IV SOLN
INTRAVENOUS | Status: AC
  Filled 2022-02-19: qty 1000

## 2022-02-19 MED ORDER — SODIUM CHLORIDE 0.9 % IV SOLN: 250.0000 mL | INTRAVENOUS | Status: DC | PRN

## 2022-02-19 MED ORDER — HEPARIN SODIUM (PORCINE) 1000 UNIT/ML IJ SOLN
INTRAMUSCULAR | Status: AC
  Filled 2022-02-19: qty 10

## 2022-02-19 MED ORDER — ATENOLOL 25 MG PO TABS
25.0000 mg | ORAL_TABLET | Freq: Every day | ORAL | Status: DC
  Administered 2022-02-19 – 2022-02-20 (×2): 25 mg via ORAL
  Filled 2022-02-19 (×2): qty 1

## 2022-02-19 MED ORDER — CLOPIDOGREL BISULFATE 300 MG PO TABS
ORAL_TABLET | ORAL | Status: DC | PRN
  Administered 2022-02-19: 600 mg via ORAL

## 2022-02-19 MED ORDER — HEPARIN SODIUM (PORCINE) 5000 UNIT/ML IJ SOLN
4000.0000 [IU] | Freq: Once | INTRAMUSCULAR | Status: AC
  Administered 2022-02-19: 4000 [IU] via INTRAVENOUS

## 2022-02-19 MED ORDER — INSULIN ASPART 100 UNIT/ML IJ SOLN
4.0000 [IU] | Freq: Three times a day (TID) | INTRAMUSCULAR | Status: DC
  Administered 2022-02-19 – 2022-02-20 (×4): 4 [IU] via SUBCUTANEOUS

## 2022-02-19 MED ORDER — HYDRALAZINE HCL 20 MG/ML IJ SOLN
INTRAMUSCULAR | Status: AC
  Filled 2022-02-19: qty 1

## 2022-02-19 MED ORDER — SODIUM CHLORIDE 0.9% FLUSH: 3.0000 mL | INTRAVENOUS | Status: DC | PRN

## 2022-02-19 MED ORDER — LIDOCAINE HCL (PF) 1 % IJ SOLN
INTRAMUSCULAR | Status: DC | PRN
  Administered 2022-02-19: 2 mL

## 2022-02-19 MED ORDER — INSULIN ASPART 100 UNIT/ML IJ SOLN
0.0000 [IU] | Freq: Three times a day (TID) | INTRAMUSCULAR | Status: DC
  Administered 2022-02-19: 3 [IU] via SUBCUTANEOUS
  Administered 2022-02-19: 2 [IU] via SUBCUTANEOUS
  Administered 2022-02-20: 5 [IU] via SUBCUTANEOUS
  Administered 2022-02-20: 3 [IU] via SUBCUTANEOUS

## 2022-02-19 MED ORDER — SODIUM CHLORIDE 0.9 % IV SOLN: INTRAVENOUS | Status: DC

## 2022-02-19 MED ORDER — CLOPIDOGREL BISULFATE 75 MG PO TABS
75.0000 mg | ORAL_TABLET | Freq: Every day | ORAL | Status: DC
Start: 2022-02-20 — End: 2022-02-19

## 2022-02-19 MED ORDER — CLOPIDOGREL BISULFATE 75 MG PO TABS
75.0000 mg | ORAL_TABLET | Freq: Every day | ORAL | Status: DC
  Administered 2022-02-20: 75 mg via ORAL
  Filled 2022-02-19: qty 1

## 2022-02-19 MED ORDER — HEPARIN SODIUM (PORCINE) 1000 UNIT/ML IJ SOLN
INTRAMUSCULAR | Status: DC | PRN
Start: 2022-02-19 — End: 2022-02-19
  Administered 2022-02-19: 4000 [IU] via INTRAVENOUS

## 2022-02-19 MED ORDER — LABETALOL HCL 5 MG/ML IV SOLN: 10.0000 mg | INTRAVENOUS | Status: AC | PRN

## 2022-02-19 MED ORDER — VERAPAMIL HCL 2.5 MG/ML IV SOLN
INTRAVENOUS | Status: AC
  Filled 2022-02-19: qty 2

## 2022-02-19 MED ORDER — HYDRALAZINE HCL 20 MG/ML IJ SOLN: 10.0000 mg | INTRAMUSCULAR | Status: AC | PRN

## 2022-02-19 MED ORDER — HEPARIN SODIUM (PORCINE) 5000 UNIT/ML IJ SOLN
INTRAMUSCULAR | Status: AC
  Filled 2022-02-19: qty 1

## 2022-02-19 MED ORDER — ACETAMINOPHEN 325 MG PO TABS: 650.0000 mg | ORAL_TABLET | ORAL | Status: DC | PRN

## 2022-02-19 MED ORDER — NITROGLYCERIN 1 MG/10 ML FOR IR/CATH LAB
INTRA_ARTERIAL | Status: AC
  Filled 2022-02-19: qty 10

## 2022-02-19 MED ORDER — HEPARIN (PORCINE) IN NACL 1000-0.9 UT/500ML-% IV SOLN
INTRAVENOUS | Status: DC | PRN
  Administered 2022-02-19 (×2): 500 mL

## 2022-02-19 MED ORDER — ASPIRIN 81 MG PO CHEW: 324.0000 mg | CHEWABLE_TABLET | Freq: Once | ORAL | Status: DC

## 2022-02-19 MED ORDER — ASPIRIN 81 MG PO CHEW
81.0000 mg | CHEWABLE_TABLET | Freq: Every morning | ORAL | Status: DC
  Administered 2022-02-20: 81 mg via ORAL
  Filled 2022-02-19: qty 1

## 2022-02-19 MED ORDER — LISINOPRIL 10 MG PO TABS
10.0000 mg | ORAL_TABLET | Freq: Every day | ORAL | Status: DC
Start: 2022-02-19 — End: 2022-02-20
  Administered 2022-02-19 – 2022-02-20 (×2): 10 mg via ORAL
  Filled 2022-02-19 (×2): qty 1

## 2022-02-19 MED ORDER — ACETAMINOPHEN 325 MG PO TABS: 650.0000 mg | ORAL_TABLET | Freq: Four times a day (QID) | ORAL | Status: DC | PRN

## 2022-02-19 MED ORDER — INSULIN ASPART 100 UNIT/ML IJ SOLN: 0.0000 [IU] | Freq: Every day | INTRAMUSCULAR | Status: DC

## 2022-02-19 MED ORDER — SODIUM CHLORIDE 0.9% FLUSH
3.0000 mL | Freq: Two times a day (BID) | INTRAVENOUS | Status: DC
  Administered 2022-02-19 – 2022-02-20 (×3): 3 mL via INTRAVENOUS

## 2022-02-19 MED ORDER — FUROSEMIDE 10 MG/ML IJ SOLN
40.0000 mg | Freq: Once | INTRAMUSCULAR | Status: AC
  Administered 2022-02-19: 40 mg via INTRAVENOUS
  Filled 2022-02-19: qty 4

## 2022-02-19 SURGICAL SUPPLY — 22 items
BALLN SAPPHIRE 3.0X15 (BALLOONS) ×2
BALLN SAPPHIRE ~~LOC~~ 3.25X15 (BALLOONS) ×1 IMPLANT
BALLOON SAPPHIRE 3.0X15 (BALLOONS) IMPLANT
CATH INFINITI 5 FR JL3.5 (CATHETERS) ×1 IMPLANT
CATH LAUNCHER 6FR EBU 3 (CATHETERS) ×1 IMPLANT
CATH VISTA GUIDE 6FR JR4 (CATHETERS) ×1 IMPLANT
CATH VISTA GUIDE 6FR XB3.5 (CATHETERS) ×1 IMPLANT
DEVICE RAD COMP TR BAND LRG (VASCULAR PRODUCTS) ×1 IMPLANT
GLIDESHEATH SLEND A-KIT 6F 22G (SHEATH) ×1 IMPLANT
GUIDEWIRE INQWIRE 1.5J.035X260 (WIRE) IMPLANT
INQWIRE 1.5J .035X260CM (WIRE) ×2
KIT ENCORE 26 ADVANTAGE (KITS) ×1 IMPLANT
KIT HEART LEFT (KITS) ×2 IMPLANT
PACK CARDIAC CATHETERIZATION (CUSTOM PROCEDURE TRAY) ×2 IMPLANT
SHEATH PROBE COVER 6X72 (BAG) ×1 IMPLANT
STENT SYNERGY XD 3.0X20 (Permanent Stent) IMPLANT
SYNERGY XD 3.0X20 (Permanent Stent) ×2 IMPLANT
TRANSDUCER W/STOPCOCK (MISCELLANEOUS) ×2 IMPLANT
TUBING CIL FLEX 10 FLL-RA (TUBING) ×2 IMPLANT
VALVE COPILOT STAT (MISCELLANEOUS) ×1 IMPLANT
WIRE ASAHI PROWATER 180CM (WIRE) ×1 IMPLANT
WIRE COUGAR XT STRL 190CM (WIRE) ×1 IMPLANT

## 2022-02-19 NOTE — ED Provider Notes (Signed)
?[facility]-EMERGENCY DEPT ?[facility] Emergency Department ?Provider Note ?MRN:  [id number]  ?Arrival date & time: 02/19/22    ? ?Chief Complaint   ?STEMI  ?History of Present Illness   ?[name] is a 76 y.o. year-old female with a history of CVA, CAD presenting to the ED with chief complaint of chest pain. ? ?Chest pain starting at 5 AM this morning, was found clutching her chest by family.  EMS noting ST elevations on twelve lead. ? ?Review of Systems  ?A thorough review of systems was obtained and all systems are negative except as noted in the HPI and PMH.  ? ?Patient's Health History   ? ?Past Medical History:  ?Diagnosis Date  ? Diabetes mellitus   ? H/O: CVA (cerebrovascular accident) 07/08/2014  ? Hypertension   ? Stroke [facility])   ?  ?Past Surgical History:  ?Procedure Laterality Date  ? NO PAST SURGERIES    ?  ?Family History  ?Problem Relation Age of Onset  ? Hyperlipidemia Mother   ? Hypertension Mother   ? Diabetes Mother   ? Stroke Father   ?  ?Social History  ? ?Socioeconomic History  ? Marital status: Widowed  ?  Spouse name: Not on file  ? Number of children: 3  ? Years of education: 22  ? Highest education level: High school graduate  ?Occupational History  ? Occupation: Retired  ?Tobacco Use  ? Smoking status: Every Day  ?  Packs/day: 1.00  ?  Years: 48.00  ?  Pack years: 48.00  ?  Types: Cigarettes  ? Smokeless tobacco: Never  ?Substance and Sexual Activity  ? Alcohol use: No  ? Drug use: No  ? Sexual activity: Not on file  ?Other Topics Concern  ? Not on file  ?Social History Narrative  ? Lives with daughter, [name], and her grandchildren.  ? Right-handed.  ? Three cups caffeine daily.  ? ?Social Determinants of Health  ? ?Financial Resource Strain: Not on file  ?Food Insecurity: Not on file  ?Transportation Needs: Not on file  ?Physical Activity: Not on file  ?Stress: Not on file  ?Social Connections: Not on file  ?Intimate Partner Violence: Not on file  ?  ? ?Physical Exam  ? ?Vitals:   ? 02/19/22 0640  ?BP: (!) 153/77  ?Pulse: 81  ?Resp: 20  ?Temp: 98.6 ?F (37 ?C)  ?SpO2: 99%  ?  ?CONSTITUTIONAL: Well-appearing, NAD ?NEURO/PSYCH:  Alert and oriented x 3, no focal deficits ?EYES:  eyes equal and reactive ?ENT/NECK:  no LAD, no JVD ?CARDIO: Regular rate, well-perfused, normal S1 and S2 ?PULM:  CTAB no wheezing or rhonchi ?GI/GU:  non-distended, non-tender ?MSK/SPINE:  No gross deformities, no edema ?SKIN:  no rash, atraumatic ? ? ?*Additional and/or pertinent findings included in MDM below ? ?Diagnostic and Interventional Summary  ? ? EKG Interpretation ? ?Date/Time:  Friday February 19 2022 06:41:51 EDT ?Ventricular Rate:  86 ?PR Interval:  157 ?QRS Duration: 93 ?QT Interval:  383 ?QTC Calculation: 459 ?R Axis:   82 ?Text Interpretation: Sinus arrhythmia Ventricular premature complex Inferior infarct, acute (RCA) Probable RV involvement, suggest recording right precordial leads >>> Acute MI <<< Confirmed by Kennis Carina [phone]) on 02/19/2022 6:56:35 AM ?  ? ?  ? ?Labs Reviewed  ?RESP PANEL BY RT-PCR (FLU A&B, COVID) ARPGX2  ?HEMOGLOBIN A1C  ?CBC WITH DIFFERENTIAL/PLATELET  ?PROTIME-INR  ?APTT  ?COMPREHENSIVE METABOLIC PANEL  ?LIPID PANEL  ?TROPONIN I (HIGH SENSITIVITY)  ?  ?DG Chest Port 1  View    (Results Pending)  ?  ?Medications  ?0.9 %  sodium chloride infusion (has no administration in time range)  ?aspirin chewable tablet 324 mg (has no administration in time range)  ?heparin 5000 UNIT/ML injection (has no administration in time range)  ?heparin injection 4,000 Units (4,000 Units Intravenous Given 02/19/22 0657)  ?  ? ?Procedures  /  Critical Care ?.Critical Care ?Performed by: Sabas Sous, MD ?Authorized by: Sabas Sous, MD  ? ?Critical care provider statement:  ?  Critical care time (minutes):  35 ?  Critical care was necessary to treat or prevent imminent or life-threatening deterioration of the following conditions: ST elevation myocardial infarction. ?  Critical care was time spent  personally by me on the following activities:  Development of treatment plan with patient or surrogate, discussions with consultants, evaluation of patient's response to treatment, examination of patient, ordering and review of laboratory studies, ordering and review of radiographic studies, ordering and performing treatments and interventions, pulse oximetry, re-evaluation of patient's condition and review of old charts ? ?ED Course and Medical Decision Making  ?Initial Impression and Ddx ?Chest pain, twelve-lead done by EMS in route with some ST elevation inferiorly but not quite meeting criteria.  EMS repeated the twelve-lead as they were coming into the building and it was much more concerning and so they brought it to my attention immediately.  Code STEMI initiated.  Elevation inferiorly with reciprocal depression in aVL, meeting criteria.  Spoke with Dr. Priscella Mann who agrees, will go emergently to cath.  Hemodynamically doing well, mild ectopy, frequent PVCs noted. ? ?Past medical/surgical history that increases complexity of ED encounter: CAD, CVA ? ?Interpretation of Diagnostics ?I personally reviewed the EKG and my interpretation is as follows: STEMI ?   ? ? ?Patient Reassessment and Ultimate Disposition/Management ?Plan is for emergent PCI. ? ?Patient management required discussion with the following services or consulting groups:  Cardiology ? ?Complexity of Problems Addressed ?Acute illness or injury that poses threat of life of bodily function ? ?Additional Data Reviewed and Analyzed ?Further history obtained from: ?Further history from spouse/family member ? ?Additional Factors Impacting ED Encounter Risk ?Consideration of hospitalization ? ?Elmer Sow. Pilar Plate, MD ?[facility] Emergency Medicine ?[facility] [email or web address] ? ?Final Clinical Impressions(s) / ED Diagnoses  ? ?  ICD-10-CM   ?1. ST elevation myocardial infarction (STEMI), unspecified artery (HCC)  I21.3   ?  ?  ?ED  Discharge Orders   ? ? None  ? ?  ?  ? ?Discharge Instructions Discussed with and Provided to Patient:  ? ?Discharge Instructions   ?None ?  ? ?  ?Sabas Sous, MD ?02/19/22 [phone] ? ?

## 2022-02-19 NOTE — Progress Notes (Signed)
?  Echocardiogram ?2D Echocardiogram has been performed. ? ?[name] ?02/19/2022, 9:33 AM ?

## 2022-02-19 NOTE — H&P (Signed)
[name] is an 76 y.o. female.   ?Chief Complaint: Chest pain ?HPI:  ? ?76 y.o. African American female  with hypertension, type 2 DM, h/o stroke 2015 w/subdural hematoma, h/o seizures, admitted with chest pain. ? ?Pain is retrosternal, stated 5 AM. EKG showed inferior STE. STEMI was called in the ED. ? ?Past Medical History:  ?Diagnosis Date  ? Diabetes mellitus   ? H/O: CVA (cerebrovascular accident) 07/08/2014  ? Hypertension   ? Stroke [facility])   ? ? ?Past Surgical History:  ?Procedure Laterality Date  ? NO PAST SURGERIES    ? ? ? ?Family History  ?Problem Relation Age of Onset  ? Hyperlipidemia Mother   ? Hypertension Mother   ? Diabetes Mother   ? Stroke Father   ? ? ?Social History:  reports that she has been smoking cigarettes. She has a 48.00 pack-year smoking history. She has never used smokeless tobacco. She reports that she does not drink alcohol and does not use drugs. ? ?Allergies:  ?Allergies  ?Allergen Reactions  ? Penicillins Itching  ? Sulfa Antibiotics Other (See Comments)  ?  Reaction not recalled, but patient was told she was allergic  ? ? ?Review of Systems  ?Cardiovascular:  Positive for chest pain. Negative for dyspnea on exertion, leg swelling, palpitations and syncope.  ? ? ?Blood pressure (!) 153/77, pulse 81, temperature 98.6 ?F (37 ?C), temperature source Oral, resp. rate 20, height 5\' 3"  (1.6 m), weight 79.4 kg, last menstrual period 09/28/2014, SpO2 99 %. Body mass index is 31 kg/m?. ? ?Physical Exam ?Vitals and nursing note reviewed.  ?Constitutional:   ?   General: She is not in acute distress. ?Neck:  ?   Vascular: No JVD.  ?Cardiovascular:  ?   Rate and Rhythm: Normal rate and regular rhythm.  ?   Heart sounds: Normal heart sounds. No murmur heard. ?Pulmonary:  ?   Effort: Pulmonary effort is normal.  ?   Breath sounds: Normal breath sounds. No wheezing or rales.  ?Neurological:  ?   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 ? ? ?Medications Prior to Admission   ?Medication Sig Dispense Refill  ? acetaminophen (TYLENOL) 325 MG tablet Take 650 mg by mouth every 6 (six) hours as needed for pain.    ? aspirin 81 MG chewable tablet Chew 81 mg by mouth in the morning.    ? atenolol (TENORMIN) 25 MG tablet Take 1 tablet (25 mg total) by mouth daily. 90 tablet 1  ? atorvastatin (LIPITOR) 20 MG tablet Take 1 tablet (20 mg total) by mouth daily. 90 tablet 1  ? Blood Glucose Monitoring Suppl (ACCU-CHEK AVIVA) device Use as instructed daily. 1 each 0  ? clopidogrel (PLAVIX) 75 MG tablet Take 1 tablet by mouth daily. 90 tablet 0  ? diclofenac Sodium (VOLTAREN) 1 % GEL Apply 4 g topically 4 (four) times daily as needed (arthritis pain).    ? folic acid (FOLVITE) 1 MG tablet Take 1 tablet (1 mg total) by mouth daily. 30 tablet 0  ? glucose blood (TRUE METRIX BLOOD GLUCOSE TEST) test strip Use as instructed 100 each 6  ? Lacosamide 100 MG TABS Take 1 tablet (100 mg total) by mouth in the morning and at bedtime. 180 tablet 4  ? Lacosamide 100 MG TABS Take 1 tablet by mouth in the morning and at bedtime 180 tablet 4  ? Lancets (ACCU-CHEK MULTICLIX) lancets Use as instructed 100 each 12  ? lisinopril (ZESTRIL) 10  MG tablet TAKE 1 TABLET (10 MG TOTAL) BY MOUTH DAILY. 90 tablet 3  ? metFORMIN (GLUCOPHAGE) 500 MG tablet TAKE 2 TABLETS (1,000 MG TOTAL) BY MOUTH 2 (TWO) TIMES DAILY WITH A MEAL. (Patient taking differently: Take 1,000 mg by mouth 2 (two) times daily with a meal.) 360 tablet 0  ? traMADol (ULTRAM) 50 MG tablet Take 1 tablet (50 mg total) by mouth at bedtime as needed. For chronic bilateral osteoarthritis (Patient taking differently: Take 50 mg by mouth at bedtime as needed (chronic bilateral osteoarthritis).) 30 tablet 1  ? vitamin B-12 (CYANOCOBALAMIN) 1000 MCG tablet Take 1 tablet (1,000 mcg total) by mouth daily. 30 tablet 0  ? ? ? ? ?Current Facility-Administered Medications:  ?  0.9 %  sodium chloride infusion, , Intravenous, Continuous, Sabas Sous, MD ?  Mitzi Hansen Hold]  aspirin chewable tablet 324 mg, 324 mg, Oral, Once, Sabas Sous, MD ?  heparin 5000 UNIT/ML injection, , , ,  ? ? ?Today's Vitals  ? 02/19/22 0640 02/19/22 0656  ?BP: (!) 153/77   ?Pulse: 81   ?Resp: 20   ?Temp: 98.6 ?F (37 ?C)   ?TempSrc: Oral   ?SpO2: 99%   ?Weight:  79.4 kg  ?Height:  5\' 3"  (1.6 m)  ? ?Body mass index is 31 kg/m?. ? ? ? ? ?Lab Results: ?Pending ? ? ? ?Tests ordered: ? ?Lab Orders    ?     Resp Panel by RT-PCR (Flu A&B, Covid) Nasopharyngeal Swab    ?     Hemoglobin A1c    ?     CBC with Differential/Platelet    ?     Protime-INR    ?     APTT    ?     Comprehensive metabolic panel    ?     Lipid panel    ? ? ? ?Cardiac Studies: ? ?EKG 02/19/2022: ?Sinus rhythm ?Acute inferior injury ? ?Assessment & Recommendations: ? ?76 y.o. African American female  with hypertension, type 2 DM, h/o stroke 2015 w/subdural hematoma, h/o seizures, admitted with chest pain. ? ?STEMI: ?Inferior STEMI. Ongoing chest pain and STE. ?In spite of old subdural hemorrhage, benefits outweighs risks at this time to proceed with emergent primary PCI ? ?Explained to patient and daughter.  ? ? ? ?2016, MD ?Pager: [phone] ?Office: [phone] ? ? ?  ? ?

## 2022-02-19 NOTE — Care Management (Signed)
?  Transition of Care (TOC) Screening Note ? ? ?Patient Details  ?Name: [name] ?Date of Birth: 09/30/1946 ? ? ?Transition of Care (TOC) CM/SW Contact:    ?Graves-Bigelow, Lamar Laundry, RN ?Phone Number: ?02/19/2022, 2:07 PM ? ? ? ?Transition of Care Department [facility]) has reviewed the patient and no TOC needs have been identified at this time. Case Manager will continue to follow for additional transition of care needs.  ?

## 2022-02-19 NOTE — ED Notes (Addendum)
Cardiology at bedside - consent signed  ?

## 2022-02-20 LAB — CBC
HCT: 43 % (ref 36.0–46.0)
Hemoglobin: 13.6 g/dL (ref 12.0–15.0)
MCH: 26.4 pg (ref 26.0–34.0)
MCHC: 31.6 g/dL (ref 30.0–36.0)
MCV: 83.5 fL (ref 80.0–100.0)
Platelets: 280 10*3/uL (ref 150–400)
RBC: 5.15 MIL/uL — ABNORMAL HIGH (ref 3.87–5.11)
RDW: 14.9 % (ref 11.5–15.5)
WBC: 11.1 10*3/uL — ABNORMAL HIGH (ref 4.0–10.5)
nRBC: 0 % (ref 0.0–0.2)

## 2022-02-20 LAB — BASIC METABOLIC PANEL
Anion gap: 10 (ref 5–15)
BUN: 9 mg/dL (ref 8–23)
CO2: 24 mmol/L (ref 22–32)
Calcium: 8.5 mg/dL — ABNORMAL LOW (ref 8.9–10.3)
Chloride: 106 mmol/L (ref 98–111)
Creatinine, Ser: 0.92 mg/dL (ref 0.44–1.00)
GFR, Estimated: >60 mL/min (ref >60)
Glucose, Bld: 166 mg/dL — ABNORMAL HIGH (ref 70–99)
Potassium: 4 mmol/L (ref 3.5–5.1)
Sodium: 140 mmol/L (ref 135–145)

## 2022-02-20 LAB — GLUCOSE, CAPILLARY
Glucose-Capillary: 211 mg/dL — ABNORMAL HIGH (ref 70–99)
Glucose-Capillary: 159 mg/dL — ABNORMAL HIGH (ref 70–99)

## 2022-02-20 MED ORDER — ATORVASTATIN CALCIUM 40 MG PO TABS
40.0000 mg | ORAL_TABLET | Freq: Every day | ORAL | 1 refills | Status: DC
  Filled 2022-02-20 – 2022-03-02 (×2): qty 90, 90d supply, fill #0
  Filled 2022-06-01: qty 90, 90d supply, fill #1

## 2022-02-20 NOTE — Progress Notes (Signed)
Pt d/c to home with daughter. AVS given and explained. Pt nor family had any questions at this time. SWOT nurse taking patient to vehicle.  ?

## 2022-02-20 NOTE — Progress Notes (Signed)
CARDIAC REHAB PHASE I  ? ?PRE:  Rate/Rhythm: 72 SR ? ?BP:  Sitting: 100/76     ? ?SaO2: 97 RA ? ?MODE:  Ambulation: 470 ft  ? ?POST:  Rate/Rhythm: 105 ST ? ?BP:  Sitting: 109/86 ? ?  SaO2: 99 RA ? ? ?Pt ambulated 441ft in hallway standby assist with steady gait. Pt denies CP, SOB, or dizziness throughout. Pt returned to bed. Pt educated on importance of ASA and Plavix. Pt given MI book along with heart healthy and diabetic diets. Reviewed site care, restrictions, and exercise guidelines. Will refer to CRP II [place]. ? ?[phone] ?Reynold Bowen, RN BSN ?02/20/2022 ?10:34 AM ? ?

## 2022-02-20 NOTE — Discharge Summary (Signed)
Physician Discharge Summary  ?Patient ID: ?[name] ?MRN: [id number] ?DOB/AGE: Sep 22, 1946 76 y.o. ? ?Admit date: 02/19/2022 ?Discharge date: 02/20/2022 ? ?Primary Discharge Diagnosis: ?STEMI ? ?Secondary Discharge Diagnosis: ?Hypertension ?Dementia ?H/o stroke ?Type 2 DM ? ? ?Hospital Course:  ? ?76 y.o. African American female  with hypertension, type 2 DM, h/o stroke 2015 w/subdural hematoma, h/o seizures, dementia, admitted with chest pain, inferior STEMI ? ?She udnerwent primary PCI to 95% mid Lcx stenosis. She ambulated next morning without complaints.  ? ?Did the patient have an acute coronary syndrome (MI, NSTEMI, STEMI, etc) this admission?:  Yes                              ? ?AHA/ACC Clinical Performance & Quality Measures: ?Aspirin prescribed? - Yes ?ADP Receptor Inhibitor (Plavix/Clopidogrel, Brilinta/Ticagrelor or Effient/Prasugrel) prescribed (includes medically managed patients)? - Yes ?Beta Blocker prescribed? - Yes ?High Intensity Statin (Lipitor 40-80mg  or Crestor 20-40mg ) prescribed? - Yes ?EF assessed during THIS hospitalization? - Yes ?For EF <40%, was ACEI/ARB prescribed? - Not Applicable (EF >/= 40%) ?For EF <40%, Aldosterone Antagonist (Spironolactone or Eplerenone) prescribed? - Not Applicable (EF >/= 98%) ?Cardiac Rehab Phase II ordered (including medically managed patients)? - Yes ? ? ?Discharge Exam: ?Blood pressure 93/68, pulse 80, temperature 98.2 ?F (36.8 ?C), temperature source Oral, resp. rate 18, height 5\' 3"  (1.6 m), weight 80.8 kg, last menstrual period 09/28/2014, SpO2 96 %.  ? ?Physical Exam ?Vitals and nursing note reviewed.  ?Constitutional:   ?   General: She is not in acute distress. ?Neck:  ?   Vascular: No JVD.  ?Cardiovascular:  ?   Rate and Rhythm: Normal rate and regular rhythm.  ?   Heart sounds: Normal heart sounds. No murmur heard. ?Pulmonary:  ?   Effort: Pulmonary effort is normal.  ?   Breath sounds: Normal breath sounds. No wheezing or rales.   ?Neurological:  ?   Comments: Left hemiparesis  ? ? ? ? ? ?Significant Diagnostic Studies: ? ?EKG 02/20/2022: ?Sinus rhythm ?Inferolateral infarct, age indeterminate ?Expeted evolution ? ?Echocardiogram 02/19/2021: ? 1. Left ventricular ejection fraction, by estimation, is 55 to 60%. The  ?left ventricle has normal function. The left ventricle has no regional  ?wall motion abnormalities. There is moderate left ventricular hypertrophy  ?of the basal-septal segment. Left  ?ventricular diastolic parameters are consistent with Grade I diastolic  ?dysfunction (impaired relaxation).  ? 2. Right ventricular systolic function is normal. The right ventricular  ?size is normal.  ? 3. The mitral valve is abnormal. Mild to moderate mitral valve  ?regurgitation. Severe mitral annular calcification.  ? 4. The aortic valve is tricuspid. Aortic valve regurgitation is not  ?visualized.  ? ?Comparison(s): A prior study was performed on 03/13/2021. Which showed  ?trivial mitral regurgitation. no other significant change noted.  ? ?Coronary intervention 02/19/2022: ?LM: Normal ?LAD: Minimal luminal irregularities ?Lcx: Prox 50% disease (non-culprit) ?       Prox-mid 95% stenosis (culprit) ?RCA: Prox 20% disease ?  ?LVEDP 27 mmHg ?  ?Successful percutaneous coronary intervention mid LCx ?       PTCA and stent placement 3.0 X 20 mm Synergy drug-eluting stent ?       Post dilatation with 3.25X15 mm [place] balloon at 16 atm ?95%-->0% stenosis at culprit lesion ?TIMI III flow ? ? ?FOLLOW UP PLANS AND APPOINTMENTS ?Discharge Instructions   ? ? Amb Referral to Cardiac Rehabilitation   Complete  by: As directed ?  ? Diagnosis:  Coronary Stents ?STEMI  ?  ? After initial evaluation and assessments completed: Virtual Based Care may be provided alone or in conjunction with Phase 2 Cardiac Rehab based on patient barriers.: Yes  ? Diet - low sodium heart healthy   Complete by: As directed ?  ? Increase activity slowly   Complete by: As directed ?  ? No  dressing needed   Complete by: As directed ?  ? ?  ? ?Allergies as of 02/20/2022   ? ?   Reactions  ? Penicillins Itching  ? Sulfa Antibiotics Other (See Comments)  ? Reaction not recalled, but patient was told she was allergic  ? ?  ? ?  ?Medication List  ?  ? ?TAKE these medications   ? ?Accu-Chek Aviva device ?Use as instructed daily. ?  ?accu-chek multiclix lancets ?Use as instructed ?  ?acetaminophen 325 MG tablet ?Commonly known as: TYLENOL ?Take 650 mg by mouth every 6 (six) hours as needed for pain or moderate pain. ?  ?aspirin 81 MG chewable tablet ?Chew 81 mg by mouth in the morning. ?  ?atenolol 25 MG tablet ?Commonly known as: TENORMIN ?Take 1 tablet (25 mg total) by mouth daily. ?  ?atorvastatin 40 MG tablet ?Commonly known as: LIPITOR ?Take 1 tablet (40 mg total) by mouth daily. ?What changed:  ?medication strength ?how much to take ?  ?clopidogrel 75 MG tablet ?Commonly known as: PLAVIX ?Take 1 tablet by mouth daily. ?What changed: how much to take ?  ?diclofenac Sodium 1 % Gel ?Commonly known as: Voltaren ?Apply 4 g topically 4 (four) times daily as needed (arthritis pain). ?  ?folic acid 1 MG tablet ?Commonly known as: FOLVITE ?Take 1 tablet (1 mg total) by mouth daily. ?  ?Lacosamide 100 MG Tabs ?Take 1 tablet by mouth in the morning and at bedtime ?What changed:  ?how much to take ?how to take this ?when to take this ?  ?lisinopril 10 MG tablet ?Commonly known as: ZESTRIL ?TAKE 1 TABLET (10 MG TOTAL) BY MOUTH DAILY. ?What changed: how much to take ?  ?metFORMIN 500 MG tablet ?Commonly known as: GLUCOPHAGE ?TAKE 2 TABLETS (1,000 MG TOTAL) BY MOUTH 2 (TWO) TIMES DAILY WITH A MEAL. ?What changed: how much to take ?Notes to patient: Restart this medication on 02/21/2022 ?  ?traMADol 50 MG tablet ?Commonly known as: ULTRAM ?Take 1 tablet (50 mg total) by mouth at bedtime as needed. For chronic bilateral osteoarthritis ?What changed:  ?reasons to take this ?additional instructions ?  ?True Metrix Blood  Glucose Test test strip ?Generic drug: glucose blood ?Use as instructed ?  ?vitamin B-12 1000 MCG tablet ?Commonly known as: CYANOCOBALAMIN ?Take 1 tablet (1,000 mcg total) by mouth daily. ?  ? ?  ? ?  ?  ? ? ?  ?Discharge Care Instructions  ?(From admission, onward)  ?  ? ? ?  ? ?  Start     Ordered  ? 02/19/22 0000  No dressing needed       ? 02/19/22 1115  ? ?  ?  ? ?  ? ? ?F/u w/Celeste Cantwell, PA on 03/05/2022 at 9:30 AM ? ? ? ?Nigel Mormon, MD ?Pager: [phone] ?Office: [phone] ? ? ?

## 2022-02-22 ENCOUNTER — Telehealth: Payer: Self-pay

## 2022-02-22 ENCOUNTER — Other Ambulatory Visit: Payer: Self-pay

## 2022-02-22 NOTE — Telephone Encounter (Signed)
Location of hospitalization: [place] ?Reason for hospitalization: Chest pain ?Date of discharge: 02/20/2022 ?Date of first communication with patient: today ?Person contacting patient: Gaye Alken, [place] ?Current symptoms: none ?Do you understand why you were in the Hospital: Yes ?Questions regarding discharge instructions: None ?Where were you discharged to: Home ?Medications reviewed: Yes ?Allergies reviewed: Yes ?Dietary changes reviewed: Yes. Discussed low fat and low salt diet.  ?Referals reviewed: NA ?Activities of Daily Living: Able to with mild limitations ?Any transportation issues/concerns: None ?Any patient concerns: None ?Confirmed importance & date/time of Follow up appt: Yes ?Confirmed with patient if condition begins to worsen call. Pt was given the office number and encouraged to call back with questions or concerns: Yes  ? ?

## 2022-02-22 NOTE — Telephone Encounter (Signed)
Transition Care Management Follow-up Telephone Call ? ?Call completed with patient's daughter, Berdie Ogren on file to speak with her.  ?Date of discharge and from where: 02/20/2022, [facility]  ?How have you been since you were released from the hospital? She said her mother is doing just fine, back to her normal routine.  ?Any questions or concerns? No ? ?Items Reviewed: ?Did the pt receive and understand the discharge instructions provided? Yes  ?Medications obtained and verified? Yes she has all medications except for the new prescription for atorvastatin which she plans to pick up today and she did not have any questions about the med regime.   [name] manages her medications.  ?Other? No  ?Any new allergies since your discharge? No  ?Dietary orders reviewed? No ?Do you have support at home? Yes - she lives with [name].  ? ?Home Care and Equipment/Supplies: ?Were home health services ordered? no ?If so, what is the name of the agency? N/a  ?Has the agency set up a time to come to the patient's home? not applicable ?Were any new equipment or medical supplies ordered?  No ?What is the name of the medical supply agency? N/a ?Were you able to get the supplies/equipment? not applicable ?Do you have any questions related to the use of the equipment or supplies? No ? ?Functional Questionnaire: (I = Independent and D = Dependent) ?ADLs: [name] stated that she is independent with dressing, bathing, some meal preparation.  [name] is there to assist if needed. Her mother has a quad cane to use when necessary.  ? ? ?Follow up appointments reviewed: ? ?PCP Hospital f/u appt confirmed? Yes  Scheduled to see Dr Alvis Lemmings - 03/08/2022.  ?Specialist Hospital f/u appt confirmed? Yes  Scheduled to see cardiology  - 03/05/2022, Neurology - 03/13/2022.  ?Are transportation arrangements needed? No  ?If their condition worsens, is the pt aware to call PCP or go to the Emergency Dept.? Yes ?Was the patient provided with contact  information for the PCP's office or ED? Yes ?Was to pt encouraged to call back with questions or concerns? Yes ? ?

## 2022-02-22 NOTE — Telephone Encounter (Signed)
From the discharge call: ? ?Information obtained from her daughter, Hassan Rowan.  ? ? She said her mother is doing just fine, back to her normal routine.  ? ?she has all medications except for the new prescription for atorvastatin which she plans to pick up today and she did not have any questions about the med regime.   Hassan Rowan manages her medications.  ? ?Hassan Rowan stated that she is independent with dressing, bathing, some meal preparation.  Hassan Rowan is there to assist if needed. Her mother has a quad cane to use when necessary.  ? ?Scheduled to see Dr Margarita Rana - 03/08/2022.  ? ?

## 2022-02-24 ENCOUNTER — Telehealth (HOSPITAL_COMMUNITY): Payer: Self-pay

## 2022-02-24 NOTE — Telephone Encounter (Signed)
Pt insurance is active and benefits verified through Medicare a/b Co-pay 0, DED $226/$54.27 met, out of pocket 0/0 met, co-insurance 20%. no pre-authorization required. Passport, 02/24/2022_0 :58am, REF# [id number] ?  ?Will contact patient to see if she is interested in the Cardiac Rehab Program. If interested, patient will need to complete follow up appt. Once completed, patient will be contacted for scheduling upon review by the RN Navigator. ?

## 2022-03-01 ENCOUNTER — Other Ambulatory Visit: Payer: Self-pay

## 2022-03-02 ENCOUNTER — Other Ambulatory Visit: Payer: Self-pay

## 2022-03-03 ENCOUNTER — Other Ambulatory Visit: Payer: Self-pay

## 2022-03-04 NOTE — Progress Notes (Signed)
? ?Primary Physician/Referring:  Hoy Register, MD ? ?Patient ID: [name], female    DOB: 11/02/1946, 76 y.o.   MRN: [id number] ? ?Chief Complaint  ?Patient presents with  ? ST elevation myocardial infarction   ? New Patient (Initial Visit)  ? ?HPI:   ? ?[name]  is a 76 y.o. 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 She udnerwent primary PCI to 95% mid Lcx stenosis.  ? ?Patient now presents for follow up.  Patient is accompanied by her daughter at today's office visit who assists in providing history given the patient has difficulty speaking since stroke in 2015.  Overall patient is feeling well without specific complaints today.  She has had no recurrence of chest pain.  Denies dyspnea, orthopnea, PND.  She is essentially returned to baseline activity at home without issue. ? ?Past Medical History:  ?Diagnosis Date  ? Diabetes mellitus   ? H/O: CVA (cerebrovascular accident) 07/08/2014  ? Hypertension   ? Stroke [facility])   ? ?Past Surgical History:  ?Procedure Laterality Date  ? CORONARY STENT INTERVENTION N/A 02/19/2022  ? Procedure: CORONARY STENT INTERVENTION;  Surgeon: Elder Negus, MD;  Location: [facility] INVASIVE CV LAB;  Service: Cardiovascular;  Laterality: N/A;  ? CORONARY/GRAFT ACUTE MI REVASCULARIZATION N/A 02/19/2022  ? Procedure: Coronary/Graft Acute MI Revascularization;  Surgeon: Elder Negus, MD;  Location: [facility] INVASIVE CV LAB;  Service: Cardiovascular;  Laterality: N/A;  ? LEFT HEART CATH AND CORONARY ANGIOGRAPHY N/A 02/19/2022  ? Procedure: LEFT HEART CATH AND CORONARY ANGIOGRAPHY;  Surgeon: Elder Negus, MD;  Location: [facility] INVASIVE CV LAB;  Service: Cardiovascular;  Laterality: N/A;  ? NO PAST SURGERIES    ? ?Family History  ?Problem Relation Age of Onset  ? Hyperlipidemia Mother   ? Hypertension Mother   ? Diabetes Mother   ?  Stroke Father   ?  ?Social History  ? ?Tobacco Use  ? Smoking status: Former  ?  Packs/day: 1.00  ?  Years: 48.00  ?  Pack years: 48.00  ?  Types: Cigarettes  ?  Quit date: 2023  ?  Years since quitting: 0.2  ? Smokeless tobacco: Never  ?Substance Use Topics  ? Alcohol use: No  ? ?Marital Status: Widowed  ? ?ROS  ?Review of Systems  ?Constitutional: Negative for malaise/fatigue and weight gain.  ?Cardiovascular:  Negative for chest pain, claudication, dyspnea on exertion, leg swelling, near-syncope, orthopnea, palpitations, paroxysmal nocturnal dyspnea and syncope.  ?Neurological:  Negative for dizziness.  ? ?Objective  ?Blood pressure 124/72, pulse 75, resp. rate 16, height 5\' 3"  (1.6 m), weight 184 lb (83.5 kg), last menstrual period 09/28/2014, SpO2 96 %.  ? ?  03/05/2022  ?  9:37 AM 02/20/2022  ?  9:05 AM 02/20/2022  ?  8:00 AM  ?Vitals with BMI  ?Height 5\' 3"     ?Weight 184 lbs    ?BMI 32.6    ?Systolic 124 93 122  ?Diastolic 72 68 107  ?Pulse 75  80  ?  ? Physical Exam ?Vitals reviewed.  ?Constitutional:   ?   Appearance: She is obese.  ?Cardiovascular:  ?   Rate and Rhythm: Normal rate and regular rhythm.  ?   Pulses: Intact distal pulses.  ?   Heart sounds: S1 normal and S2 normal. No murmur heard. ?  No gallop.  ?   Comments: Radial access  site well-healed without ecchymosis, hematoma, bruit. ?Pulmonary:  ?   Effort: Pulmonary effort is normal. No respiratory distress.  ?   Breath sounds: No wheezing, rhonchi or rales.  ?Musculoskeletal:  ?   Right lower leg: No edema.  ?   Left lower leg: No edema.  ?Neurological:  ?   Mental Status: She is alert.  ?   Comments: Left hemiparesis   ? ?Laboratory examination:  ? ?Recent Labs  ?  11/06/21 ?0222 02/19/22 ?0654 02/19/22 ?0755 02/20/22 ?0241  ?NA 140 137 136 140  ?K 3.9 4.1 3.8 4.0  ?CL 107 105 100 106  ?CO2 26 24  --  24  ?GLUCOSE 119* 175* 146* 166*  ?BUN 14 9 9 9   ?CREATININE 1.06* 1.06* 0.70 0.92  ?CALCIUM 8.6* 8.7*  --  8.5*  ?GFRNONAA 55* 55*  --  >60   ? ?estimated creatinine clearance is 54.9 mL/min (by C-G formula based on SCr of 0.92 mg/dL).  ? ?  Latest Ref Rng & Units 02/20/2022  ?  2:41 AM 02/19/2022  ?  7:55 AM 02/19/2022  ?  6:54 AM  ?CMP  ?Glucose 70 - 99 mg/dL 02/21/2022   677   373    ?BUN 8 - 23 mg/dL 9   9   9     ?Creatinine 0.44 - 1.00 mg/dL 668     1.59    ?Sodium 135 - 145 mmol/L 140   136   137    ?Potassium 3.5 - 5.1 mmol/L 4.0   3.8   4.1    ?Chloride 98 - 111 mmol/L 106   100   105    ?CO2 22 - 32 mmol/L 24    24    ?Calcium 8.9 - 10.3 mg/dL 8.5    8.7    ?Total Protein 6.5 - 8.1 g/dL   6.1    ?Total Bilirubin 0.3 - 1.2 mg/dL   0.8    ?Alkaline Phos 38 - 126 U/L   64    ?AST 15 - 41 U/L   25    ?ALT 0 - 44 U/L   14    ? ? ?  Latest Ref Rng & Units 02/20/2022  ?  2:41 AM 02/19/2022  ?  7:55 AM 02/19/2022  ?  6:54 AM  ?CBC  ?WBC 4.0 - 10.5 K/uL 11.1    9.5    ?Hemoglobin 12.0 - 15.0 g/dL 02/21/2022   02/21/2022   51.8    ?Hematocrit 36.0 - 46.0 % 43.0   35.0   39.8    ?Platelets 150 - 400 K/uL 280    260    ? ? ?Lipid Panel ?Recent Labs  ?  03/13/21 ?0456 11/05/21 ?0253 02/19/22 ?0654  ?CHOL 132 110 165  ?TRIG 105 103 148  ?LDLCALC 70 50 84  ?VLDL 21 21 30   ?HDL 41 39* 51  ?CHOLHDL 3.2 2.8 3.2  ? ? ?HEMOGLOBIN A1C ?Lab Results  ?Component Value Date  ? HGBA1C 7.2 (H) 02/19/2022  ? MPG 159.94 02/19/2022  ? ?TSH ?Recent Labs  ?  03/12/21 ?1718 11/05/21 ?1324  ?TSH 0.785 1.497  ? ? ?External labs:  ?None  ? ?Allergies  ? ?Allergies  ?Allergen Reactions  ? Penicillins Itching  ? Sulfa Antibiotics Other (See Comments)  ?  Reaction not recalled, but patient was told she was allergic  ?  ?Medications Prior to Visit:  ? ?Outpatient Medications Prior to Visit  ?Medication Sig Dispense Refill  ?  acetaminophen (TYLENOL) 325 MG tablet Take 650 mg by mouth every 6 (six) hours as needed for pain or moderate pain.    ? aspirin 81 MG chewable tablet Chew 81 mg by mouth in the morning.    ? atenolol (TENORMIN) 25 MG tablet Take 1 tablet (25 mg total) by mouth daily. 90 tablet 1  ?  atorvastatin (LIPITOR) 40 MG tablet Take 1 tablet (40 mg total) by mouth daily. 90 tablet 1  ? Blood Glucose Monitoring Suppl (ACCU-CHEK AVIVA) device Use as instructed daily. 1 each 0  ? clopidogrel (PLAVIX) 75 MG tablet Take 1 tablet by mouth daily. (Patient taking differently: Take 75 mg by mouth daily.) 90 tablet 0  ? diclofenac Sodium (VOLTAREN) 1 % GEL Apply 4 g topically 4 (four) times daily as needed (arthritis pain).    ? folic acid (FOLVITE) 1 MG tablet Take 1 tablet (1 mg total) by mouth daily. 30 tablet 0  ? glucose blood (TRUE METRIX BLOOD GLUCOSE TEST) test strip Use as instructed 100 each 6  ? Lacosamide 100 MG TABS Take 1 tablet by mouth in the morning and at bedtime (Patient taking differently: Take 100 mg by mouth in the morning and at bedtime.) 180 tablet 4  ? Lancets (ACCU-CHEK MULTICLIX) lancets Use as instructed 100 each 12  ? lisinopril (ZESTRIL) 10 MG tablet TAKE 1 TABLET (10 MG TOTAL) BY MOUTH DAILY. (Patient taking differently: Take 10 mg by mouth daily.) 90 tablet 3  ? metFORMIN (GLUCOPHAGE) 500 MG tablet TAKE 2 TABLETS (1,000 MG TOTAL) BY MOUTH 2 (TWO) TIMES DAILY WITH A MEAL. (Patient taking differently: Take 1,000 mg by mouth 2 (two) times daily with a meal.) 360 tablet 0  ? traMADol (ULTRAM) 50 MG tablet Take 1 tablet (50 mg total) by mouth at bedtime as needed. For chronic bilateral osteoarthritis (Patient taking differently: Take 50 mg by mouth at bedtime as needed for severe pain (chronic bilateral osteoarthritis).) 30 tablet 1  ? vitamin B-12 (CYANOCOBALAMIN) 1000 MCG tablet Take 1 tablet (1,000 mcg total) by mouth daily. 30 tablet 0  ? ?No facility-administered medications prior to visit.  ? ?Final Medications at End of Visit   ? ?Current Meds  ?Medication Sig  ? acetaminophen (TYLENOL) 325 MG tablet Take 650 mg by mouth every 6 (six) hours as needed for pain or moderate pain.  ? aspirin 81 MG chewable tablet Chew 81 mg by mouth in the morning.  ? atenolol (TENORMIN) 25 MG tablet  Take 1 tablet (25 mg total) by mouth daily.  ? atorvastatin (LIPITOR) 40 MG tablet Take 1 tablet (40 mg total) by mouth daily.  ? Blood Glucose Monitoring Suppl (ACCU-CHEK AVIVA) device Use as instructed daily.  ? c

## 2022-03-05 ENCOUNTER — Encounter: Payer: Self-pay | Admitting: Student

## 2022-03-05 ENCOUNTER — Ambulatory Visit: Payer: Medicare Other | Admitting: Student

## 2022-03-05 VITALS — BP 124/72 | HR 75 | Resp 16 | Ht 63.0 in | Wt 184.0 lb

## 2022-03-05 DIAGNOSIS — I252 Old myocardial infarction: Secondary | ICD-10-CM

## 2022-03-05 DIAGNOSIS — I10 Essential (primary) hypertension: Secondary | ICD-10-CM

## 2022-03-05 DIAGNOSIS — I2510 Atherosclerotic heart disease of native coronary artery without angina pectoris: Secondary | ICD-10-CM

## 2022-03-08 ENCOUNTER — Other Ambulatory Visit: Payer: Self-pay

## 2022-03-08 ENCOUNTER — Ambulatory Visit: Payer: Medicare Other | Admitting: Family Medicine

## 2022-03-09 ENCOUNTER — Other Ambulatory Visit: Payer: Self-pay

## 2022-03-09 ENCOUNTER — Telehealth: Payer: Self-pay

## 2022-03-09 MED ORDER — NITROGLYCERIN 0.4 MG SL SUBL
0.4000 mg | SUBLINGUAL_TABLET | SUBLINGUAL | 3 refills | Status: DC | PRN
  Filled 2022-03-09: qty 25, 5d supply, fill #0

## 2022-03-09 NOTE — Telephone Encounter (Signed)
Pt daughter called and stated that she was under the impression that you would send in nitroglycerin to the pts pharmacy. Please advise.  ?

## 2022-03-10 ENCOUNTER — Telehealth: Payer: Self-pay

## 2022-03-10 NOTE — Telephone Encounter (Signed)
Contacted pt to schedule medicare wellness pt didn't answer lvm  °

## 2022-03-11 ENCOUNTER — Other Ambulatory Visit: Payer: Self-pay

## 2022-03-22 ENCOUNTER — Emergency Department (HOSPITAL_COMMUNITY): Payer: Medicare Other

## 2022-03-22 ENCOUNTER — Emergency Department (HOSPITAL_COMMUNITY)
Admission: EM | Admit: 2022-03-22 | Discharge: 2022-03-22 | Disposition: A | Discharge status: Home / Self Care | Payer: Medicare Other | Attending: Emergency Medicine | Admitting: Emergency Medicine

## 2022-03-22 DIAGNOSIS — R079 Chest pain, unspecified: Secondary | ICD-10-CM | POA: Insufficient documentation

## 2022-03-22 DIAGNOSIS — I2510 Atherosclerotic heart disease of native coronary artery without angina pectoris: Secondary | ICD-10-CM | POA: Insufficient documentation

## 2022-03-22 DIAGNOSIS — Z7982 Long term (current) use of aspirin: Secondary | ICD-10-CM | POA: Diagnosis not present

## 2022-03-22 DIAGNOSIS — Z955 Presence of coronary angioplasty implant and graft: Secondary | ICD-10-CM | POA: Insufficient documentation

## 2022-03-22 LAB — TROPONIN I (HIGH SENSITIVITY)
Troponin I (High Sensitivity): 24 ng/L — ABNORMAL HIGH (ref <18)
Troponin I (High Sensitivity): 26 ng/L — ABNORMAL HIGH (ref <18)

## 2022-03-22 LAB — BASIC METABOLIC PANEL
Anion gap: 7 (ref 5–15)
BUN: 17 mg/dL (ref 8–23)
CO2: 27 mmol/L (ref 22–32)
Calcium: 9 mg/dL (ref 8.9–10.3)
Chloride: 106 mmol/L (ref 98–111)
Creatinine, Ser: 1.1 mg/dL — ABNORMAL HIGH (ref 0.44–1.00)
GFR, Estimated: 53 mL/min — ABNORMAL LOW (ref >60)
Glucose, Bld: 118 mg/dL — ABNORMAL HIGH (ref 70–99)
Potassium: 4.7 mmol/L (ref 3.5–5.1)
Sodium: 140 mmol/L (ref 135–145)

## 2022-03-22 LAB — CBC WITH DIFFERENTIAL/PLATELET
Abs Immature Granulocytes: 0.03 10*3/uL (ref 0.00–0.07)
Basophils Absolute: 0.1 10*3/uL (ref 0.0–0.1)
Basophils Relative: 1 %
Eosinophils Absolute: 0.1 10*3/uL (ref 0.0–0.5)
Eosinophils Relative: 1 %
HCT: 36.4 % (ref 36.0–46.0)
Hemoglobin: 11.3 g/dL — ABNORMAL LOW (ref 12.0–15.0)
Immature Granulocytes: 0 %
Lymphocytes Relative: 34 %
Lymphs Abs: 3.3 10*3/uL (ref 0.7–4.0)
MCH: 27 pg (ref 26.0–34.0)
MCHC: 31 g/dL (ref 30.0–36.0)
MCV: 87.1 fL (ref 80.0–100.0)
Monocytes Absolute: 0.6 10*3/uL (ref 0.1–1.0)
Monocytes Relative: 6 %
Neutro Abs: 5.7 10*3/uL (ref 1.7–7.7)
Neutrophils Relative %: 58 %
Platelets: 268 10*3/uL (ref 150–400)
RBC: 4.18 MIL/uL (ref 3.87–5.11)
RDW: 14.6 % (ref 11.5–15.5)
WBC: 9.8 10*3/uL (ref 4.0–10.5)
nRBC: 0 % (ref 0.0–0.2)

## 2022-03-22 NOTE — ED Provider Notes (Signed)
?[facility] EMERGENCY DEPARTMENT ?Provider Note ? ? ?CSN: [id number] ?Arrival date & time: 03/22/22  1758 ? ?  ? ?History ? ?Chief Complaint  ?Patient presents with  ? Chest Pain  ? ? ?[name] is a 76 y.o. female.  Presented to the emergency department due to concern for chest pain.  Patient had an episode of pain at approximately 5:00.  Lasted for a few minutes, got better with nitroglycerin.  Was not associated with exertion, cardiac arrest.  Now has no ongoing complaints.  Patient had notable history of coronary artery disease, stent placed in March 2023.  Also has history of prior CVA, some baseline aphasia but is able to communicate mostly.  Additional history obtained from daughter at bedside.  Add'l hx from ems report as well. Given nitro and asa.  ? ?HPI ? ?  ? ?Home Medications ?Prior to Admission medications   ?Medication Sig Start Date End Date Taking? Authorizing Provider  ?acetaminophen (TYLENOL) 325 MG tablet Take 650 mg by mouth every 6 (six) hours as needed for pain or moderate pain.    [provider]  ?aspirin 81 MG chewable tablet Chew 81 mg by mouth in the morning.    [provider]  ?atenolol (TENORMIN) 25 MG tablet Take 1 tablet (25 mg total) by mouth daily. 12/17/21 12/17/22  Anders Simmonds, PA-C  ?atorvastatin (LIPITOR) 40 MG tablet Take 1 tablet (40 mg total) by mouth daily. 02/20/22 02/20/23  Patwardhan, Anabel Bene, MD  ?Blood Glucose Monitoring Suppl (ACCU-CHEK AVIVA) device Use as instructed daily. 01/09/20   Hoy Register, MD  ?clopidogrel (PLAVIX) 75 MG tablet Take 1 tablet by mouth daily. ?Patient taking differently: Take 75 mg by mouth daily. 12/17/21 12/17/22  Anders Simmonds, PA-C  ?diclofenac Sodium (VOLTAREN) 1 % GEL Apply 4 g topically 4 (four) times daily as needed (arthritis pain). 11/06/21   Glade Lloyd, MD  ?folic acid (FOLVITE) 1 MG tablet Take 1 tablet (1 mg total) by mouth daily. 11/06/21   Glade Lloyd, MD  ?glucose blood  (TRUE METRIX BLOOD GLUCOSE TEST) test strip Use as instructed 01/09/20   Hoy Register, MD  ?Lacosamide 100 MG TABS Take 1 tablet by mouth in the morning and at bedtime ?Patient taking differently: Take 100 mg by mouth in the morning and at bedtime. 10/20/21   Windell Norfolk, MD  ?Lancets (ACCU-CHEK MULTICLIX) lancets Use as instructed 01/09/20   Hoy Register, MD  ?lisinopril (ZESTRIL) 10 MG tablet TAKE 1 TABLET (10 MG TOTAL) BY MOUTH DAILY. ?Patient taking differently: Take 10 mg by mouth daily. 12/17/21 12/17/22  Anders Simmonds, PA-C  ?metFORMIN (GLUCOPHAGE) 500 MG tablet TAKE 2 TABLETS (1,000 MG TOTAL) BY MOUTH 2 (TWO) TIMES DAILY WITH A MEAL. ?Patient taking differently: Take 1,000 mg by mouth 2 (two) times daily with a meal. 12/17/21 12/17/22  Anders Simmonds, PA-C  ?nitroGLYCERIN (NITROSTAT) 0.4 MG SL tablet Place 1 tablet (0.4 mg total) under the tongue every 5 (five) minutes as needed for chest pain. 03/09/22 06/07/22  Cantwell, Celeste C, PA-C  ?traMADol (ULTRAM) 50 MG tablet Take 1 tablet (50 mg total) by mouth at bedtime as needed. For chronic bilateral osteoarthritis ?Patient taking differently: Take 50 mg by mouth at bedtime as needed for severe pain (chronic bilateral osteoarthritis). 06/18/21   Hoy Register, MD  ?vitamin B-12 (CYANOCOBALAMIN) 1000 MCG tablet Take 1 tablet (1,000 mcg total) by mouth daily. 11/06/21   Glade Lloyd, MD  ?   ? ?  Allergies    ?Penicillins and Sulfa antibiotics   ? ?Review of Systems   ?Review of Systems  ?Constitutional:  Negative for chills and fever.  ?HENT:  Negative for ear pain and sore throat.   ?Eyes:  Negative for pain and visual disturbance.  ?Respiratory:  Negative for cough and shortness of breath.   ?Cardiovascular:  Positive for chest pain. Negative for palpitations.  ?Gastrointestinal:  Negative for abdominal pain and vomiting.  ?Genitourinary:  Negative for dysuria and hematuria.  ?Musculoskeletal:  Negative for arthralgias and back pain.  ?Skin:   Negative for color change and rash.  ?Neurological:  Negative for seizures and syncope.  ?All other systems reviewed and are negative. ? ?Physical Exam ?Updated Vital Signs ?BP 135/72   Pulse 80   Temp 98.2 ?F (36.8 ?C)   Resp 16   LMP 09/28/2014 (Approximate)   SpO2 100%  ?Physical Exam ?Vitals and nursing note reviewed.  ?Constitutional:   ?   General: She is not in acute distress. ?   Appearance: She is well-developed.  ?HENT:  ?   Head: Normocephalic and atraumatic.  ?Eyes:  ?   Conjunctiva/sclera: Conjunctivae normal.  ?Cardiovascular:  ?   Rate and Rhythm: Normal rate and regular rhythm.  ?   Heart sounds: No murmur heard. ?Pulmonary:  ?   Effort: Pulmonary effort is normal. No respiratory distress.  ?   Breath sounds: Normal breath sounds.  ?Abdominal:  ?   Palpations: Abdomen is soft.  ?   Tenderness: There is no abdominal tenderness.  ?Musculoskeletal:     ?   General: No swelling.  ?   Cervical back: Neck supple.  ?Skin: ?   General: Skin is warm and dry.  ?   Capillary Refill: Capillary refill takes less than 2 seconds.  ?Neurological:  ?   Mental Status: She is alert.  ?Psychiatric:     ?   Mood and Affect: Mood normal.  ? ? ?ED Results / Procedures / Treatments   ?Labs ?(all labs ordered are listed, but only abnormal results are displayed) ?Labs Reviewed  ?CBC WITH DIFFERENTIAL/PLATELET - Abnormal; Notable for the following components:  ?    Result Value  ? Hemoglobin 11.3 (*)   ? All other components within normal limits  ?BASIC METABOLIC PANEL - Abnormal; Notable for the following components:  ? Glucose, Bld 118 (*)   ? Creatinine, Ser 1.10 (*)   ? GFR, Estimated 53 (*)   ? All other components within normal limits  ?TROPONIN I (HIGH SENSITIVITY) - Abnormal; Notable for the following components:  ? Troponin I (High Sensitivity) 24 (*)   ? All other components within normal limits  ?TROPONIN I (HIGH SENSITIVITY) - Abnormal; Notable for the following components:  ? Troponin I (High Sensitivity) 26  (*)   ? All other components within normal limits  ? ? ?EKG ?EKG Interpretation ? ?Date/Time:  Monday Mar 22 2022 18:01:40 EDT ?Ventricular Rate:  67 ?PR Interval:  158 ?QRS Duration: 96 ?QT Interval:  424 ?QTC Calculation: 448 ?R Axis:   56 ?Text Interpretation: Sinus rhythm Low voltage, precordial leads Borderline T wave abnormalities Confirmed by Marianna Fussykstra, Amahd Morino ([id number]) on 03/22/2022 6:10:08 PM ? ?Radiology ?DG Chest Portable 1 View ? ?Result Date: 03/22/2022 ?CLINICAL DATA:  Chest pain EXAM: PORTABLE CHEST 1 VIEW COMPARISON:  02/19/2022 FINDINGS: Stable cardiomediastinal contours. Low lung volumes. No focal airspace consolidation, pleural effusion, or pneumothorax. IMPRESSION: Low lung volumes. No acute cardiopulmonary findings. Electronically Signed  By: Duanne Guess D.O.   On: 03/22/2022 18:59   ? ?Procedures ?Procedures  ? ? ?Medications Ordered in ED ?Medications - No data to display ? ?ED Course/ Medical Decision Making/ A&P ?Clinical Course as of 03/22/22 2319  ?Mon Mar 22, 2022  ?2005 D/w Jacinto Halim - advises checking second trop - if stable, then advises dc home if continues to be pain free [RD]  ?  ?Clinical Course User Index ?[RD] Milagros Loll, MD  ? ?                        ?Medical Decision Making ?Amount and/or Complexity of Data Reviewed ?Labs: ordered. ?Radiology: ordered. ? ? ?76 year old lady with history of coronary artery disease, CVA presenting to ER due to concern for episode of chest pain.  In March 2023, she had MI, underwent PCI, stent placement to her left circumflex.  When I evaluated her she had no ongoing pain.  I reviewed her EKG, no acute ischemic findings noted.  Troponin is 24.  CXR is negative.  I independently reviewed, no infiltrate to suggest pneumonia or pneumothorax.  No significant anemia or electrolyte derangement.  I reviewed case with Dr. Jacinto Halim on-call for [facility] cardiology.  He states that so long as patient is pain-free and her repeat troponin is stable then she  can be discharged and managed in the outpatient setting.  Patient was observed in ER for a few hours.  She had no recurrence of her chest pain.  Her repeat troponin was 26.  Given these finding and discussion wit

## 2022-03-22 NOTE — ED Notes (Signed)
Pt verbalized understanding of d/c instructions, meds, and followup care. Denies questions. VSS, no distress noted. Steady gait to exit with all belongings.  ?

## 2022-03-22 NOTE — ED Triage Notes (Signed)
BIB [facility] from home. Central non radiating CP starting at 1700 took one nitro with some relief. Given one more nitro by EMS plus 324 ASA. Hx MI and stent in March 2023. Some difficulty speaking due to previous CVA- at baseline according to family on scene. ? ?EMS vitals ?150/76-->2nd nitro-->138/70 ? ? ?

## 2022-03-22 NOTE — ED Notes (Signed)
Daughter called to give update- Enroute to hospital. ?

## 2022-03-22 NOTE — Discharge Instructions (Addendum)
Please follow-up with your cardiologist.  Call to make an appointment.  If you develop recurrent chest pain, any difficulty breathing or other new concerning symptom, come back to ER for reassessment. ?

## 2022-03-25 ENCOUNTER — Other Ambulatory Visit: Payer: Self-pay

## 2022-04-16 ENCOUNTER — Other Ambulatory Visit: Payer: Self-pay

## 2022-04-20 ENCOUNTER — Other Ambulatory Visit: Payer: Self-pay

## 2022-04-20 ENCOUNTER — Other Ambulatory Visit: Payer: Self-pay | Admitting: Family Medicine

## 2022-04-20 DIAGNOSIS — E1169 Type 2 diabetes mellitus with other specified complication: Secondary | ICD-10-CM

## 2022-04-21 ENCOUNTER — Other Ambulatory Visit: Payer: Self-pay

## 2022-04-27 ENCOUNTER — Ambulatory Visit: Payer: Medicare Other | Attending: Nurse Practitioner | Admitting: Nurse Practitioner

## 2022-04-27 ENCOUNTER — Other Ambulatory Visit: Payer: Self-pay

## 2022-04-27 ENCOUNTER — Encounter: Payer: Self-pay | Admitting: Nurse Practitioner

## 2022-04-27 ENCOUNTER — Other Ambulatory Visit: Payer: Self-pay | Admitting: Family Medicine

## 2022-04-27 DIAGNOSIS — E1169 Type 2 diabetes mellitus with other specified complication: Secondary | ICD-10-CM

## 2022-04-27 DIAGNOSIS — D649 Anemia, unspecified: Secondary | ICD-10-CM | POA: Diagnosis not present

## 2022-04-27 MED ORDER — METFORMIN HCL 500 MG PO TABS
ORAL_TABLET | Freq: Two times a day (BID) | ORAL | 0 refills | Status: DC
  Filled 2022-04-27: qty 120, 30d supply, fill #0
  Filled 2022-06-01: qty 120, 30d supply, fill #1
  Filled 2022-07-05: qty 120, 30d supply, fill #2

## 2022-04-27 NOTE — Progress Notes (Signed)
Virtual Visit via Telephone Note  I discussed the limitations, risks, security and privacy concerns of performing an evaluation and management service by telephone and the availability of in person appointments. I also discussed with the patient that there may be a patient responsible charge related to this service. The patient expressed understanding and agreed to proceed.    I connected with [name] on 04/27/22  at   2:30 PM EDT  EDT by telephone and verified that I am speaking with the correct person using two identifiers.  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   Requesting refill of metformin today. Creatinine slightly elevated with normal BUN. She has been instructed to follow up for repeat labs in a few weeks. Diabetes controlled. LDL not quite at goal although she endorses adherence taking atorvastatin 20 mg daily.  Lab Results  Component Value Date   HGBA1C 7.2 (H) 02/19/2022    Lab Results  Component Value Date   CREATININE 1.10 (H) 03/22/2022    Lab Results  Component Value Date   LDLCALC 84 02/19/2022       Past Medical History:  Diagnosis Date   Diabetes mellitus    H/O: CVA (cerebrovascular accident) 07/08/2014   Hypertension    Stroke [facility])     Past Surgical History:  Procedure Laterality Date   CORONARY STENT INTERVENTION N/A 02/19/2022   Procedure: CORONARY STENT INTERVENTION;  Surgeon: Nigel Mormon, MD;  Location: [place] [facility];  Service: Cardiovascular;  Laterality: N/A;   CORONARY/GRAFT ACUTE MI REVASCULARIZATION N/A 02/19/2022    Procedure: Coronary/Graft Acute MI Revascularization;  Surgeon: Nigel Mormon, MD;  Location: [place] [facility];  Service: Cardiovascular;  Laterality: N/A;   LEFT HEART CATH AND CORONARY ANGIOGRAPHY N/A 02/19/2022   Procedure: LEFT HEART CATH AND CORONARY ANGIOGRAPHY;  Surgeon: Nigel Mormon, MD;  Location: [place] [facility];  Service: Cardiovascular;  Laterality: N/A;   NO PAST SURGERIES      Family History  Problem Relation Age of Onset   Hyperlipidemia Mother    Hypertension Mother    Diabetes Mother    Stroke Father     Social History   Socioeconomic History   Marital status: Widowed    Spouse name: Not on file   Number of children: 3   Years of education: 12   Highest education level: High school graduate  Occupational History   Occupation: Retired  Tobacco Use   Smoking status: Former    Packs/day: 1.00    Years: 48.00    Pack years: 48.00    Types: Cigarettes    Quit date: 2023    Years since quitting: 0.4   Smokeless tobacco: Never  Vaping Use   Vaping Use: Never used  Substance and Sexual Activity   Alcohol use: No   Drug use: No   Sexual activity: Not on file  Other Topics Concern   Not on file  Social History Narrative   Lives with daughter, Hassan Rowan, and her grandchildren.   Right-handed.   Three cups caffeine daily.   Social Determinants of Health   Financial Resource Strain: Not  on file  Food Insecurity: Not on file  Transportation Needs: Not on file  Physical Activity: Not on file  Stress: Not on file  Social Connections: Not on file     Observations/Objective: Awake, alert and oriented x 3   Review of Systems  Constitutional:  Negative for fever, malaise/fatigue and weight loss.  HENT: Negative.  Negative for nosebleeds.   Eyes: Negative.  Negative for blurred vision, double vision and photophobia.  Respiratory: Negative.  Negative for cough and shortness of breath.   Cardiovascular: Negative.  Negative for chest pain,  palpitations and leg swelling.  Gastrointestinal: Negative.  Negative for heartburn, nausea and vomiting.  Musculoskeletal: Negative.  Negative for myalgias.  Neurological: Negative.  Negative for dizziness, focal weakness, seizures and headaches.  Psychiatric/Behavioral: Negative.  Negative for suicidal ideas.    Assessment and Plan: Diagnoses and all orders for this visit:  Type 2 diabetes mellitus with other specified complication, without long-term current use of insulin (HCC) -     metFORMIN (GLUCOPHAGE) 500 MG tablet; TAKE 2 TABLETS (1,000 MG TOTAL) BY MOUTH 2 (TWO) TIMES DAILY WITH A MEAL. (Patient will need to make an office visit for next refill) -     CMP14+EGFR  Anemia, unspecified type -     CBC     Follow Up Instructions Return in about 3 months (around 07/28/2022).     I discussed the assessment and treatment plan with the patient. The patient was provided an opportunity to ask questions and all were answered. The patient agreed with the plan and demonstrated an understanding of the instructions.   The patient was advised to call back or seek an in-person evaluation if the symptoms worsen or if the condition fails to improve as anticipated.  I provided 6 minutes of non-face-to-face time during this encounter including median intraservice time, reviewing previous notes, labs, imaging, medications and explaining diagnosis and management.  Gildardo Pounds, FNP-BC

## 2022-04-28 ENCOUNTER — Telehealth (HOSPITAL_COMMUNITY): Payer: Self-pay

## 2022-04-28 NOTE — Telephone Encounter (Signed)
Pt had a hospital visit on 5/1 for chest pain. Passed ppw work to Art therapist for review before scheduling for cardiac rehab.

## 2022-05-03 ENCOUNTER — Other Ambulatory Visit: Payer: Self-pay | Admitting: Physician Assistant

## 2022-05-03 DIAGNOSIS — Z8673 Personal history of transient ischemic attack (TIA), and cerebral infarction without residual deficits: Secondary | ICD-10-CM

## 2022-05-04 ENCOUNTER — Other Ambulatory Visit: Payer: Self-pay

## 2022-05-04 MED ORDER — CLOPIDOGREL BISULFATE 75 MG PO TABS
ORAL_TABLET | Freq: Every day | ORAL | 1 refills | Status: DC
  Filled 2022-05-04: qty 30, 30d supply, fill #0
  Filled 2022-06-01: qty 30, 30d supply, fill #1
  Filled 2022-07-09: qty 30, 30d supply, fill #2
  Filled 2022-08-06: qty 30, 30d supply, fill #3

## 2022-05-04 NOTE — Telephone Encounter (Signed)
Requested Prescriptions  Pending Prescriptions Disp Refills  . clopidogrel (PLAVIX) 75 MG tablet 90 tablet 0    Sig: Take 1 tablet by mouth daily.     Hematology: Antiplatelets - clopidogrel Failed - 05/03/2022  3:29 PM      Failed - HGB in normal range and within 180 days    Hemoglobin  Date Value Ref Range Status  03/22/2022 11.3 (L) 12.0 - 15.0 g/dL Final  01/01/2021 13.9 11.1 - 15.9 g/dL Final         Failed - Cr in normal range and within 360 days    Creatinine, Ser  Date Value Ref Range Status  03/22/2022 1.10 (H) 0.44 - 1.00 mg/dL Final         Failed - Valid encounter within last 6 months    Recent Outpatient Visits          1 week ago Type 2 diabetes mellitus with other specified complication, without long-term current use of insulin [facility])   [place], Vernia Buff, NP   4 months ago Hospital discharge follow-up   [place], Levada Dy M, [place]   10 months ago Essential hypertension   [place], [place], MD   1 year ago Type 2 diabetes mellitus with other specified complication, without long-term current use of insulin ([place])   [place], [place], MD   1 year ago Type 2 diabetes mellitus with other specified complication, without long-term current use of insulin [facility])   [place], Dionne Bucy, [place]      Future Appointments            In 3 weeks [place], Gerline Legacy, PA-C [facility]   In 3 months Charlott Rakes, MD [place] - HCT in normal range and within 180 days    HCT  Date Value Ref Range Status  03/22/2022 36.4 36.0 - 46.0 % Final   Hematocrit  Date Value Ref Range Status  01/01/2021 42.2 34.0 - 46.6 % Final         Passed - PLT in normal range and within 180 days    Platelets  Date Value Ref Range  Status  03/22/2022 268 150 - 400 K/uL Final  01/01/2021 331 150 - 450 x10E3/uL Final

## 2022-05-27 NOTE — Progress Notes (Signed)
Primary Physician/Referring:  Charlott Rakes, MD  Patient ID: Jearld Lesch, female    DOB: 06/26/1946, 76 y.o.   MRN: [id number]  Chief Complaint  Patient presents with   Coronary Artery Disease    3 month   HPI:    JADALYN OLIVERI  is a 76 y.o. 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 She udnerwent primary PCI to 95% mid Lcx stenosis.   Patient was last seen in the office 03/05/2022 at which time recommended repeat lipid profile testing, unfortunately this has not been done.  She was otherwise stable from a cardiovascular standpoint.  Since then patient was evaluated in the ED for atypical chest pain, ACS ruled out.  She now presents for 75-monthfollow-up.  Patient is feeling well without specific complaints today.  She has had no recurrence of chest pain. Denies dyspnea, orthopnea, PND.    Past Medical History:  Diagnosis Date   Diabetes mellitus    H/O: CVA (cerebrovascular accident) 07/08/2014   Hypertension    Stroke ([facility]    Past Surgical History:  Procedure Laterality Date   CORONARY STENT INTERVENTION N/A 02/19/2022   Procedure: CORONARY STENT INTERVENTION;  Surgeon: PNigel Mormon MD;  [place] [place] [facility];  Service: Cardiovascular;  Laterality: N/A;   CORONARY/GRAFT ACUTE MI REVASCULARIZATION N/A 02/19/2022   Procedure: Coronary/Graft Acute MI Revascularization;  Surgeon: PNigel Mormon MD;  [place] [facility];  Service: Cardiovascular;  Laterality: N/A;   LEFT HEART CATH AND CORONARY ANGIOGRAPHY N/A 02/19/2022   Procedure: LEFT HEART CATH AND CORONARY ANGIOGRAPHY;  Surgeon: PNigel Mormon MD;  [facility];  Service: Cardiovascular;  Laterality: N/A;   NO PAST SURGERIES     Family History  Problem Relation Age of Onset   Hyperlipidemia Mother    Hypertension Mother     Diabetes Mother    Stroke Father     Social History   Tobacco Use   Smoking status: Former    Packs/day: 1.00    Years: 48.00    Total pack years: 48.00    Types: Cigarettes    Quit date: 2023    Years since quitting: 0.5   Smokeless tobacco: Never  Substance Use Topics   Alcohol use: No   Marital Status: Widowed   ROS  Review of Systems  Constitutional: Negative for malaise/fatigue and weight gain.  Cardiovascular:  Negative for chest pain, claudication, dyspnea on exertion, leg swelling, near-syncope, orthopnea, palpitations, paroxysmal nocturnal dyspnea and syncope.  Neurological:  Negative for dizziness.   Objective  Blood pressure 138/68, pulse 80, temperature 98 F (36.7 C), temperature source Temporal, resp. rate 17, height _0  (1.6 m), weight 189 lb 6.4 oz (85.9 kg), last menstrual period 09/28/2014, SpO2 99 %.     05/28/2022   10:15 AM 03/22/2022   11:17 PM 03/22/2022   10:15 PM  Vitals with BMI  Height _1     Weight 189 lbs 6 oz    BMI 386.77   Systolic [id number] Diastolic 68 72 48  Pulse 80 80 71     Physical Exam Vitals reviewed.  Constitutional:      Appearance: She is obese.  Cardiovascular:     Rate and Rhythm: Normal rate and regular rhythm.     Pulses: Intact distal pulses.     Heart sounds: S1 normal  and S2 normal. No murmur heard.    No gallop.  Pulmonary:     Effort: Pulmonary effort is normal. No respiratory distress.     Breath sounds: No wheezing, rhonchi or rales.  Musculoskeletal:     Right lower leg: No edema.     Left lower leg: No edema.  Neurological:     Mental Status: She is alert.     Comments: Left hemiparesis     Laboratory examination:   Recent Labs    02/19/22 0654 02/19/22 0755 02/20/22 0241 03/22/22 1900  NA 137 136 140 140  K 4.1 3.8 4.0 4.7  CL 105 100 106 106  CO2 24  --  24 27  GLUCOSE 175* 146* 166* 118*  BUN _0 CREATININE 1.06* 0.70 0.92 1.10*  CALCIUM 8.7*  --  8.5* 9.0  GFRNONAA 55*  --   >60 53*   CrCl cannot be calculated (Patient's most recent lab result is older than the maximum 21 days allowed.).     Latest Ref Rng & Units 03/22/2022    7:00 PM 02/20/2022    2:41 AM 02/19/2022    7:55 AM  CMP  Glucose 70 - 99 mg/dL 118  166  146   BUN 8 - 23 mg/dL _1 Creatinine 0.44 - 1.00 mg/dL 1.10  0.92  0.70   Sodium 135 - 145 mmol/L 140  140  136   Potassium 3.5 - 5.1 mmol/L 4.7  4.0  3.8   Chloride 98 - 111 mmol/L 106  106  100   CO2 22 - 32 mmol/L 27  24    Calcium 8.9 - 10.3 mg/dL 9.0  8.5        Latest Ref Rng & Units 03/22/2022    7:00 PM 02/20/2022    2:41 AM 02/19/2022    7:55 AM  CBC  WBC 4.0 - 10.5 K/uL 9.8  11.1    Hemoglobin 12.0 - 15.0 g/dL 11.3  13.6  11.9   Hematocrit 36.0 - 46.0 % 36.4  43.0  35.0   Platelets 150 - 400 K/uL 268  280      Lipid Panel Recent Labs    11/05/21 0253 02/19/22 0654  CHOL 110 165  TRIG 103 148  LDLCALC 50 84  VLDL 21 30  HDL 39* 51  CHOLHDL 2.8 3.2   HEMOGLOBIN A1C Lab Results  Component Value Date   HGBA1C 7.2 (H) 02/19/2022   MPG 159.94 02/19/2022   TSH Recent Labs    11/05/21 1324  TSH 1.497   External labs:  None   Allergies   Allergies  Allergen Reactions   Penicillins Itching   Sulfa Antibiotics Other (See Comments)    Reaction not recalled, but patient was told she was allergic    Medications Prior to Visit:   Outpatient Medications Prior to Visit  Medication Sig Dispense Refill   acetaminophen (TYLENOL) 325 MG tablet Take 650 mg by mouth every 6 (six) hours as needed for pain or moderate pain.     aspirin 81 MG chewable tablet Chew 81 mg by mouth in the morning.     atenolol (TENORMIN) 25 MG tablet Take 1 tablet (25 mg total) by mouth daily. 90 tablet 1   atorvastatin (LIPITOR) 40 MG tablet Take 1 tablet (40 mg total) by mouth daily. 90 tablet 1   Blood Glucose Monitoring Suppl (ACCU-CHEK AVIVA) device Use as instructed daily. 1 each 0  clopidogrel (PLAVIX) 75 MG tablet Take 1 tablet by  mouth once daily. 90 tablet 1   diclofenac Sodium (VOLTAREN) 1 % GEL Apply 4 g topically 4 (four) times daily as needed (arthritis pain).     folic acid (FOLVITE) 1 MG tablet Take 1 tablet (1 mg total) by mouth daily. 30 tablet 0   glucose blood (TRUE METRIX BLOOD GLUCOSE TEST) test strip Use as instructed 100 each 6   Lacosamide 100 MG TABS Take 1 tablet by mouth in the morning and at bedtime (Patient taking differently: Take 100 mg by mouth in the morning and at bedtime.) 180 tablet 4   Lancets (ACCU-CHEK MULTICLIX) lancets Use as instructed 100 each 12   lisinopril (ZESTRIL) 10 MG tablet TAKE 1 TABLET (10 MG TOTAL) BY MOUTH DAILY. (Patient taking differently: Take 10 mg by mouth daily.) 90 tablet 3   metFORMIN (GLUCOPHAGE) 500 MG tablet TAKE 2 TABLETS (1,000 MG TOTAL) BY MOUTH 2 (TWO) TIMES DAILY WITH A MEAL. (Patient will need to make an office visit for next refill) 360 tablet 0   nitroGLYCERIN (NITROSTAT) 0.4 MG SL tablet Place 1 tablet (0.4 mg total) under the tongue every 5 (five) minutes as needed for chest pain. 30 tablet 3   traMADol (ULTRAM) 50 MG tablet Take 1 tablet (50 mg total) by mouth at bedtime as needed. For chronic bilateral osteoarthritis (Patient taking differently: Take 50 mg by mouth at bedtime as needed for severe pain (chronic bilateral osteoarthritis).) 30 tablet 1   vitamin B-12 (CYANOCOBALAMIN) 1000 MCG tablet Take 1 tablet (1,000 mcg total) by mouth daily. 30 tablet 0   No facility-administered medications prior to visit.   Final Medications at End of Visit    Current Meds  Medication Sig   acetaminophen (TYLENOL) 325 MG tablet Take 650 mg by mouth every 6 (six) hours as needed for pain or moderate pain.   aspirin 81 MG chewable tablet Chew 81 mg by mouth in the morning.   atenolol (TENORMIN) 25 MG tablet Take 1 tablet (25 mg total) by mouth daily.   atorvastatin (LIPITOR) 40 MG tablet Take 1 tablet (40 mg total) by mouth daily.   Blood Glucose Monitoring Suppl  (ACCU-CHEK AVIVA) device Use as instructed daily.   clopidogrel (PLAVIX) 75 MG tablet Take 1 tablet by mouth once daily.   diclofenac Sodium (VOLTAREN) 1 % GEL Apply 4 g topically 4 (four) times daily as needed (arthritis pain).   folic acid (FOLVITE) 1 MG tablet Take 1 tablet (1 mg total) by mouth daily.   glucose blood (TRUE METRIX BLOOD GLUCOSE TEST) test strip Use as instructed   Lacosamide 100 MG TABS Take 1 tablet by mouth in the morning and at bedtime (Patient taking differently: Take 100 mg by mouth in the morning and at bedtime.)   Lancets (ACCU-CHEK MULTICLIX) lancets Use as instructed   lisinopril (ZESTRIL) 10 MG tablet TAKE 1 TABLET (10 MG TOTAL) BY MOUTH DAILY. (Patient taking differently: Take 10 mg by mouth daily.)   metFORMIN (GLUCOPHAGE) 500 MG tablet TAKE 2 TABLETS (1,000 MG TOTAL) BY MOUTH 2 (TWO) TIMES DAILY WITH A MEAL. (Patient will need to make an office visit for next refill)   nitroGLYCERIN (NITROSTAT) 0.4 MG SL tablet Place 1 tablet (0.4 mg total) under the tongue every 5 (five) minutes as needed for chest pain.   traMADol (ULTRAM) 50 MG tablet Take 1 tablet (50 mg total) by mouth at bedtime as needed. For chronic bilateral osteoarthritis (Patient taking  differently: Take 50 mg by mouth at bedtime as needed for severe pain (chronic bilateral osteoarthritis).)   vitamin B-12 (CYANOCOBALAMIN) 1000 MCG tablet Take 1 tablet (1,000 mcg total) by mouth daily.   Radiology:   No results found.  Cardiac Studies:  Echocardiogram 02/19/2021:  1. Left ventricular ejection fraction, by estimation, is 55 to 60%. The  left ventricle has normal function. The left ventricle has no regional  wall motion abnormalities. There is moderate left ventricular hypertrophy  of the basal-septal segment. Left  ventricular diastolic parameters are consistent with Grade I diastolic  dysfunction (impaired relaxation).   2. Right ventricular systolic function is normal. The right ventricular  size  is normal.   3. The mitral valve is abnormal. Mild to moderate mitral valve  regurgitation. Severe mitral annular calcification.   4. The aortic valve is tricuspid. Aortic valve regurgitation is not  visualized.   Comparison(s): A prior study was performed on 03/13/2021. Which showed  trivial mitral regurgitation. no other significant change noted.    Coronary intervention 02/19/2022: LM: Normal LAD: Minimal luminal irregularities Lcx: Prox 50% disease (non-culprit)        Prox-mid 95% stenosis (culprit) RCA: Prox 20% disease   LVEDP 27 mmHg   Successful percutaneous coronary intervention mid LCx        PTCA and stent placement 3.0 X 20 mm Synergy drug-eluting stent        Post dilatation with 3.25X15 mm [place] balloon at 16 atm 95%-->0% stenosis at culprit lesion TIMI III flow  EKG:   03/08/2022: Sinus rhythm at a rate of 72 bpm.  Normal axis.  Poor R wave progression, cannot exclude anteroseptal infarct old.  Nonspecific T wave abnormality.  IVCD.  EKG 02/20/2022: Sinus rhythm Inferolateral infarct, age indeterminate Expeted evolution  Assessment     ICD-10-CM   1. Coronary artery disease involving native coronary artery of native heart without angina pectoris  I25.10 Lipid Panel With LDL/HDL Ratio    2. History of ST elevation myocardial infarction (STEMI)  I25.2 Lipid Panel With LDL/HDL Ratio    3. Essential hypertension  I10        There are no discontinued medications.  No orders of the defined types were placed in this encounter.   Recommendations:   SUNDEEP CARY is a 76 y.o. 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 She udnerwent primary PCI to 95% mid Lcx stenosis.   Patient was last seen in the office 03/05/2022 at which time recommended repeat lipid profile testing, unfortunately this has not been done.  She was otherwise stable  from a cardiovascular standpoint.  Since then patient was evaluated in the ED for atypical chest pain, ACS ruled out.  She now presents for 5-monthfollow-up.  His blood pressure remains well controlled.  She is presently asymptomatic and tolerating baseline activity without issue.  We will obtain repeat lipid profile testing.  No changes made at today's office visit.  Follow-up in 6 months, sooner if needed.   CAlethia Berthold PA-C 05/28/2022, 11:03 AM Office: [phone]

## 2022-05-28 ENCOUNTER — Emergency Department (HOSPITAL_COMMUNITY): Payer: Medicare Other

## 2022-05-28 ENCOUNTER — Ambulatory Visit: Payer: Medicare Other | Admitting: Student

## 2022-05-28 ENCOUNTER — Encounter: Payer: Self-pay | Admitting: Student

## 2022-05-28 ENCOUNTER — Other Ambulatory Visit: Payer: Self-pay

## 2022-05-28 ENCOUNTER — Emergency Department (HOSPITAL_COMMUNITY)
Admission: EM | Admit: 2022-05-28 | Discharge: 2022-05-29 | Disposition: A | Discharge status: Home / Self Care | Payer: Medicare Other | Attending: Emergency Medicine | Admitting: Emergency Medicine

## 2022-05-28 VITALS — BP 138/68 | HR 80 | Temp 98.0°F | Resp 17 | Ht 63.0 in | Wt 189.4 lb

## 2022-05-28 DIAGNOSIS — R410 Disorientation, unspecified: Secondary | ICD-10-CM | POA: Diagnosis present

## 2022-05-28 DIAGNOSIS — I10 Essential (primary) hypertension: Secondary | ICD-10-CM | POA: Diagnosis not present

## 2022-05-28 DIAGNOSIS — R531 Weakness: Secondary | ICD-10-CM | POA: Diagnosis not present

## 2022-05-28 DIAGNOSIS — E119 Type 2 diabetes mellitus without complications: Secondary | ICD-10-CM | POA: Diagnosis not present

## 2022-05-28 DIAGNOSIS — I6602 Occlusion and stenosis of left middle cerebral artery: Secondary | ICD-10-CM | POA: Diagnosis not present

## 2022-05-28 DIAGNOSIS — Z20822 Contact with and (suspected) exposure to covid-19: Secondary | ICD-10-CM | POA: Diagnosis not present

## 2022-05-28 DIAGNOSIS — Z7984 Long term (current) use of oral hypoglycemic drugs: Secondary | ICD-10-CM | POA: Insufficient documentation

## 2022-05-28 DIAGNOSIS — Z7902 Long term (current) use of antithrombotics/antiplatelets: Secondary | ICD-10-CM | POA: Insufficient documentation

## 2022-05-28 DIAGNOSIS — R42 Dizziness and giddiness: Secondary | ICD-10-CM | POA: Diagnosis not present

## 2022-05-28 DIAGNOSIS — R519 Headache, unspecified: Secondary | ICD-10-CM | POA: Insufficient documentation

## 2022-05-28 DIAGNOSIS — Z7982 Long term (current) use of aspirin: Secondary | ICD-10-CM | POA: Diagnosis not present

## 2022-05-28 DIAGNOSIS — I252 Old myocardial infarction: Secondary | ICD-10-CM

## 2022-05-28 DIAGNOSIS — Z79899 Other long term (current) drug therapy: Secondary | ICD-10-CM | POA: Diagnosis not present

## 2022-05-28 DIAGNOSIS — E7800 Pure hypercholesterolemia, unspecified: Secondary | ICD-10-CM | POA: Insufficient documentation

## 2022-05-28 DIAGNOSIS — R258 Other abnormal involuntary movements: Secondary | ICD-10-CM | POA: Insufficient documentation

## 2022-05-28 DIAGNOSIS — R569 Unspecified convulsions: Secondary | ICD-10-CM | POA: Insufficient documentation

## 2022-05-28 DIAGNOSIS — I2510 Atherosclerotic heart disease of native coronary artery without angina pectoris: Secondary | ICD-10-CM

## 2022-05-28 LAB — CBG MONITORING, ED: Glucose-Capillary: 114 mg/dL — ABNORMAL HIGH (ref 70–99)

## 2022-05-28 LAB — DIFFERENTIAL
Abs Immature Granulocytes: 0.03 10*3/uL (ref 0.00–0.07)
Basophils Absolute: 0 10*3/uL (ref 0.0–0.1)
Basophils Relative: 0 %
Eosinophils Absolute: 0.1 10*3/uL (ref 0.0–0.5)
Eosinophils Relative: 1 %
Immature Granulocytes: 0 %
Lymphocytes Relative: 29 %
Lymphs Abs: 2.7 10*3/uL (ref 0.7–4.0)
Monocytes Absolute: 0.5 10*3/uL (ref 0.1–1.0)
Monocytes Relative: 6 %
Neutro Abs: 5.8 10*3/uL (ref 1.7–7.7)
Neutrophils Relative %: 64 %

## 2022-05-28 LAB — CBC
HCT: 39.1 % (ref 36.0–46.0)
Hemoglobin: 12 g/dL (ref 12.0–15.0)
MCH: 26.3 pg (ref 26.0–34.0)
MCHC: 30.7 g/dL (ref 30.0–36.0)
MCV: 85.7 fL (ref 80.0–100.0)
Platelets: 290 10*3/uL (ref 150–400)
RBC: 4.56 MIL/uL (ref 3.87–5.11)
RDW: 14.2 % (ref 11.5–15.5)
WBC: 9.2 10*3/uL (ref 4.0–10.5)
nRBC: 0 % (ref 0.0–0.2)

## 2022-05-28 LAB — RESP PANEL BY RT-PCR (FLU A&B, COVID) ARPGX2
Influenza A by PCR: NEGATIVE
Influenza B by PCR: NEGATIVE
SARS Coronavirus 2 by RT PCR: NEGATIVE

## 2022-05-28 MED ORDER — SODIUM CHLORIDE 0.9 % IV BOLUS: 500.0000 mL | Freq: Once | INTRAVENOUS | Status: DC

## 2022-05-28 MED ORDER — SODIUM CHLORIDE 0.9 % IV SOLN: 100.0000 mL/h | INTRAVENOUS | Status: DC

## 2022-05-28 MED ORDER — LORAZEPAM 2 MG/ML IJ SOLN
0.5000 mg | Freq: Once | INTRAMUSCULAR | Status: AC
  Administered 2022-05-28: 0.5 mg via INTRAVENOUS
  Filled 2022-05-28: qty 1

## 2022-05-28 NOTE — ED Notes (Signed)
IV team at bedside again to obtain IV access and blood work for patient.

## 2022-05-28 NOTE — Consult Note (Signed)
Neurology Consultation Reason for Consult: Speech difficulty Referring Physician: Para Skeans  CC: Speech difficulty  History is obtained from: Patient, chart  HPI: [name] is a 76 y.o. female with a history of diabetes, previous stroke, seizures who presents after having an abrupt onset change.  She states that she has been having some degree of dizziness since about 5:30 PM.  Around 745, however, she was still interactive but then shortly thereafter family found her on the ground with mumbled speech.  Patient tells me she intermittently has episodes of difficulty speaking that happen from time to time, though she is not certain how often.  She does have a history of seizures with left frontal sharp waves.  Her daughter states that when she first found her she was just mumbling, but when EMS got there, she had a completely blank stare and her feet and arms were tensed up.  On arrival here, she was having a lot of difficulty speaking, but this was gradually improving by the time my evaluation.  She still did have some aphasia, but she was able to tell me that she had been having some dizziness since at least 5:30 PM.   LKW: 5:30 PM tpa given?: no, outside of window   ROS: A 14 point ROS was performed and is negative except as noted in the HPI. *** Unable to obtain due to altered mental status.   Past Medical History:  Diagnosis Date   Diabetes mellitus    H/O: CVA (cerebrovascular accident) 07/08/2014   Hypertension    Stroke (HCC)    ***  Family History  Problem Relation Age of Onset   Hyperlipidemia Mother    Hypertension Mother    Diabetes Mother    Stroke Father    ***  Social History:  reports that she quit smoking about 6 months ago. Her smoking use included cigarettes. She has a 48.00 pack-year smoking history. She has never used smokeless tobacco. She reports that she does not drink alcohol and does not use drugs. ***  Exam: Current vital signs: BP (!)  155/80   Pulse 68   Temp (!) 97.4 F (36.3 C) (Oral)   Resp 20   LMP 09/28/2014 (Approximate)   SpO2 100%  Vital signs in last 24 hours: Temp:  [97.4 F (36.3 C)-98 F (36.7 C)] 97.4 F (36.3 C) (07/07 2141) Pulse Rate:  [68-80] 68 (07/07 2130) Resp:  [17-20] 20 (07/07 2130) BP: (135-155)/(68-80) 155/80 (07/07 2130) SpO2:  [99 %-100 %] 100 % (07/07 2130) Weight:  [85.9 kg] 85.9 kg (07/07 1015)   Physical Exam  Constitutional: Appears well-developed and well-nourished.  Psych: Affect appropriate to situation Eyes: No scleral injection HENT: No OP obstruction MSK: no joint deformities.  Cardiovascular: Normal rate and regular rhythm.  Respiratory: Effort normal, non-labored breathing GI: Soft.  No distension. There is no tenderness.  Skin: WDI  Neuro: Mental Status: Patient is awake, alert, oriented to person, place, month, year, and situation.*** Patient is able to give a clear and coherent history.*** No signs of aphasia or neglect*** Cranial Nerves: II: Visual Fields are full. Pupils are equal, round, and reactive to light.  *** III,IV, VI: EOMI without ptosis or diploplia.  V: Facial sensation is symmetric to temperature VII: Facial movement is symmetric.  VIII: hearing is intact to voice X: Uvula elevates symmetrically XI: Shoulder shrug is symmetric. XII: tongue is midline without atrophy or fasciculations.  Motor: Tone is normal. Bulk is normal. 5/5 strength was present  in all four extremities. *** Sensory: Sensation is symmetric to light touch and temperature in the arms and legs.*** Deep Tendon Reflexes: 2+ and symmetric in the biceps and patellae. *** Plantars: Toes are downgoing bilaterally. *** Cerebellar: FNF and HKS are intact bilaterally***      I have reviewed labs in epic and the results pertinent to this consultation are: ***  I have reviewed the images obtained:***  Impression: ***  Recommendations: 1) ***   Ritta Slot,  MD [facility] [phone]  If 7pm- 7am, please page neurology on call as listed in [facility].

## 2022-05-28 NOTE — ED Provider Notes (Signed)
11:25 PM Assumed care from Dr. Particia Nearing, please see their note for full history, physical and decision making until this point. In brief this is a 76 y.o. year old female who presented to the ED tonight with Seizures     Likely seizure. Pending MRI to r/o stroke, if ok increase Vimpat and d/c. Neuro aware, consult note in.   MRI ok. Granddaughter at bedside and feels patient is at baseline. Patient upset that I woke her up but otherwise feels well and normal at this time.   Discharge instructions, including strict return precautions for new or worsening symptoms, given. Patient and/or family verbalized understanding and agreement with the plan as described.   Labs, studies and imaging reviewed by myself and considered in medical decision making if ordered. Imaging interpreted by radiology.  Labs Reviewed  CBG MONITORING, ED - Abnormal; Notable for the following components:      Result Value   Glucose-Capillary 114 (*)    All other components within normal limits  RESP PANEL BY RT-PCR (FLU A&B, COVID) ARPGX2  ETHANOL  PROTIME-INR  APTT  CBC  DIFFERENTIAL  COMPREHENSIVE METABOLIC PANEL  RAPID URINE DRUG SCREEN, HOSP PERFORMED  URINALYSIS, ROUTINE W REFLEX MICROSCOPIC  I-STAT CHEM 8, ED  TROPONIN I (HIGH SENSITIVITY)  TROPONIN I (HIGH SENSITIVITY)    CT HEAD CODE STROKE WO CONTRAST  Final Result    MR BRAIN WO CONTRAST    (Results Pending)  MR ANGIO HEAD WO CONTRAST    (Results Pending)  MR ANGIO NECK WO CONTRAST    (Results Pending)    No follow-ups on file.    Warren Lindahl, Barbara Cower, MD 05/29/22 [phone]

## 2022-05-28 NOTE — ED Notes (Signed)
IV team at bedside to place USGIV for patient. Per neurologist at bedside ok to obtain IV access prior to going to CT scan.

## 2022-05-28 NOTE — ED Triage Notes (Signed)
Patient arrives via EMS from home due to seizure like activity. Per daughter patient had been complaining of a headache and dizziness around 530 PM. EMS was called to home due to patient having seizure like activity and jerking movements throughout her body.

## 2022-05-28 NOTE — ED Provider Notes (Signed)
[facility] EMERGENCY DEPARTMENT Provider Note   CSN: [id number] Arrival date & time: 05/28/22  2116     History  Chief Complaint  Patient presents with   Seizures    [name] is a 76 y.o. female.  Pt is a 76 yo female with pmhx significant for cva, high cholesterol, dm, htn, seizures.  Pt's daughter said she left pt around 5 pm and she was fine.  Some other family members were with her and said she was walking around and normal at 1945.  The daughter came back around 2000 and found pt on the ground confused.  She could not get up.  She seemed to have weakness to her left side.  She called EMS.  EMS reports some spasms.  Pt's daughter said pt did c/o a headache and dizziness.  Pt is unable to give me any hx.         Home Medications Prior to Admission medications   Medication Sig Start Date End Date Taking? Authorizing Provider  acetaminophen (TYLENOL) 325 MG tablet Take 650 mg by mouth every 6 (six) hours as needed for pain or moderate pain.    [provider]  aspirin 81 MG chewable tablet Chew 81 mg by mouth in the morning.    [provider]  atenolol (TENORMIN) 25 MG tablet Take 1 tablet (25 mg total) by mouth daily. 12/17/21 12/17/22  Anders Simmonds, PA-C  atorvastatin (LIPITOR) 40 MG tablet Take 1 tablet (40 mg total) by mouth daily. 02/20/22 02/20/23  Patwardhan, Anabel Bene, MD  Blood Glucose Monitoring Suppl (ACCU-CHEK AVIVA) device Use as instructed daily. 01/09/20   Hoy Register, MD  clopidogrel (PLAVIX) 75 MG tablet Take 1 tablet by mouth once daily. 05/04/22   Hoy Register, MD  diclofenac Sodium (VOLTAREN) 1 % GEL Apply 4 g topically 4 (four) times daily as needed (arthritis pain). 11/06/21   Glade Lloyd, MD  folic acid (FOLVITE) 1 MG tablet Take 1 tablet (1 mg total) by mouth daily. 11/06/21   Glade Lloyd, MD  glucose blood (TRUE METRIX BLOOD GLUCOSE TEST) test strip Use as instructed 01/09/20   Hoy Register, MD   Lacosamide 100 MG TABS Take 1 tablet by mouth in the morning and at bedtime Patient taking differently: Take 100 mg by mouth in the morning and at bedtime. 10/20/21   Windell Norfolk, MD  Lancets (ACCU-CHEK MULTICLIX) lancets Use as instructed 01/09/20   Hoy Register, MD  lisinopril (ZESTRIL) 10 MG tablet TAKE 1 TABLET (10 MG TOTAL) BY MOUTH DAILY. Patient taking differently: Take 10 mg by mouth daily. 12/17/21 12/17/22  Anders Simmonds, PA-C  metFORMIN (GLUCOPHAGE) 500 MG tablet TAKE 2 TABLETS (1,000 MG TOTAL) BY MOUTH 2 (TWO) TIMES DAILY WITH A MEAL. (Patient will need to make an office visit for next refill) 04/27/22 04/27/23  Claiborne Rigg, NP  nitroGLYCERIN (NITROSTAT) 0.4 MG SL tablet Place 1 tablet (0.4 mg total) under the tongue every 5 (five) minutes as needed for chest pain. 03/09/22 06/07/22  Cantwell, Celeste C, PA-C  vitamin B-12 (CYANOCOBALAMIN) 1000 MCG tablet Take 1 tablet (1,000 mcg total) by mouth daily. 11/06/21   Glade Lloyd, MD      Allergies    Penicillins and Sulfa antibiotics    Review of Systems   Review of Systems  Unable to perform ROS: Mental status change  All other systems reviewed and are negative.   Physical Exam Updated Vital Signs BP (!) 155/80  Pulse 68   Temp (!) 97.4 F (36.3 C) (Oral)   Resp 20   LMP 09/28/2014 (Approximate)   SpO2 100%  Physical Exam Vitals and nursing note reviewed.  Constitutional:      Appearance: Normal appearance.  HENT:     Head: Normocephalic and atraumatic.     Right Ear: External ear normal.     Left Ear: External ear normal.     Nose: Nose normal.     Mouth/Throat:     Mouth: Mucous membranes are moist.     Pharynx: Oropharynx is clear.  Eyes:     Extraocular Movements: Extraocular movements intact.     Conjunctiva/sclera: Conjunctivae normal.     Pupils: Pupils are equal, round, and reactive to light.  Cardiovascular:     Rate and Rhythm: Normal rate and regular rhythm.     Pulses: Normal pulses.      Heart sounds: Normal heart sounds.  Pulmonary:     Effort: Pulmonary effort is normal.     Breath sounds: Normal breath sounds.  Abdominal:     General: Abdomen is flat. Bowel sounds are normal.     Palpations: Abdomen is soft.  Musculoskeletal:     Cervical back: Normal range of motion and neck supple.  Skin:    General: Skin is warm.     Capillary Refill: Capillary refill takes less than 2 seconds.  Neurological:     Mental Status: She is alert.     Comments: Pt has some left sided weakness.  Her speech is slurred.  Unable to assess vision.     ED Results / Procedures / Treatments   Labs (all labs ordered are listed, but only abnormal results are displayed) Labs Reviewed  CBG MONITORING, ED - Abnormal; Notable for the following components:      Result Value   Glucose-Capillary 114 (*)    All other components within normal limits  RESP PANEL BY RT-PCR (FLU A&B, COVID) ARPGX2  ETHANOL  PROTIME-INR  APTT  CBC  DIFFERENTIAL  COMPREHENSIVE METABOLIC PANEL  RAPID URINE DRUG SCREEN, HOSP PERFORMED  URINALYSIS, ROUTINE W REFLEX MICROSCOPIC  I-STAT CHEM 8, ED  TROPONIN I (HIGH SENSITIVITY)    EKG EKG Interpretation  Date/Time:  Friday May 28 2022 21:23:05 EDT Ventricular Rate:  59 PR Interval:  179 QRS Duration: 113 QT Interval:  428 QTC Calculation: 424 R Axis:   74 Text Interpretation: Sinus arrhythmia Borderline intraventricular conduction delay Low voltage, precordial leads No significant change since last tracing Confirmed by Jacalyn Lefevre [phone]) on 05/28/2022 9:55:21 PM  Radiology CT HEAD CODE STROKE WO CONTRAST  Result Date: 05/28/2022 CLINICAL DATA:  Code stroke.  Left-sided weakness EXAM: CT HEAD WITHOUT CONTRAST TECHNIQUE: Contiguous axial images were obtained from the base of the skull through the vertex without intravenous contrast. RADIATION DOSE REDUCTION: This exam was performed according to the departmental dose-optimization program which includes  automated exposure control, adjustment of the mA and/or kV according to patient size and/or use of iterative reconstruction technique. COMPARISON:  None Available. FINDINGS: Brain: There is no mass, hemorrhage or extra-axial collection. There is generalized atrophy without lobar predilection. There is hypoattenuation of the periventricular white matter, most commonly indicating chronic ischemic microangiopathy. There are old infarcts of left frontal lobe and left basal ganglia. Vascular: Atherosclerotic calcification of the internal carotid arteries at the skull base. No abnormal hyperdensity of the major intracranial arteries or dural venous sinuses. Skull: The visualized skull base, calvarium and extracranial soft tissues  are normal. Sinuses/Orbits: No fluid levels or advanced mucosal thickening of the visualized paranasal sinuses. No mastoid or middle ear effusion. The orbits are normal. ASPECTS [facility] Stroke Program Early CT Score) - Ganglionic level infarction (caudate, lentiform nuclei, internal capsule, insula, M1-M3 cortex): 7 - Supraganglionic infarction (M4-M6 cortex): 3 Total score (0-10 with 10 being normal): 10 IMPRESSION: 1. No acute intracranial abnormality. 2. ASPECTS is 10. These results were called by telephone at the time of interpretation on 05/28/2022 at 10:28 pm to provider Ritta Slot, who verbally acknowledged these results. Electronically Signed   By: Deatra Robinson M.D.   On: 05/28/2022 22:29    Procedures Procedures    Medications Ordered in ED Medications  sodium chloride 0.9 % bolus 500 mL (has no administration in time range)    Followed by  0.9 %  sodium chloride infusion (has no administration in time range)  LORazepam (ATIVAN) injection 0.5 mg (has no administration in time range)     ED Course/ Medical Decision Making/ A&P                           Medical Decision Making Amount and/or Complexity of Data Reviewed Labs: ordered. Radiology:  ordered.  Risk Prescription drug management.   This patient presents to the ED for concern of ams, this involves an extensive number of treatment options, and is a complaint that carries with it a high risk of complications and morbidity.  The differential diagnosis includes seizure, cva, electrolyte abn   Co morbidities that complicate the patient evaluation  cva, high cholesterol, dm, htn, seizures   Additional history obtained:  Additional history obtained from epic chart review External records from outside source obtained and reviewed including EMS report, daughter   Lab Tests:  I Ordered labs.  They are pending at shift change.   Imaging Studies ordered:  I ordered imaging studies including ct head /mri/mra I independently visualized and interpreted imaging which showed  CT head: IMPRESSION:  1. No acute intracranial abnormality.  2. ASPECTS is 10.   I agree with the radiologist interpretation   Cardiac Monitoring:  The patient was maintained on a cardiac monitor.  I personally viewed and interpreted the cardiac monitored which showed an underlying rhythm of: nsr   Medicines ordered and prescription drug management:  I ordered medication including ativan  for seizure  Reevaluation of the patient after these medicines showed that the patient improved I have reviewed the patients home medicines and have made adjustments as needed   Test Considered:  mri   Critical Interventions:  Code stroke   Consultations Obtained:  I requested consultation with the neurologist (Dr. Amada Jupiter),  and discussed lab and imaging findings as well as pertinent plan -he recommends a MRI/MRA.  If MRI/MRA negative, pt will need vimpat increased to 150 mg bid.  Pt also needs to hold the tramadol.   Problem List / ED Course:  AMS:  CVA vs seizure:  If MRI +, pt will need admission.  If negative, vimpat will need to be increased to 150 mg bid. Tramadol  discontinued.   Reevaluation:  After the interventions noted above, I reevaluated the patient and found that they have :improved   Social Determinants of Health:  Lives at home   Dispostion:  Pending MRI.  Pt signed out at shift change.        Final Clinical Impression(s) / ED Diagnoses Final diagnoses:  Seizure-like activity (HCC)  Rx / DC Orders ED Discharge Orders     None         Jacalyn Lefevre, MD 05/31/22 [phone]

## 2022-05-28 NOTE — ED Notes (Signed)
This RN attempted x2 for PIV and was unsuccessful due to patient being a difficult stick. Riki Rusk RN at bedside to attempt to place USGIV for patient at this time.

## 2022-05-28 NOTE — ED Notes (Signed)
Neurologist at  bedside to evaluate patient at this time.

## 2022-05-28 NOTE — ED Notes (Signed)
This RN brought patient back from CT scan and upon arrival to room patient's daughter was at bedside. This RN began placing patient back on monitor and patient's daughter then became verbally aggressive stating that the curtain to the room needed to be closed due to patient having no privacy. This RN then apologized and stated that the priority at the time was to have the patient placed back on the monitor. This RN then closed the curtain and verbalized to patient's daughter that the patient would be cleaned up and changed in just a moment. Daughter at bedside then became verbally aggressive with this RN stating "You have no respect or dignity for my mother do your job." This RN verbalized to daughter that if her behavior continued then she would be asked to leave the department. Daughter then told this RN to leave the room. Hospital security called at this time to have daughter escorted out. Charge RN made aware of same.

## 2022-05-29 LAB — URINALYSIS, ROUTINE W REFLEX MICROSCOPIC
Bilirubin Urine: NEGATIVE
Glucose, UA: NEGATIVE mg/dL
Hgb urine dipstick: NEGATIVE
Ketones, ur: NEGATIVE mg/dL
Nitrite: NEGATIVE
Protein, ur: NEGATIVE mg/dL
Specific Gravity, Urine: 1.005 (ref 1.005–1.030)
pH: 7 (ref 5.0–8.0)

## 2022-05-29 LAB — RAPID URINE DRUG SCREEN, HOSP PERFORMED
Amphetamines: NOT DETECTED
Barbiturates: NOT DETECTED
Benzodiazepines: NOT DETECTED
Cocaine: NOT DETECTED
Opiates: NOT DETECTED
Tetrahydrocannabinol: NOT DETECTED

## 2022-05-29 LAB — I-STAT CHEM 8, ED
BUN: 12 mg/dL (ref 8–23)
Calcium, Ion: 1.19 mmol/L (ref 1.15–1.40)
Chloride: 101 mmol/L (ref 98–111)
Creatinine, Ser: 1.1 mg/dL — ABNORMAL HIGH (ref 0.44–1.00)
Glucose, Bld: 147 mg/dL — ABNORMAL HIGH (ref 70–99)
HCT: 37 % (ref 36.0–46.0)
Hemoglobin: 12.6 g/dL (ref 12.0–15.0)
Potassium: 3.9 mmol/L (ref 3.5–5.1)
Sodium: 143 mmol/L (ref 135–145)
TCO2: 29 mmol/L (ref 22–32)

## 2022-05-29 LAB — PROTIME-INR
INR: 1 (ref 0.8–1.2)
Prothrombin Time: 13.1 seconds (ref 11.4–15.2)

## 2022-05-29 LAB — COMPREHENSIVE METABOLIC PANEL
ALT: 11 U/L (ref 0–44)
AST: 18 U/L (ref 15–41)
Albumin: 3.4 g/dL — ABNORMAL LOW (ref 3.5–5.0)
Alkaline Phosphatase: 56 U/L (ref 38–126)
Anion gap: 10 (ref 5–15)
BUN: 13 mg/dL (ref 8–23)
CO2: 27 mmol/L (ref 22–32)
Calcium: 9 mg/dL (ref 8.9–10.3)
Chloride: 105 mmol/L (ref 98–111)
Creatinine, Ser: 1.15 mg/dL — ABNORMAL HIGH (ref 0.44–1.00)
GFR, Estimated: 50 mL/min — ABNORMAL LOW (ref >60)
Glucose, Bld: 152 mg/dL — ABNORMAL HIGH (ref 70–99)
Potassium: 4.1 mmol/L (ref 3.5–5.1)
Sodium: 142 mmol/L (ref 135–145)
Total Bilirubin: 0.5 mg/dL (ref 0.3–1.2)
Total Protein: 6.1 g/dL — ABNORMAL LOW (ref 6.5–8.1)

## 2022-05-29 LAB — ETHANOL: Alcohol, Ethyl (B): <10 mg/dL (ref <10)

## 2022-05-29 LAB — TROPONIN I (HIGH SENSITIVITY): Troponin I (High Sensitivity): 13 ng/L (ref <18)

## 2022-05-29 LAB — APTT: aPTT: 25 seconds (ref 24–36)

## 2022-05-29 MED ORDER — LACOSAMIDE 100 MG PO TABS
150.0000 mg | ORAL_TABLET | Freq: Two times a day (BID) | ORAL | 4 refills | Status: DC
  Filled 2022-05-29: qty 90, 30d supply, fill #0
  Filled 2022-08-06: qty 90, 30d supply, fill #1
  Filled 2022-10-04: qty 90, 30d supply, fill #2

## 2022-05-29 NOTE — Code Documentation (Signed)
Stroke Response Nurse Documentation Code Documentation  [name] is a 76 y.o. female arriving to [facility]  via [facility] on 7/7 with past medical hx of HTN, CVA, SDH, Seizures, DM. On aspirin 81 mg daily. Code stroke was activated by ED.   Patient from home where she was LKW at 1730 and now complaining of seizure like activity and left sided weakness .   Stroke team at the bedside on patient arrival. Labs drawn and patient cleared for CT by Dr. Particia Nearing. Patient to CT with team. NIHSS 6, see documentation for details and code stroke times. Patient with disoriented, left arm weakness, bilateral leg weakness, and Expressive aphasia  on exam. The following imaging was completed:  CT Head and MRI. Patient is not a candidate for IV Thrombolytic due to out of window. Patient is not a candidate for IR due to No LVO symptoms.   Care Plan: MRI.   Bedside handoff with ED RN Amy.    Rose Fillers  Rapid Response RN

## 2022-05-29 NOTE — ED Notes (Signed)
Patient transported to MRI 

## 2022-05-29 NOTE — ED Notes (Signed)
All discharge instructions including follow up care and prescriptions reviewed with patient and family at bedside. Patient stable at time of discharge.

## 2022-05-31 ENCOUNTER — Other Ambulatory Visit: Payer: Self-pay

## 2022-06-01 ENCOUNTER — Other Ambulatory Visit: Payer: Self-pay

## 2022-06-03 ENCOUNTER — Other Ambulatory Visit: Payer: Self-pay

## 2022-06-04 ENCOUNTER — Ambulatory Visit: Payer: Medicare Other | Admitting: Student

## 2022-06-21 ENCOUNTER — Encounter (HOSPITAL_COMMUNITY): Payer: Self-pay

## 2022-07-05 ENCOUNTER — Other Ambulatory Visit: Payer: Self-pay

## 2022-07-06 ENCOUNTER — Telehealth: Payer: Self-pay

## 2022-07-06 NOTE — Telephone Encounter (Signed)
Pt was called and VM was left informing patient to return call. Pt is needing to schedule a AWV over the phone.

## 2022-07-09 ENCOUNTER — Other Ambulatory Visit: Payer: Self-pay

## 2022-07-12 ENCOUNTER — Encounter: Payer: Self-pay | Admitting: Psychology

## 2022-07-13 ENCOUNTER — Other Ambulatory Visit: Payer: Self-pay

## 2022-07-13 ENCOUNTER — Ambulatory Visit: Payer: Medicare Other | Admitting: Psychology

## 2022-07-13 ENCOUNTER — Ambulatory Visit (INDEPENDENT_AMBULATORY_CARE_PROVIDER_SITE_OTHER): Payer: Medicare Other | Admitting: Psychology

## 2022-07-13 ENCOUNTER — Encounter: Payer: Self-pay | Admitting: Psychology

## 2022-07-13 DIAGNOSIS — F01B0 Vascular dementia, moderate, without behavioral disturbance, psychotic disturbance, mood disturbance, and anxiety: Secondary | ICD-10-CM

## 2022-07-13 DIAGNOSIS — R4189 Other symptoms and signs involving cognitive functions and awareness: Secondary | ICD-10-CM

## 2022-07-13 DIAGNOSIS — R569 Unspecified convulsions: Secondary | ICD-10-CM

## 2022-07-13 DIAGNOSIS — F0150 Vascular dementia without behavioral disturbance: Secondary | ICD-10-CM | POA: Insufficient documentation

## 2022-07-13 DIAGNOSIS — I639 Cerebral infarction, unspecified: Secondary | ICD-10-CM

## 2022-07-13 HISTORY — DX: Vascular dementia, unspecified severity, without behavioral disturbance, psychotic disturbance, mood disturbance, and anxiety: F01.50
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
## 2022-07-13 NOTE — Progress Notes (Signed)
   Psychometrician Note   Cognitive testing was administered to [name] by Wallace Keller, B.S. (psychometrist) under the supervision of Dr. Newman Nickels, Ph.D., licensed psychologist on 07/13/2022. Ms. Dinsmore did not appear overtly distressed by the testing session per behavioral observation or responses across self-report questionnaires. Rest breaks were offered.    The battery of tests administered was selected by Dr. Newman Nickels, Ph.D. with consideration to Ms. Enriquez's current level of functioning, the nature of her symptoms, emotional and behavioral responses during interview, level of literacy, observed level of motivation/effort, and the nature of the referral question. This battery was communicated to the psychometrist. Communication between Dr. Newman Nickels, Ph.D. and the psychometrist was ongoing throughout the evaluation and Dr. Newman Nickels, Ph.D. was immediately accessible at all times. Dr. Newman Nickels, Ph.D. provided supervision to the psychometrist on the date of this service to the extent necessary to assure the quality of all services provided.    [name] will return within approximately 1-2 weeks for an interactive feedback session with Dr. Milbert Coulter at which time her test performances, clinical impressions, and treatment recommendations will be reviewed in detail. Ms. Gent understands she can contact our office should she require our assistance before this time.  A total of 85 minutes of billable time were spent face-to-face with Ms. Crandle by the psychometrist. This includes both test administration and scoring time. Billing for these services is reflected in the clinical report generated by Dr. Newman Nickels, Ph.D.  This note reflects time spent with the psychometrician and does not include test scores or any clinical interpretations made by Dr. Milbert Coulter. The full report will follow in a separate note.

## 2022-07-14 ENCOUNTER — Encounter: Payer: Self-pay | Admitting: Psychology

## 2022-07-19 ENCOUNTER — Ambulatory Visit (INDEPENDENT_AMBULATORY_CARE_PROVIDER_SITE_OTHER): Payer: Medicare Other | Admitting: Psychology

## 2022-07-19 ENCOUNTER — Other Ambulatory Visit: Payer: Self-pay

## 2022-07-19 DIAGNOSIS — I639 Cerebral infarction, unspecified: Secondary | ICD-10-CM | POA: Diagnosis not present

## 2022-07-19 DIAGNOSIS — F01B0 Vascular dementia, moderate, without behavioral disturbance, psychotic disturbance, mood disturbance, and anxiety: Secondary | ICD-10-CM | POA: Diagnosis not present

## 2022-07-19 NOTE — Progress Notes (Signed)
   Neuropsychology Feedback Session [name]. [facility] Department of Neurology  Reason for Referral:   [name] is a 76 y.o. right-handed African-American female referred by  [name], NP , to characterize her current cognitive functioning and assist with diagnostic clarity and treatment planning in the context of a significant stroke history and several medical and neurological comorbidities.   Feedback:   [name] completed a comprehensive neuropsychological evaluation on 07/13/2022. Please refer to that encounter for the full report and recommendations. Behaviorally, [name] exhibited significant symptoms of aphasia, making ongoing communication challenging and limiting test selection to nonverbal instruments. Unfortunately, she also exhibited significant impairment surrounding receptive language (i.e., the ability to effectively understand what is being communicated to her). These impairments created many instances throughout testing where tasks had to be discontinued as she was unable to adequately understand what she was being asked to perform. This creates concerns surrounding the ongoing validity of testing that was completed given that otherwise diffuse and severe impairment is certainly influenced by ongoing receptive language impairments. Based upon medical history and ongoing day-to-day functionality, a diagnosis of a vascular dementia presentation would appear appropriate. While there are validity concerns surrounding ongoing testing, [name] has a very extensive stroke history, with cerebral infarcts affecting nearly all major brain areas. This would certainly create the expectation of diffuse impairment. Large strokes specific to the left MCA region would certainly create deficits with expressive and receptive language. Her subdural hematoma history could certainly complicate her clinical presentation. However, as this occurred over 10  years ago, there is no way to determine what degree of impairment is related to this event rather than her stroke history. Seizures are common in individuals after large infarcts (and brain bleeds) and these events would most likely be due to her vascular history. They could further worsen cognitive dysfunction depending on their overall frequency.   [name] was accompanied by her daughter during the current feedback session. Content of the current session focused on the results of her neuropsychological evaluation. [name] was given the opportunity to ask questions and her questions were answered. She was encouraged to reach out should additional questions arise. A copy of her report was provided at the conclusion of the visit.      25 minutes were spent conducting the current feedback session with [name], billed as one unit [phone].

## 2022-08-06 ENCOUNTER — Other Ambulatory Visit: Payer: Self-pay | Admitting: Cardiology

## 2022-08-06 ENCOUNTER — Other Ambulatory Visit: Payer: Self-pay | Admitting: Physician Assistant

## 2022-08-06 ENCOUNTER — Other Ambulatory Visit: Payer: Self-pay

## 2022-08-06 ENCOUNTER — Other Ambulatory Visit: Payer: Self-pay | Admitting: Nurse Practitioner

## 2022-08-06 DIAGNOSIS — E1169 Type 2 diabetes mellitus with other specified complication: Secondary | ICD-10-CM

## 2022-08-06 DIAGNOSIS — Z8673 Personal history of transient ischemic attack (TIA), and cerebral infarction without residual deficits: Secondary | ICD-10-CM

## 2022-08-06 DIAGNOSIS — I10 Essential (primary) hypertension: Secondary | ICD-10-CM

## 2022-08-06 MED ORDER — ATORVASTATIN CALCIUM 40 MG PO TABS
40.0000 mg | ORAL_TABLET | Freq: Every day | ORAL | 1 refills | Status: DC
  Filled 2022-08-06: qty 90, 90d supply, fill #0

## 2022-08-06 MED ORDER — ATENOLOL 25 MG PO TABS
25.0000 mg | ORAL_TABLET | Freq: Every day | ORAL | 0 refills | Status: DC
  Filled 2022-08-06: qty 30, 30d supply, fill #0

## 2022-08-06 MED ORDER — METFORMIN HCL 500 MG PO TABS
1000.0000 mg | ORAL_TABLET | Freq: Two times a day (BID) | ORAL | 0 refills | Status: DC
  Filled 2022-08-06: qty 120, 30d supply, fill #0

## 2022-08-09 ENCOUNTER — Telehealth (HOSPITAL_COMMUNITY): Payer: Self-pay

## 2022-08-09 NOTE — Telephone Encounter (Signed)
No response from pt regarding CR.  Closed referral.  

## 2022-08-12 ENCOUNTER — Other Ambulatory Visit: Payer: Self-pay

## 2022-08-12 ENCOUNTER — Encounter: Payer: Self-pay | Admitting: Family Medicine

## 2022-08-12 ENCOUNTER — Ambulatory Visit: Payer: Medicare Other | Attending: Family Medicine | Admitting: Family Medicine

## 2022-08-12 VITALS — BP 108/73 | HR 98 | Temp 97.9°F | Ht 63.0 in | Wt 178.6 lb

## 2022-08-12 DIAGNOSIS — E1169 Type 2 diabetes mellitus with other specified complication: Secondary | ICD-10-CM | POA: Diagnosis not present

## 2022-08-12 DIAGNOSIS — I2121 ST elevation (STEMI) myocardial infarction involving left circumflex coronary artery: Secondary | ICD-10-CM

## 2022-08-12 DIAGNOSIS — R569 Unspecified convulsions: Secondary | ICD-10-CM

## 2022-08-12 DIAGNOSIS — E119 Type 2 diabetes mellitus without complications: Secondary | ICD-10-CM | POA: Diagnosis present

## 2022-08-12 DIAGNOSIS — I252 Old myocardial infarction: Secondary | ICD-10-CM | POA: Diagnosis not present

## 2022-08-12 DIAGNOSIS — Z8673 Personal history of transient ischemic attack (TIA), and cerebral infarction without residual deficits: Secondary | ICD-10-CM | POA: Diagnosis not present

## 2022-08-12 DIAGNOSIS — E1159 Type 2 diabetes mellitus with other circulatory complications: Secondary | ICD-10-CM | POA: Diagnosis not present

## 2022-08-12 DIAGNOSIS — F01B0 Vascular dementia, moderate, without behavioral disturbance, psychotic disturbance, mood disturbance, and anxiety: Secondary | ICD-10-CM

## 2022-08-12 DIAGNOSIS — I10 Essential (primary) hypertension: Secondary | ICD-10-CM | POA: Diagnosis not present

## 2022-08-12 DIAGNOSIS — Z7902 Long term (current) use of antithrombotics/antiplatelets: Secondary | ICD-10-CM | POA: Insufficient documentation

## 2022-08-12 DIAGNOSIS — Z1211 Encounter for screening for malignant neoplasm of colon: Secondary | ICD-10-CM

## 2022-08-12 DIAGNOSIS — F0150 Vascular dementia without behavioral disturbance: Secondary | ICD-10-CM | POA: Diagnosis not present

## 2022-08-12 DIAGNOSIS — I152 Hypertension secondary to endocrine disorders: Secondary | ICD-10-CM

## 2022-08-12 LAB — POCT GLYCOSYLATED HEMOGLOBIN (HGB A1C): HbA1c, POC (controlled diabetic range): 6.9 % (ref 0.0–7.0)

## 2022-08-12 LAB — GLUCOSE, POCT (MANUAL RESULT ENTRY): POC Glucose: 172 mg/dl — AB (ref 70–99)

## 2022-08-12 MED ORDER — CLOPIDOGREL BISULFATE 75 MG PO TABS
75.0000 mg | ORAL_TABLET | Freq: Every day | ORAL | 1 refills | Status: DC
  Filled 2022-08-12: qty 90, fill #0
  Filled 2022-09-07: qty 90, 90d supply, fill #0
  Filled 2022-12-10: qty 90, 90d supply, fill #1

## 2022-08-12 MED ORDER — LISINOPRIL 10 MG PO TABS
10.0000 mg | ORAL_TABLET | Freq: Every day | ORAL | 1 refills | Status: DC
  Filled 2022-08-12: qty 90, 90d supply, fill #0
  Filled 2022-09-14: qty 30, 30d supply, fill #0
  Filled 2022-10-20: qty 30, 30d supply, fill #1
  Filled 2022-11-19: qty 30, 30d supply, fill #2
  Filled 2022-12-27: qty 30, 30d supply, fill #3
  Filled 2023-01-24: qty 30, 30d supply, fill #4

## 2022-08-12 MED ORDER — METFORMIN HCL 500 MG PO TABS
1000.0000 mg | ORAL_TABLET | Freq: Two times a day (BID) | ORAL | 1 refills | Status: DC
  Filled 2022-08-12 – 2022-09-07 (×2): qty 360, 90d supply, fill #0
  Filled 2022-12-10: qty 360, 90d supply, fill #1

## 2022-08-12 MED ORDER — ATORVASTATIN CALCIUM 40 MG PO TABS
40.0000 mg | ORAL_TABLET | Freq: Every day | ORAL | 1 refills | Status: DC
  Filled 2022-08-12: qty 90, 90d supply, fill #0
  Filled 2022-09-14: qty 30, 30d supply, fill #0
  Filled 2022-10-13 – 2022-10-20 (×2): qty 30, 30d supply, fill #1
  Filled 2022-11-19: qty 30, 30d supply, fill #2
  Filled 2022-12-27: qty 30, 30d supply, fill #3
  Filled 2023-02-04: qty 30, 30d supply, fill #4

## 2022-08-12 MED ORDER — ATENOLOL 25 MG PO TABS
25.0000 mg | ORAL_TABLET | Freq: Every day | ORAL | 1 refills | Status: DC
  Filled 2022-08-12: qty 90, 90d supply, fill #0
  Filled 2022-11-19: qty 30, 30d supply, fill #1
  Filled 2022-12-27: qty 30, 30d supply, fill #2
  Filled 2023-01-24: qty 30, 30d supply, fill #3

## 2022-08-12 NOTE — Progress Notes (Signed)
Subjective:  Patient ID: [name], female    DOB: Jun 20, 1946  Age: 76 y.o. MRN: [id number]  CC: Diabetes (Diabetes f/u. )   HPI [name] is a 76 y.o. year old female with a history of type 2 diabetes mellitus (6.9), hypertension, previous multiple CVAs with residual aphasia, CAD (status post PCI to mid LCx in 01/2022), seizures, vascular dementia  Interval History: Visit with [facility] was in 06/2022 and she has completed a neuropsychological evaluation with findings in keeping with vascular dementia.  Seen by cardiology in 05/2022 with recommendation to follow-up in 6 months.  She has no chest pain or dyspnea. She is on Lacosamide but daughter feels it is causing more speech and gait impairment, dizziness.Daughter states she had to decrease her dose of Lacosamide due to her decreased function and patient is doing better. She has some comprehension diffculties as well as expressive aphasia.  She has difficulties with her vision, status post cataract surgery.  She uses glasses but does not feel she can see clearly.  With regards to her diabetes mellitus she is doing well on metformin Also adherent with her antihypertensive and her statin. Remains on Plavix due to history of multiple strokes.  Past Medical History:  Diagnosis Date   AKI (acute kidney injury) 11/05/2021   AMS (altered mental status) 03/12/2021   Facial weakness 07/08/2014   Hypertension    Left leg weakness 07/08/2014   Osteoarthritis of hip 06/18/2021   Seizure 11/05/2021   STEMI (ST elevation myocardial infarction) 02/19/2022   Stroke    large left frontal lobe infarct with associated encephalomalacia. Additional redemonstrated infarcts in the right occipital lobe, left parietal lobe, posterior left temporal lobe, right cerebellum, and left greater than right basal ganglia   Subdural hemorrhage 07/08/2014   Type II diabetes mellitus 07/09/2014   Vascular dementia 07/13/2022    Past  Surgical History:  Procedure Laterality Date   CORONARY STENT INTERVENTION N/A 02/19/2022   Procedure: CORONARY STENT INTERVENTION;  Surgeon: Elder Negus, MD;  Location: [facility];  Service: Cardiovascular;  Laterality: N/A;   CORONARY/GRAFT ACUTE MI REVASCULARIZATION N/A 02/19/2022   Procedure: Coronary/Graft Acute MI Revascularization;  Surgeon: Elder Negus, MD;  Location: [facility];  Service: Cardiovascular;  Laterality: N/A;   LEFT HEART CATH AND CORONARY ANGIOGRAPHY N/A 02/19/2022   Procedure: LEFT HEART CATH AND CORONARY ANGIOGRAPHY;  Surgeon: Elder Negus, MD;  Location: [facility];  Service: Cardiovascular;  Laterality: N/A;   NO PAST SURGERIES      Family History  Problem Relation Age of Onset   Hyperlipidemia Mother    Hypertension Mother    Diabetes Mother    Stroke Father     Social History   Socioeconomic History   Marital status: Widowed    Spouse name: Not on file   Number of children: 3   Years of education: 12   Highest education level: High school graduate  Occupational History   Occupation: Retired  Tobacco Use   Smoking status: Former    Packs/day: 1.00    Years: 48.00    Total pack years: 48.00    Types: Cigarettes    Quit date: 2023    Years since quitting: 0.7   Smokeless tobacco: Never  Vaping Use   Vaping Use: Never used  Substance and Sexual Activity   Alcohol use: No   Drug use: No   Sexual activity: Not on file  Other Topics  Concern   Not on file  Social History Narrative   Lives with daughter, Steward DroneBrenda, and her grandchildren.   Right-handed.   Three cups caffeine daily.   Social Determinants of Health   Financial Resource Strain: Not on file  Food Insecurity: Not on file  Transportation Needs: Not on file  Physical Activity: Not on file  Stress: Not on file  Social Connections: Not on file    Allergies  Allergen Reactions   Penicillins Itching   Sulfa Antibiotics Other (See  Comments)    Reaction not recalled, but patient was told she was allergic    Outpatient Medications Prior to Visit  Medication Sig Dispense Refill   acetaminophen (TYLENOL) 325 MG tablet Take 650 mg by mouth every 6 (six) hours as needed for pain or moderate pain.     aspirin 81 MG chewable tablet Chew 81 mg by mouth in the morning.     Blood Glucose Monitoring Suppl (ACCU-CHEK AVIVA) device Use as instructed daily. 1 each 0   diclofenac Sodium (VOLTAREN) 1 % GEL Apply 4 g topically 4 (four) times daily as needed (arthritis pain).     folic acid (FOLVITE) 1 MG tablet Take 1 tablet (1 mg total) by mouth daily. 30 tablet 0   glucose blood (TRUE METRIX BLOOD GLUCOSE TEST) test strip Use as instructed 100 each 6   Lacosamide 100 MG TABS Take 1.5 tablets (150 mg total) by mouth 2 (two) times daily. 180 tablet 4   Lancets (ACCU-CHEK MULTICLIX) lancets Use as instructed 100 each 12   vitamin B-12 (CYANOCOBALAMIN) 1000 MCG tablet Take 1 tablet (1,000 mcg total) by mouth daily. 30 tablet 0   atenolol (TENORMIN) 25 MG tablet Take 1 tablet (25 mg total) by mouth daily. 30 tablet 0   atorvastatin (LIPITOR) 40 MG tablet Take 1 tablet (40 mg total) by mouth daily. 90 tablet 1   clopidogrel (PLAVIX) 75 MG tablet Take 1 tablet by mouth once daily. 90 tablet 1   lisinopril (ZESTRIL) 10 MG tablet TAKE 1 TABLET (10 MG TOTAL) BY MOUTH DAILY. (Patient taking differently: Take 10 mg by mouth daily.) 90 tablet 3   metFORMIN (GLUCOPHAGE) 500 MG tablet Take 2 tablets (1,000 mg total) by mouth 2 (two) times daily with a meal. (Patient will need to make an office visit for next refill) 120 tablet 0   nitroGLYCERIN (NITROSTAT) 0.4 MG SL tablet Place 1 tablet (0.4 mg total) under the tongue every 5 (five) minutes as needed for chest pain. 30 tablet 3   No facility-administered medications prior to visit.     ROS Review of Systems  Constitutional:  Negative for activity change and appetite change.  HENT:  Negative  for sinus pressure and sore throat.   Respiratory:  Negative for chest tightness, shortness of breath and wheezing.   Cardiovascular:  Negative for chest pain and palpitations.  Gastrointestinal:  Negative for abdominal distention, abdominal pain and constipation.  Genitourinary: Negative.   Musculoskeletal: Negative.   Psychiatric/Behavioral:  Negative for behavioral problems and dysphoric mood.     Objective:  BP 108/73 (BP Location: Left Arm, Patient Position: Sitting, Cuff Size: Large)   Pulse 98   Temp 97.9 F (36.6 C) (Oral)   Ht 5\' 3"  (1.6 m)   Wt 178 lb 9.6 oz (81 kg)   LMP 09/28/2014 (Approximate)   SpO2 99%   BMI 31.64 kg/m      08/12/2022    8:43 AM 05/29/2022    3:00 AM  05/29/2022    2:00 AM  BP/Weight  Systolic BP 154 008 676  Diastolic BP 73 78 56  Wt. (Lbs) 178.6    BMI 31.64 kg/m2        Physical Exam Constitutional:      Appearance: She is well-developed.  Cardiovascular:     Rate and Rhythm: Normal rate.     Heart sounds: Normal heart sounds. No murmur heard. Pulmonary:     Effort: Pulmonary effort is normal.     Breath sounds: Normal breath sounds. No wheezing or rales.  Chest:     Chest wall: No tenderness.  Abdominal:     General: Bowel sounds are normal. There is no distension.     Palpations: Abdomen is soft. There is no mass.     Tenderness: There is no abdominal tenderness.  Musculoskeletal:        General: Normal range of motion.     Right lower leg: No edema.     Left lower leg: No edema.  Neurological:     Mental Status: She is alert. Mental status is at baseline.     Comments: Expressive aphasia with word finding difficulty  Psychiatric:        Mood and Affect: Mood normal.    Diabetic Foot Exam - Simple   Simple Foot Form Diabetic Foot exam was performed with the following findings: Yes 08/12/2022  9:13 AM  Visual Inspection No deformities, no ulcerations, no other skin breakdown bilaterally: Yes Sensation Testing Intact to  touch and monofilament testing bilaterally: Yes Pulse Check Posterior Tibialis and Dorsalis pulse intact bilaterally: Yes Comments        Latest Ref Rng & Units 05/29/2022   12:20 AM 05/28/2022   11:38 PM 03/22/2022    7:00 PM  CMP  Glucose 70 - 99 mg/dL 147  152  118   BUN 8 - 23 mg/dL 12  13  17    Creatinine 0.44 - 1.00 mg/dL 1.10  1.15  1.10   Sodium 135 - 145 mmol/L 143  142  140   Potassium 3.5 - 5.1 mmol/L 3.9  4.1  4.7   Chloride 98 - 111 mmol/L 101  105  106   CO2 22 - 32 mmol/L  27  27   Calcium 8.9 - 10.3 mg/dL  9.0  9.0   Total Protein 6.5 - 8.1 g/dL  6.1    Total Bilirubin 0.3 - 1.2 mg/dL  0.5    Alkaline Phos 38 - 126 U/L  56    AST 15 - 41 U/L  18    ALT 0 - 44 U/L  11      Lipid Panel     Component Value Date/Time   CHOL 165 02/19/2022 0654   TRIG 148 02/19/2022 0654   HDL 51 02/19/2022 0654   CHOLHDL 3.2 02/19/2022 0654   VLDL 30 02/19/2022 0654   LDLCALC 84 02/19/2022 0654    CBC    Component Value Date/Time   WBC 9.2 05/28/2022 2338   RBC 4.56 05/28/2022 2338   HGB 12.6 05/29/2022 0020   HGB 13.9 01/01/2021 1647   HCT 37.0 05/29/2022 0020   HCT 42.2 01/01/2021 1647   PLT 290 05/28/2022 2338   PLT 331 01/01/2021 1647   MCV 85.7 05/28/2022 2338   MCV 84 01/01/2021 1647   MCH 26.3 05/28/2022 2338   MCHC 30.7 05/28/2022 2338   RDW 14.2 05/28/2022 2338   RDW 14.1 01/01/2021 1647   LYMPHSABS 2.7  05/28/2022 2338   LYMPHSABS 3.1 01/01/2021 1647   MONOABS 0.5 05/28/2022 2338   EOSABS 0.1 05/28/2022 2338   EOSABS 0.1 01/01/2021 1647   BASOSABS 0.0 05/28/2022 2338   BASOSABS 0.1 01/01/2021 1647    Lab Results  Component Value Date   HGBA1C 6.9 08/12/2022    Assessment & Plan:  1. Type 2 diabetes mellitus with other specified complication, without long-term current use of insulin (HCC) Controlled with A1c of 6.9 Continue current management Counseled on Diabetic diet, my plate method, 161 minutes of moderate intensity exercise/week Blood sugar  logs with fasting goals of 80-120 mg/dl, random of less than 096 and in the event of sugars less than 60 mg/dl or greater than 045 mg/dl encouraged to notify the clinic. Advised on the need for annual eye exams, annual foot exams, Pneumonia vaccine. - POCT glucose (manual entry) - POCT glycosylated hemoglobin (Hb A1C) - Microalbumin/Creatinine Ratio, Urine - Basic Metabolic Panel - Ambulatory referral to Ophthalmology - atorvastatin (LIPITOR) 40 MG tablet; Take 1 tablet (40 mg total) by mouth daily.  Dispense: 90 tablet; Refill: 1 - metFORMIN (GLUCOPHAGE) 500 MG tablet; Take 2 tablets (1,000 mg total) by mouth 2 (two) times daily with a meal. (Patient will need to make an office visit for next refill)  Dispense: 360 tablet; Refill: 1  2. Screening for colon cancer - Cologuard  3. H/O: CVA (cerebrovascular accident) With residual expressive aphasia and neurocognitive deficits Continue Plavix Risk factor modification - atorvastatin (LIPITOR) 40 MG tablet; Take 1 tablet (40 mg total) by mouth daily.  Dispense: 90 tablet; Refill: 1 - clopidogrel (PLAVIX) 75 MG tablet; Take 1 tablet by mouth once daily.  Dispense: 90 tablet; Refill: 1  4. Hypertension associated with diabetes (HCC) Controlled Counseled on blood pressure goal of less than 130/80, low-sodium, DASH diet, medication compliance, 150 minutes of moderate intensity exercise per week. Discussed medication compliance, adverse effects. - atenolol (TENORMIN) 25 MG tablet; Take 1 tablet (25 mg total) by mouth daily.  Dispense: 90 tablet; Refill: 1 - lisinopril (ZESTRIL) 10 MG tablet; Take 1 tablet (10 mg total) by mouth daily.  Dispense: 90 tablet; Refill: 1  5. ST elevation myocardial infarction involving left circumflex coronary artery (HCC) Status post PCI to LCx Asymptomatic Risk factor modification Follow-up with cardiology  6. Seizure Currently on Lacosamide Followed by neurology  7. Moderate vascular dementia without  behavioral disturbance, psychotic disturbance, mood disturbance, or anxiety (HCC) Secondary to previous stroke Controlled on activities to stimulate the brain Daughter is her major caregiver   Meds ordered this encounter  Medications   atorvastatin (LIPITOR) 40 MG tablet    Sig: Take 1 tablet (40 mg total) by mouth daily.    Dispense:  90 tablet    Refill:  1   atenolol (TENORMIN) 25 MG tablet    Sig: Take 1 tablet (25 mg total) by mouth daily.    Dispense:  90 tablet    Refill:  1   clopidogrel (PLAVIX) 75 MG tablet    Sig: Take 1 tablet by mouth once daily.    Dispense:  90 tablet    Refill:  1   lisinopril (ZESTRIL) 10 MG tablet    Sig: Take 1 tablet (10 mg total) by mouth daily.    Dispense:  90 tablet    Refill:  1   metFORMIN (GLUCOPHAGE) 500 MG tablet    Sig: Take 2 tablets (1,000 mg total) by mouth 2 (two) times daily with a meal. (Patient  will need to make an office visit for next refill)    Dispense:  360 tablet    Refill:  1    Follow-up: Return in about 6 months (around 02/10/2023) for Chronic medical conditions.       Hoy Register, MD, FAAFP. [facility] [place], [place] [phone]   08/12/2022, 10:06 AM

## 2022-08-12 NOTE — Patient Instructions (Signed)
Dementia Dementia is a condition that affects the way the brain functions. It often affects memory and thinking. Usually, dementia gets worse with time and cannot be reversed (progressive dementia). There are many types of dementia, including: Alzheimer's disease. This type is the most common. Vascular dementia. This type may happen as the result of a stroke. Lewy body dementia. This type may happen to people who have Parkinson's disease. Frontotemporal dementia. This type is caused by damage to nerve cells (neurons) in certain parts of the brain. Some people may be affected by more than one type of dementia. This is called mixed dementia. What are the causes? Dementia is caused by damage to cells in the brain. The area of the brain and the types of cells damaged determine the type of dementia. Usually, this damage is irreversible or cannot be undone. Some examples of irreversible causes include: Conditions that affect the blood vessels of the brain, such as diabetes, heart disease, or blood vessel disease. Genetic mutations. In some cases, changes in the brain may be caused by another condition and can be reversed or slowed. Some examples of reversible causes include: Injury to the brain. Certain medicines. Infection, such as meningitis. Metabolic problems, such as vitamin B12 deficiency or thyroid disease. Pressure on the brain, such as from a tumor, blood clot, or too much fluid in the brain (hydrocephalus). Autoimmune diseases that affect the brain or arteries, such as limbic encephalitis or vasculitis. What are the signs or symptoms? Symptoms of dementia depend on the type of dementia. Common signs of dementia include problems with remembering, thinking, problem solving, decision making, and communicating. These signs develop slowly or get worse with time. This may include: Problems remembering events or people. Having trouble taking a bath or putting clothes on. Forgetting appointments or  forgetting to pay bills. Difficulty planning and preparing meals. Having trouble speaking. Getting lost easily. Changes in behavior or mood. How is this diagnosed? This condition is diagnosed by a specialist (neurologist). It is diagnosed based on the history of your symptoms, your medical history, a physical exam, and tests. Tests may include: Tests to evaluate brain function, such as memory tests, cognitive tests, and other tests. Lab tests, such as blood or urine tests. Imaging tests, such as a CT scan, a PET scan, or an MRI. Genetic testing. This may be done if other family members have a diagnosis of certain types of dementia. Your health care provider will talk with you and your family, friends, or caregivers about your history and symptoms. How is this treated? Treatment for this condition depends on the cause of the dementia. Progressive dementias, such as Alzheimer's disease, cannot be cured, but there may be treatments that help to manage symptoms. Treatment might involve taking medicines that may help to: Control the dementia. Slow down the progression of the dementia. Manage symptoms. In some cases, treating the cause of your dementia can improve symptoms, reverse symptoms, or slow down how quickly your dementia becomes worse. Your health care provider can direct you to support groups, organizations, and other health care providers who can help with decisions about your care. Follow these instructions at home: Medicines Take over-the-counter and prescription medicines only as told by your health care provider. Use a pill organizer or pill reminder to help you manage your medicines. Avoid taking medicines that can affect thinking, such as pain medicines or sleeping medicines. Lifestyle Make healthy lifestyle choices. Be physically active as told by your health care provider. Do not use any   products that contain nicotine or tobacco, such as cigarettes, e-cigarettes, and chewing  tobacco. If you need help quitting, ask your health care provider. Do not drink alcohol. Practice stress-management techniques when you get stressed. Spend time with other people. Make sure to get quality sleep. These tips can help you get a good night's rest: Avoid napping during the day. Keep your sleeping area dark and cool. Avoid exercising during the few hours before you go to bed. Avoid caffeine products in the evening. Eating and drinking Drink enough fluid to keep your urine pale yellow. Eat a healthy diet. General instructions  Work with your health care provider to determine what you need help with and what your safety needs are. Talk with your health care provider about whether it is safe for you to drive. If you were given a bracelet that identifies you as a person with memory loss or tracks your location, make sure to wear it at all times. Work with your family to make important decisions, such as advance directives, medical power of attorney, or a living will. Keep all follow-up visits. This is important. Where to find more information Alzheimer's Association: www.alz.org National Institute on Aging: www.nia.nih.gov/alzheimers World Health Organization: www.who.int Contact a health care provider if: You have any new or worsening symptoms. You have problems with choking or swallowing. Get help right away if: You feel depressed or sad, or feel that you want to harm yourself. Your family members become concerned for your safety. If you ever feel like you may hurt yourself or others, or have thoughts about taking your own life, get help right away. Go to your nearest emergency department or: Call your local emergency services (911 in the [place] Call a suicide crisis helpline, such as the National Suicide Prevention Lifeline at 1-800-273-8255 or 988 in the [place] This is open 24 hours a day in the [place] Text the Crisis Text Line at 741741 (in the [place] Summary Dementia is a  condition that affects the way the brain functions. Dementia often affects memory and thinking. Usually, dementia gets worse with time and cannot be reversed (progressive dementia). Treatment for this condition depends on the cause of the dementia. Work with your health care provider to determine what you need help with and what your safety needs are. Your health care provider can direct you to support groups, organizations, and other health care providers who can help with decisions about your care. This information is not intended to replace advice given to you by your health care provider. Make sure you discuss any questions you have with your health care provider. Document Revised: 06/03/2021 Document Reviewed: 03/24/2020 Elsevier Patient Education  2023 [employer]  

## 2022-08-13 LAB — BASIC METABOLIC PANEL
BUN/Creatinine Ratio: 15 (ref 12–28)
BUN: 16 mg/dL (ref 8–27)
CO2: 22 mmol/L (ref 20–29)
Calcium: 9.5 mg/dL (ref 8.7–10.3)
Chloride: 103 mmol/L (ref 96–106)
Creatinine, Ser: 1.07 mg/dL — ABNORMAL HIGH (ref 0.57–1.00)
Glucose: 166 mg/dL — ABNORMAL HIGH (ref 70–99)
Potassium: 4.8 mmol/L (ref 3.5–5.2)
Sodium: 140 mmol/L (ref 134–144)
eGFR: 55 mL/min/{1.73_m2} — ABNORMAL LOW (ref >59)

## 2022-08-13 LAB — MICROALBUMIN / CREATININE URINE RATIO
Creatinine, Urine: 102.9 mg/dL
Microalb/Creat Ratio: 4 mg/g creat (ref 0–29)
Microalbumin, Urine: 3.8 ug/mL

## 2022-08-17 ENCOUNTER — Other Ambulatory Visit: Payer: Self-pay

## 2022-09-01 LAB — COLOGUARD: COLOGUARD: NEGATIVE

## 2022-09-07 ENCOUNTER — Other Ambulatory Visit: Payer: Self-pay

## 2022-09-08 ENCOUNTER — Other Ambulatory Visit: Payer: Self-pay

## 2022-09-14 ENCOUNTER — Ambulatory Visit: Payer: Self-pay

## 2022-09-14 ENCOUNTER — Other Ambulatory Visit: Payer: Self-pay

## 2022-09-14 NOTE — Patient Instructions (Signed)
Visit Information  Thank you for taking time to visit with me today. Please don't hesitate to contact me if I can be of assistance to you.   Following are the goals we discussed today:   Goals Addressed             This Visit's Progress    COMPLETED: Care Coordination Activities       Care Coordination Interventions: SDoH screening performed - no acute resource challenges identified Education provided on role of care coordination team - no follow up desired at this time Instructed the patient to contact her primary care provider as needed         If you are experiencing a Mental Health or [place] or need someone to talk to, please call 1-800-273-TALK (toll free, 24 hour hotline)  The patient verbalized understanding of instructions, educational materials, and care plan provided today and DECLINED offer to receive copy of patient instructions, educational materials, and care plan.   No further follow up required: Please contact your primary care provider as needed

## 2022-09-14 NOTE — Patient Outreach (Signed)
  Care Coordination   Initial Visit Note   09/14/2022 Name: [name] MRN: [id number] DOB: 1946/07/13  [name] is a 76 y.o. year old female who sees Charlott Rakes, MD for primary care. I  spoke with patients daughter and caregiver by phone  What matters to the patients health and wellness today?  No concerns, doing well at this time    Goals Addressed             This Visit's Progress    COMPLETED: Care Coordination Activities       Care Coordination Interventions: SDoH screening performed - no acute resource challenges identified Education provided on role of care coordination team - no follow up desired at this time Instructed the patient to contact her primary care provider as needed         SDOH assessments and interventions completed:  Yes  SDOH Interventions Today    Flowsheet Row Most Recent Value  SDOH Interventions   Food Insecurity Interventions Intervention Not Indicated  Housing Interventions Intervention Not Indicated  Transportation Interventions Intervention Not Indicated        Care Coordination Interventions Activated:  Yes  Care Coordination Interventions:  Yes, provided   Follow up plan: No further intervention required.   Encounter Outcome:  Pt. Visit Completed   Daneen Schick, BSW, CDP Social Worker, Certified Dementia Practitioner [facility]  Care Coordination [phone]

## 2022-09-15 ENCOUNTER — Other Ambulatory Visit: Payer: Self-pay

## 2022-10-04 ENCOUNTER — Other Ambulatory Visit: Payer: Self-pay

## 2022-10-05 ENCOUNTER — Other Ambulatory Visit: Payer: Self-pay

## 2022-10-13 ENCOUNTER — Other Ambulatory Visit: Payer: Self-pay

## 2022-10-20 ENCOUNTER — Ambulatory Visit (INDEPENDENT_AMBULATORY_CARE_PROVIDER_SITE_OTHER): Payer: Medicare Other | Admitting: Neurology

## 2022-10-20 ENCOUNTER — Encounter: Payer: Self-pay | Admitting: Neurology

## 2022-10-20 ENCOUNTER — Other Ambulatory Visit: Payer: Self-pay

## 2022-10-20 VITALS — BP 175/88 | HR 74 | Ht 63.0 in | Wt 178.0 lb

## 2022-10-20 DIAGNOSIS — Z5181 Encounter for therapeutic drug level monitoring: Secondary | ICD-10-CM

## 2022-10-20 DIAGNOSIS — G40009 Localization-related (focal) (partial) idiopathic epilepsy and epileptic syndromes with seizures of localized onset, not intractable, without status epilepticus: Secondary | ICD-10-CM

## 2022-10-20 DIAGNOSIS — I639 Cerebral infarction, unspecified: Secondary | ICD-10-CM | POA: Diagnosis not present

## 2022-10-20 DIAGNOSIS — R4701 Aphasia: Secondary | ICD-10-CM | POA: Diagnosis not present

## 2022-10-20 MED ORDER — LACOSAMIDE 100 MG PO TABS
ORAL_TABLET | ORAL | 3 refills | Status: DC
  Filled 2022-10-20: qty 225, 90d supply, fill #0
  Filled 2022-12-01: qty 75, 30d supply, fill #0
  Filled 2023-01-24: qty 75, 30d supply, fill #1
  Filled 2023-03-18: qty 75, 30d supply, fill #2
  Filled 2023-05-16: qty 75, 30d supply, fill #3
  Filled 2023-08-01: qty 75, 30d supply, fill #4
  Filled 2023-10-19: qty 75, 30d supply, fill #5

## 2022-10-20 NOTE — Patient Instructions (Signed)
Continue current medication: Vimpat 100 mg AM and 150 mg PM Will check Vimpat level and CMP  Continue your other medications  Return in a year or sooner if worse

## 2022-10-20 NOTE — Progress Notes (Signed)
[facility]  PATIENT: [name] DOB: 22-Sep-1946  REFERRING CLINICIAN: Hoy Register, MD HISTORY FROM: Daughter [name]  REASON FOR VISIT: Seizure   HISTORICAL  CHIEF COMPLAINT:  Chief Complaint  Patient presents with   Follow-up    RM 15, States she is stable and no new concerns    INTERVAL HISTORY 10/20/2022:  Patient presents today for follow up. She is accompanied by her daughter. Since last visit a year ago, she has been doing well, denies any seizure or seizure like activity. She is tolerating the Vimpat 100 mg AM and 150 mg PM. Denies any side effects from the medications. Daughter denies any new strokes, seizure or worsening aphasia. No recent falls.    INTERVAL HISTORY 10/20/2021 Patient present today for follow-up with daughter [name].  At last visit plan was to discontinue Keppra and start the patient on lacosamide due tO increased daytime sleepiness.  Since starting the lacosamide, daytime sleepiness has improved, she is more active and more alert during the daytime.  Denies any seizures since last visit.  Denies any side effect from the medication.  Daughter is satisfied with the current regimen and has no complaint or concern at the moment.  HISTORY OF PRESENT ILLNESS:  This is a 76 year old woman with past medical history of strokes, diabetes mellitus type 2 who is presenting after a seizure in April. Daughter at bedside reports patient was outside smoking, then she called for help, grandson who was in the house went to help her, he noticed that she was dragging her left foot, she was not talking and fell off grandson's arms.  He helped her the floor and notice patient had her mouth opened and was staring. Episode lasted a couple minutes, by the time EMS arrived patient episode ended but she was confused and was not talking.  She was taken to the ED where she had a stroke work-up which was negative for acute stroke and had a EEG which showed  a epileptogenic area in the left frontal and left temporal region.  She was started on Keppra 500 mg XR .  Daughter mentioned this was the first time the patient had an event like that, since being on Levetiracetam, she has not had any episode similar to the previous one.  But he has been complaining of unstable gait, dizziness, states things are dizzy, daughter also mentioned that patient has some visual hallucination, seeing two little girls talking to her, sometimes she will see someone talking to her daughter, she also had a complaint of joint pain, daughter is not sure if this is related to her known arthritis or this is a related to the Keppra.  Daughter mentioned the patient is still independent she is able to cook clean, bath and dress herself, she is not driving, she has issues with speech and comprehension but she is able to follow simple commands    Handedness: Right handed   Seizure Type: Unclear, possibly focal seizure  Current frequency: Only once   Any injuries from seizures: None  Seizure risk factors: Multiple stroke, no family hx of seizure  Previous ASMs: Levetiracetam   Currenty ASMs: Lacosamide 100/150   ASMs side effects: Sleepiness, dizziness, visual hallucinations, mood changes   Brain Images: 1. No evidence of acute intracranial abnormality. Specifically, no acute infarct. 2. 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 3. Chronic left M1 occlusion better characterized on same day CTA.  Previous EEGs: rEEG: This study showed evidence of  epileptogenicity as well as cortical dysfunction in left frontotemporal region likely secondary to underlying stroke.  No seizures or epileptiform discharges were seen throughout the recording   OTHER MEDICAL CONDITIONS: Multiple strokes, DMII, Dementia  REVIEW OF SYSTEMS: Unable to fully complete as patient has aphasia.   ALLERGIES: Allergies  Allergen Reactions   Penicillins Itching    Sulfa Antibiotics Other (See Comments)    Reaction not recalled, but patient was told she was allergic    HOME MEDICATIONS: Outpatient Medications Prior to Visit  Medication Sig Dispense Refill   acetaminophen (TYLENOL) 325 MG tablet Take 650 mg by mouth every 6 (six) hours as needed for pain or moderate pain.     aspirin 81 MG chewable tablet Chew 81 mg by mouth in the morning.     atenolol (TENORMIN) 25 MG tablet Take 1 tablet (25 mg total) by mouth daily. 90 tablet 1   atorvastatin (LIPITOR) 40 MG tablet Take 1 tablet (40 mg total) by mouth daily. 90 tablet 1   Blood Glucose Monitoring Suppl (ACCU-CHEK AVIVA) device Use as instructed daily. 1 each 0   clopidogrel (PLAVIX) 75 MG tablet Take 1 tablet (75 mg total) by mouth daily. 90 tablet 1   diclofenac Sodium (VOLTAREN) 1 % GEL Apply 4 g topically 4 (four) times daily as needed (arthritis pain).     folic acid (FOLVITE) 1 MG tablet Take 1 tablet (1 mg total) by mouth daily. 30 tablet 0   glucose blood (TRUE METRIX BLOOD GLUCOSE TEST) test strip Use as instructed 100 each 6   Lancets (ACCU-CHEK MULTICLIX) lancets Use as instructed 100 each 12   lisinopril (ZESTRIL) 10 MG tablet Take 1 tablet (10 mg total) by mouth daily. 90 tablet 1   metFORMIN (GLUCOPHAGE) 500 MG tablet Take 2 tablets (1,000 mg total) by mouth 2 (two) times daily with a meal. (Patient will need to make an office visit for next refill) 360 tablet 1   vitamin B-12 (CYANOCOBALAMIN) 1000 MCG tablet Take 1 tablet (1,000 mcg total) by mouth daily. 30 tablet 0   Lacosamide 100 MG TABS Take 1.5 tablets (150 mg total) by mouth 2 (two) times daily. 180 tablet 4   nitroGLYCERIN (NITROSTAT) 0.4 MG SL tablet Place 1 tablet (0.4 mg total) under the tongue every 5 (five) minutes as needed for chest pain. 30 tablet 3   No facility-administered medications prior to visit.    PAST MEDICAL HISTORY: Past Medical History:  Diagnosis Date   AKI (acute kidney injury) 11/05/2021   AMS  (altered mental status) 03/12/2021   Facial weakness 07/08/2014   Hypertension    Left leg weakness 07/08/2014   Osteoarthritis of hip 06/18/2021   Seizure 11/05/2021   STEMI (ST elevation myocardial infarction) 02/19/2022   Stroke    large left frontal lobe infarct with associated encephalomalacia. Additional redemonstrated infarcts in the right occipital lobe, left parietal lobe, posterior left temporal lobe, right cerebellum, and left greater than right basal ganglia   Subdural hemorrhage 07/08/2014   Type II diabetes mellitus 07/09/2014   Vascular dementia 07/13/2022    PAST SURGICAL HISTORY: Past Surgical History:  Procedure Laterality Date   CORONARY STENT INTERVENTION N/A 02/19/2022   Procedure: CORONARY STENT INTERVENTION;  Surgeon: Elder Negus, MD;  Location: [facility];  Service: Cardiovascular;  Laterality: N/A;   CORONARY/GRAFT ACUTE MI REVASCULARIZATION N/A 02/19/2022   Procedure: Coronary/Graft Acute MI Revascularization;  Surgeon: Elder Negus, MD;  Location: [facility];  Service: Cardiovascular;  Laterality: N/A;   LEFT HEART CATH AND CORONARY ANGIOGRAPHY N/A 02/19/2022   Procedure: LEFT HEART CATH AND CORONARY ANGIOGRAPHY;  Surgeon: Elder Negus, MD;  Location: [facility];  Service: Cardiovascular;  Laterality: N/A;   NO PAST SURGERIES      FAMILY HISTORY: Family History  Problem Relation Age of Onset   Hyperlipidemia Mother    Hypertension Mother    Diabetes Mother    Stroke Father     SOCIAL HISTORY: Social History   Socioeconomic History   Marital status: Widowed    Spouse name: Not on file   Number of children: 3   Years of education: 12   Highest education level: High school graduate  Occupational History   Occupation: Retired  Tobacco Use   Smoking status: Former    Packs/day: 1.00    Years: 48.00    Total pack years: 48.00    Types: Cigarettes    Quit date: 2023    Years since quitting: 0.9    Smokeless tobacco: Never  Vaping Use   Vaping Use: Never used  Substance and Sexual Activity   Alcohol use: No   Drug use: No   Sexual activity: Not on file  Other Topics Concern   Not on file  Social History Narrative   Lives with daughter, [name], and her grandchildren.   Right-handed.   Three cups caffeine daily.   Social Determinants of Health   Financial Resource Strain: Not on file  Food Insecurity: No Food Insecurity (09/14/2022)   Hunger Vital Sign    Worried About Running Out of Food in the Last Year: Never true    Ran Out of Food in the Last Year: Never true  Transportation Needs: No Transportation Needs (09/14/2022)   PRAPARE - Administrator, Civil Service (Medical): No    Lack of Transportation (Non-Medical): No  Physical Activity: Not on file  Stress: Not on file  Social Connections: Not on file  Intimate Partner Violence: Not on file     PHYSICAL EXAM  GENERAL EXAM/CONSTITUTIONAL: Vitals:  Vitals:   10/20/22 1350  BP: (!) 175/88  Pulse: 74  Weight: 178 lb (80.7 kg)  Height: 5\' 3"  (1.6 m)    Body mass index is 31.53 kg/m. Wt Readings from Last 3 Encounters:  10/20/22 178 lb (80.7 kg)  08/12/22 178 lb 9.6 oz (81 kg)  05/28/22 189 lb 6 oz (85.9 kg)   Patient is in no distress; well developed, nourished and groomed; neck is supple  EYES: Pupils round and reactive to light, Visual fields full to confrontation, Extraocular movements intacts,   MUSCULOSKELETAL: Gait, strength, tone, movements noted in Neurologic exam below  NEUROLOGIC: MENTAL STATUS: awake, alert, oriented to person,  Unable to provide history  language aphasic, able to name simple object, able to follow simple 1 step commands but not 2 step embedded commands, unable to repeat   CRANIAL NERVE:  2nd, 3rd, 4th, 6th - pupils equal and reactive to light, visual fields full to confrontation, extraocular muscles intact, no nystagmus 5th - facial sensation symmetric 7th  - facial strength symmetric 8th - hearing intact 9th - palate elevates symmetrically, uvula midline 11th - shoulder shrug symmetric 12th - tongue protrusion midline  MOTOR:  normal bulk and tone, full strength in the BUE, BLE  SENSORY:  normal and symmetric to light touch  COORDINATION:  finger-nose-finger, fine finger movements normal  GAIT/STATION:  Mild hemiplegic gait  DIAGNOSTIC DATA (LABS, IMAGING, TESTING) - I reviewed patient records, labs, notes, testing and imaging myself where available.  Lab Results  Component Value Date   WBC 9.2 05/28/2022   HGB 12.6 05/29/2022   HCT 37.0 05/29/2022   MCV 85.7 05/28/2022   PLT 290 05/28/2022      Component Value Date/Time   NA 140 08/12/2022 0921   K 4.8 08/12/2022 0921   CL 103 08/12/2022 0921   CO2 22 08/12/2022 0921   GLUCOSE 166 (H) 08/12/2022 0921   GLUCOSE 147 (H) 05/29/2022 0020   BUN 16 08/12/2022 0921   CREATININE 1.07 (H) 08/12/2022 0921   CALCIUM 9.5 08/12/2022 0921   PROT 6.1 (L) 05/28/2022 2338   PROT 6.6 01/01/2021 1647   ALBUMIN 3.4 (L) 05/28/2022 2338   ALBUMIN 4.2 01/01/2021 1647   AST 18 05/28/2022 2338   ALT 11 05/28/2022 2338   ALKPHOS 56 05/28/2022 2338   BILITOT 0.5 05/28/2022 2338   BILITOT 0.3 01/01/2021 1647   GFRNONAA 50 (L) 05/28/2022 2338   GFRAA 65 01/01/2021 1647   Lab Results  Component Value Date   CHOL 165 02/19/2022   HDL 51 02/19/2022   LDLCALC 84 02/19/2022   TRIG 148 02/19/2022   Lab Results  Component Value Date   HGBA1C 6.9 08/12/2022   Lab Results  Component Value Date   VITAMINB12 167 (L) 11/05/2021   Lab Results  Component Value Date   TSH 1.497 11/05/2021    MRI Brain 4/21/220 1. No evidence of acute intracranial abnormality. Specifically, no acute infarct. 2. 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 3. Chronic left M1 occlusion better characterized on same day CTA.  Routine EEG 03/13/21 This study  showed evidence of epileptogenicity as well as cortical dysfunction in left frontotemporal region likely secondary to underlying stroke.  No seizures or epileptiform discharges were seen throughout the recording  I personally reviewed brain Images and previous EEG reports.   ASSESSMENT AND PLAN  76 y.o. year old female here with past medical history of multiple strokes, diabetes mellitus type 2, seizure disorder who is presenting for follow-up.  She is currently on lacosamide 100 mg AM and 150 mg PM, denies any side effect from the medication, denies any seizures since last visit.  She is doing well on the lacosamide, no other complaints, no other concerns.  We will continue lacosamide at the same dose, I will also get a level today.  I will see the patient in 1 year for follow-up.   1. Localization-related idiopathic epilepsy and epileptic syndromes with seizures of localized onset, not intractable, without status epilepticus (HCC)   2. Cerebrovascular accident (CVA), unspecified mechanism (HCC)   3. Aphasia   4. Therapeutic drug monitoring      PLAN: Continue current medication, Lacosamide 100 mg AM and 150 mg PM   Will check a Lacosamide level today  Return in 1 year    Per [facility] statutes, patients with seizures are not allowed to drive until they have been seizure-free for six months.  Other recommendations include using caution when using heavy equipment or power tools. Avoid working on ladders or at heights. Take showers instead of baths.  Do not swim alone.  Ensure the water temperature is not too high on the home water heater. Do not go swimming alone. Do not lock yourself in a room alone (i.e. bathroom). When caring for infants or small children, sit down when holding, feeding, or changing  them to minimize risk of injury to the child in the event you have a seizure. Maintain good sleep hygiene. Avoid alcohol.  Also recommend adequate sleep, hydration, good diet and  minimize stress.   During the Seizure  - First, ensure adequate ventilation and place patients on the floor on their left side  Loosen clothing around the neck and ensure the airway is patent. If the patient is clenching the teeth, do not force the mouth open with any object as this can cause severe damage - Remove all items from the surrounding that can be hazardous. The patient may be oblivious to what's happening and may not even know what he or she is doing. If the patient is confused and wandering, either gently guide him/her away and block access to outside areas - Reassure the individual and be comforting - Call 911. In most cases, the seizure ends before EMS arrives. However, there are cases when seizures may last over 3 to 5 minutes. Or the individual may have developed breathing difficulties or severe injuries. If a pregnant patient or a person with diabetes develops a seizure, it is prudent to call an ambulance. - Finally, if the patient does not regain full consciousness, then call EMS. Most patients will remain confused for about 45 to 90 minutes after a seizure, so you must use judgment in calling for help. - Avoid restraints but make sure the patient is in a bed with padded side rails - Place the individual in a lateral position with the neck slightly flexed; this will help the saliva drain from the mouth and prevent the tongue from falling backward - Remove all nearby furniture and other hazards from the area - Provide verbal assurance as the individual is regaining consciousness - Provide the patient with privacy if possible - Call for help and start treatment as ordered by the caregiver   After the Seizure (Postictal Stage)  After a seizure, most patients experience confusion, fatigue, muscle pain and/or a headache. Thus, one should permit the individual to sleep. For the next few days, reassurance is essential. Being calm and helping reorient the person is also of  importance.  Most seizures are painless and end spontaneously. Seizures are not harmful to others but can lead to complications such as stress on the lungs, brain and the heart. Individuals with prior lung problems may develop labored breathing and respiratory distress.     Orders Placed This Encounter  Procedures   Lacosamide   CMP     Meds ordered this encounter  Medications   Lacosamide 100 MG TABS    Sig: Take 1 tablet (100 mg total) by mouth in the morning AND 1.5 tablets (150 mg total) at bedtime.    Dispense:  225 tablet    Refill:  3     Return in about 1 year (around 10/21/2023).    Windell Norfolk, MD 10/20/2022, 2:29 PM  [facility] [address] [place], [place] [address] [phone]

## 2022-10-23 LAB — COMPREHENSIVE METABOLIC PANEL
ALT: 12 IU/L (ref 0–32)
AST: 20 IU/L (ref 0–40)
Albumin/Globulin Ratio: 1.6 (ref 1.2–2.2)
Albumin: 4.4 g/dL (ref 3.8–4.8)
Alkaline Phosphatase: 76 IU/L (ref 44–121)
BUN/Creatinine Ratio: 11 — ABNORMAL LOW (ref 12–28)
BUN: 11 mg/dL (ref 8–27)
Bilirubin Total: 0.5 mg/dL (ref 0.0–1.2)
CO2: 22 mmol/L (ref 20–29)
Calcium: 9.7 mg/dL (ref 8.7–10.3)
Chloride: 103 mmol/L (ref 96–106)
Creatinine, Ser: 1 mg/dL (ref 0.57–1.00)
Globulin, Total: 2.7 g/dL (ref 1.5–4.5)
Glucose: 103 mg/dL — ABNORMAL HIGH (ref 70–99)
Potassium: 4.9 mmol/L (ref 3.5–5.2)
Sodium: 142 mmol/L (ref 134–144)
Total Protein: 7.1 g/dL (ref 6.0–8.5)
eGFR: 59 mL/min/{1.73_m2} — ABNORMAL LOW (ref >59)

## 2022-10-23 LAB — LACOSAMIDE: Lacosamide: 7.1 ug/mL (ref 5.0–10.0)

## 2022-10-23 NOTE — Progress Notes (Signed)
Please call and advise the patient that the recent labs we checked were within normal limits.  Her kidney function was slight abnormal but better when compared to previous results.  No further action is required on these tests at this time. Please remind patient to keep any upcoming appointments or tests and to call us with any interim questions, concerns, problems or updates. Thanks,   Windell Norfolk, MD

## 2022-10-27 ENCOUNTER — Telehealth: Payer: Self-pay

## 2022-10-27 NOTE — Telephone Encounter (Signed)
-----   Message from Windell Norfolk, MD sent at 10/23/2022 10:01 AM EST ----- Please call and advise the patient that the recent labs we checked were within normal limits.  Her kidney function was slight abnormal but better when compared to previous results.  No further action is required on these tests at this time. Please remind patient to keep any upcoming appointments or tests and to call us with any interim questions, concerns, problems or updates. Thanks,   Windell Norfolk, MD

## 2022-10-27 NOTE — Telephone Encounter (Signed)
I called the pt and spoke with her daughter( ok per dpr).  I updated on lab results and she verbalized understanding and appreciation for the call.   She will keep yearly f/u as scheduled.

## 2022-11-19 ENCOUNTER — Other Ambulatory Visit: Payer: Self-pay

## 2022-11-29 ENCOUNTER — Encounter: Payer: Self-pay | Admitting: Cardiology

## 2022-11-29 ENCOUNTER — Ambulatory Visit: Payer: Medicare Other | Admitting: Cardiology

## 2022-11-29 VITALS — BP 147/80 | HR 83 | Resp 16 | Ht 63.0 in | Wt 186.6 lb

## 2022-11-29 DIAGNOSIS — I2510 Atherosclerotic heart disease of native coronary artery without angina pectoris: Secondary | ICD-10-CM

## 2022-11-29 DIAGNOSIS — I10 Essential (primary) hypertension: Secondary | ICD-10-CM

## 2022-11-29 NOTE — Progress Notes (Signed)
Follow up visit  Subjective:   [name], female    DOB: 05/21/1946, 77 y.o.   MRN: [id number]   HPI  Chief Complaint  Patient presents with   Coronary Artery Disease   Hyperlipidemia   Follow-up    77 y.o. African American female  with hypertension, type 2 DM, CAD (STEMI and PPCI 01/2022), h/o stroke 2015 w/subdural hematoma, h/o seizures, dementia  Patient is here with her daughter. She is doing fairly well. Dementia is present, but she is able to perform most ADL's and walking around the house without much difficulty. She denies chest pain, shortness of breath, palpitations, leg edema, orthopnea, PND, TIA/syncope. It appears that she has been taking Aspirin and plavix ever since her stroke in 2015. Blood pressure elevated in office but generally well controlled at home.    Current Outpatient Medications:    acetaminophen (TYLENOL) 325 MG tablet, Take 650 mg by mouth every 6 (six) hours as needed for pain or moderate pain., Disp: , Rfl:    aspirin 81 MG chewable tablet, Chew 81 mg by mouth in the morning., Disp: , Rfl:    atenolol (TENORMIN) 25 MG tablet, Take 1 tablet (25 mg total) by mouth daily., Disp: 90 tablet, Rfl: 1   atorvastatin (LIPITOR) 40 MG tablet, Take 1 tablet (40 mg total) by mouth daily., Disp: 90 tablet, Rfl: 1   Blood Glucose Monitoring Suppl (ACCU-CHEK AVIVA) device, Use as instructed daily., Disp: 1 each, Rfl: 0   clopidogrel (PLAVIX) 75 MG tablet, Take 1 tablet (75 mg total) by mouth daily., Disp: 90 tablet, Rfl: 1   diclofenac Sodium (VOLTAREN) 1 % GEL, Apply 4 g topically 4 (four) times daily as needed (arthritis pain)., Disp: , Rfl:    folic acid (FOLVITE) 1 MG tablet, Take 1 tablet (1 mg total) by mouth daily., Disp: 30 tablet, Rfl: 0   glucose blood (TRUE METRIX BLOOD GLUCOSE TEST) test strip, Use as instructed, Disp: 100 each, Rfl: 6   Lacosamide 100 MG TABS, Take 1 tablet (100 mg total) by mouth in the morning AND 1.5 tablets (150 mg total) at  bedtime., Disp: 225 tablet, Rfl: 3   Lancets (ACCU-CHEK MULTICLIX) lancets, Use as instructed, Disp: 100 each, Rfl: 12   lisinopril (ZESTRIL) 10 MG tablet, Take 1 tablet (10 mg total) by mouth daily., Disp: 90 tablet, Rfl: 1   metFORMIN (GLUCOPHAGE) 500 MG tablet, Take 2 tablets (1,000 mg total) by mouth 2 (two) times daily with a meal. (Patient will need to make an office visit for next refill), Disp: 360 tablet, Rfl: 1   nitroGLYCERIN (NITROSTAT) 0.4 MG SL tablet, Place 1 tablet (0.4 mg total) under the tongue every 5 (five) minutes as needed for chest pain., Disp: 30 tablet, Rfl: 3   vitamin B-12 (CYANOCOBALAMIN) 1000 MCG tablet, Take 1 tablet (1,000 mcg total) by mouth daily., Disp: 30 tablet, Rfl: 0   Cardiovascular & other pertient studies:  Reviewed external labs and tests, independently interpreted  EKG 11/29/2022: Sinus rhythm 84 bpm IVCD  Low voltage in precordial leads  Echocardiogram 02/19/2021:  1. Left ventricular ejection fraction, by estimation, is 55 to 60%. The  left ventricle has normal function. The left ventricle has no regional  wall motion abnormalities. There is moderate left ventricular hypertrophy  of the basal-septal segment. Left  ventricular diastolic parameters are consistent with Grade I diastolic  dysfunction (impaired relaxation).   2. Right ventricular systolic function is normal. The right ventricular  size is  normal.   3. The mitral valve is abnormal. Mild to moderate mitral valve  regurgitation. Severe mitral annular calcification.   4. The aortic valve is tricuspid. Aortic valve regurgitation is not  visualized.   Comparison(s): A prior study was performed on 03/13/2021. Which showed  trivial mitral regurgitation. no other significant change noted.    Coronary intervention 02/19/2022: LM: Normal LAD: Minimal luminal irregularities Lcx: Prox 50% disease (non-culprit)        Prox-mid 95% stenosis (culprit) RCA: Prox 20% disease   LVEDP 27 mmHg    Successful percutaneous coronary intervention mid LCx        PTCA and stent placement 3.0 X 20 mm Synergy drug-eluting stent        Post dilatation with 3.25X15 mm [place] balloon at 16 atm 95%-->0% stenosis at culprit lesion TIMI III flow      Recent labs: Glucose 103, BUN/Cr 11/1.0. EGFR 59. Na/K 142/4.9. Rest of the CMP normal (09/2022) H/H 12/39. MCV 85. Platelets 290 (05/2022) HbA1C 6.9% (07/2022) Chol 165, TG 148, HDL 51, LDL 84 (01/2022) TSH 1.4 normal (10/2021)   Review of Systems  Cardiovascular:  Negative for chest pain, dyspnea on exertion, leg swelling, palpitations and syncope.         Vitals:   11/29/22 1137  BP: (!) 147/80  Pulse: 83  Resp: 16  SpO2: 100%    Body mass index is 33.05 kg/m. Filed Weights   11/29/22 1137  Weight: 186 lb 9.6 oz (84.6 kg)     Objective:   Physical Exam Vitals and nursing note reviewed.  Constitutional:      General: She is not in acute distress. Neck:     Vascular: No JVD.  Cardiovascular:     Rate and Rhythm: Normal rate and regular rhythm.     Heart sounds: Normal heart sounds. No murmur heard. Pulmonary:     Effort: Pulmonary effort is normal.     Breath sounds: Normal breath sounds. No wheezing or rales.  Musculoskeletal:     Right lower leg: No edema.     Left lower leg: No edema.          Visit diagnoses:   ICD-10-CM   1. Coronary artery disease involving native coronary artery of native heart without angina pectoris  I25.10 EKG 12-Lead    2. Essential hypertension  I10        Orders Placed This Encounter  Procedures   EKG 12-Lead     Assessment & Recommendations:   77 y.o. African American female  with hypertension, type 2 DM, CAD (STEMI and PPCI 01/2022), h/o stroke 2015 w/subdural hematoma, h/o seizures, dementia  CAD: STEMI 02/2022. PPCI to Lcx Continue DAPT with Aspirin and Plavix till 02/2022. Beyond that, plavix not necessary from cardiac standpoint. Continue atenolol 25 mg  daily Continue Lipitor 40 mg daily. Check lipid panel. If >70, increase Lipitor to 80 mg daily.  Type 2 DM: Well controlled. Continue metformin, lisinopril  Hypertension: Well controlled at home. NO change made today.   F/u in 1 year    Elder Negus, MD Pager: [phone] Office: [phone]

## 2022-12-01 ENCOUNTER — Other Ambulatory Visit: Payer: Self-pay

## 2022-12-10 ENCOUNTER — Other Ambulatory Visit: Payer: Self-pay

## 2022-12-27 ENCOUNTER — Other Ambulatory Visit: Payer: Self-pay

## 2023-01-24 ENCOUNTER — Other Ambulatory Visit: Payer: Self-pay

## 2023-01-25 ENCOUNTER — Other Ambulatory Visit: Payer: Self-pay

## 2023-02-04 ENCOUNTER — Other Ambulatory Visit: Payer: Self-pay

## 2023-02-10 ENCOUNTER — Ambulatory Visit: Payer: Medicare Other | Attending: Family Medicine | Admitting: Family Medicine

## 2023-02-10 ENCOUNTER — Other Ambulatory Visit: Payer: Self-pay

## 2023-02-10 ENCOUNTER — Encounter: Payer: Self-pay | Admitting: Family Medicine

## 2023-02-10 VITALS — BP 132/82 | HR 81 | Ht 63.0 in | Wt 184.8 lb

## 2023-02-10 DIAGNOSIS — M549 Dorsalgia, unspecified: Secondary | ICD-10-CM | POA: Insufficient documentation

## 2023-02-10 DIAGNOSIS — F0150 Vascular dementia without behavioral disturbance: Secondary | ICD-10-CM | POA: Diagnosis not present

## 2023-02-10 DIAGNOSIS — Z9861 Coronary angioplasty status: Secondary | ICD-10-CM | POA: Diagnosis not present

## 2023-02-10 DIAGNOSIS — F17210 Nicotine dependence, cigarettes, uncomplicated: Secondary | ICD-10-CM

## 2023-02-10 DIAGNOSIS — E119 Type 2 diabetes mellitus without complications: Secondary | ICD-10-CM | POA: Diagnosis present

## 2023-02-10 DIAGNOSIS — E1169 Type 2 diabetes mellitus with other specified complication: Secondary | ICD-10-CM | POA: Diagnosis not present

## 2023-02-10 DIAGNOSIS — Z7984 Long term (current) use of oral hypoglycemic drugs: Secondary | ICD-10-CM | POA: Diagnosis not present

## 2023-02-10 DIAGNOSIS — R569 Unspecified convulsions: Secondary | ICD-10-CM | POA: Insufficient documentation

## 2023-02-10 DIAGNOSIS — E1159 Type 2 diabetes mellitus with other circulatory complications: Secondary | ICD-10-CM

## 2023-02-10 DIAGNOSIS — Z79899 Other long term (current) drug therapy: Secondary | ICD-10-CM | POA: Insufficient documentation

## 2023-02-10 DIAGNOSIS — I69320 Aphasia following cerebral infarction: Secondary | ICD-10-CM | POA: Diagnosis not present

## 2023-02-10 DIAGNOSIS — F01B0 Vascular dementia, moderate, without behavioral disturbance, psychotic disturbance, mood disturbance, and anxiety: Secondary | ICD-10-CM

## 2023-02-10 DIAGNOSIS — Z7902 Long term (current) use of antithrombotics/antiplatelets: Secondary | ICD-10-CM | POA: Diagnosis not present

## 2023-02-10 DIAGNOSIS — Z8673 Personal history of transient ischemic attack (TIA), and cerebral infarction without residual deficits: Secondary | ICD-10-CM | POA: Diagnosis not present

## 2023-02-10 DIAGNOSIS — I10 Essential (primary) hypertension: Secondary | ICD-10-CM | POA: Diagnosis not present

## 2023-02-10 DIAGNOSIS — Z87891 Personal history of nicotine dependence: Secondary | ICD-10-CM | POA: Insufficient documentation

## 2023-02-10 DIAGNOSIS — I152 Hypertension secondary to endocrine disorders: Secondary | ICD-10-CM

## 2023-02-10 LAB — POCT GLYCOSYLATED HEMOGLOBIN (HGB A1C): HbA1c, POC (controlled diabetic range): 6.8 % (ref 0.0–7.0)

## 2023-02-10 MED ORDER — DICLOFENAC SODIUM 1 % EX GEL
4.0000 g | Freq: Four times a day (QID) | CUTANEOUS | 3 refills | Status: AC | PRN
  Filled 2023-02-10: qty 100, 7d supply, fill #0

## 2023-02-10 MED ORDER — ATENOLOL 25 MG PO TABS
25.0000 mg | ORAL_TABLET | Freq: Every day | ORAL | 1 refills | Status: DC
  Filled 2023-02-10 – 2023-03-04 (×2): qty 90, 90d supply, fill #0
  Filled 2023-06-24: qty 90, 90d supply, fill #1

## 2023-02-10 MED ORDER — METFORMIN HCL 500 MG PO TABS
1000.0000 mg | ORAL_TABLET | Freq: Two times a day (BID) | ORAL | 1 refills | Status: DC
  Filled 2023-02-10 – 2023-03-18 (×2): qty 360, 90d supply, fill #0
  Filled 2023-06-24: qty 360, 90d supply, fill #1

## 2023-02-10 MED ORDER — LISINOPRIL 10 MG PO TABS
10.0000 mg | ORAL_TABLET | Freq: Every day | ORAL | 1 refills | Status: DC
  Filled 2023-02-10 – 2023-03-04 (×2): qty 90, 90d supply, fill #0
  Filled 2023-06-24: qty 90, 90d supply, fill #1

## 2023-02-10 MED ORDER — ATORVASTATIN CALCIUM 40 MG PO TABS
40.0000 mg | ORAL_TABLET | Freq: Every day | ORAL | 1 refills | Status: DC
  Filled 2023-02-10: qty 90, 90d supply, fill #0
  Filled 2023-03-18: qty 30, 30d supply, fill #0
  Filled 2023-05-12: qty 30, 30d supply, fill #1
  Filled 2023-06-24: qty 30, 30d supply, fill #2

## 2023-02-10 MED ORDER — CLOPIDOGREL BISULFATE 75 MG PO TABS
75.0000 mg | ORAL_TABLET | Freq: Every day | ORAL | 1 refills | Status: DC
  Filled 2023-02-10: qty 90, 90d supply, fill #0
  Filled 2023-03-18: qty 30, 30d supply, fill #0
  Filled 2023-05-12: qty 30, 30d supply, fill #1
  Filled 2023-06-24: qty 30, 30d supply, fill #2

## 2023-02-10 NOTE — Progress Notes (Signed)
Subjective:  Patient ID: [name], female    DOB: February 15, 1946  Age: 77 y.o. MRN: [id number]  CC: Diabetes   HPI [name] is a 77 y.o. year old female with a history of type 2 diabetes mellitus (A1C6.8), hypertension, previous multiple CVAs with residual aphasia, CAD (status post PCI to mid LCx in 01/2022), seizures, vascular dementia  Quit smoking in 2023 (smoked 1 pack a day since x 60 years).  Interval History:  Per Cardioloy she is done with DAPT with ASA and Plavix in 02/2022 but due to her multiple strokes per daughter she needs to be on Plavix. Last seen by cardiology in 11/2022 and neurology in the fall of last year. She remains on lacosamide for her seizures which has been prescribed by neurology and she has a support of her daughter and her granddaughter.  She is able to communicate with me during the visit and per daughter she remains in high spirits. History is provided by both the patient and her daughter.  She is doing well on metformin for her diabetes and has no hypoglycemia.  Tolerating her antihypertensive and her statin. She does not get much exercise. He needs a refill of Voltaren gel which she uses intermittently for back pain which does not radiate. Past Medical History:  Diagnosis Date   AKI (acute kidney injury) 11/05/2021   AMS (altered mental status) 03/12/2021   Facial weakness 07/08/2014   Hyperlipidemia    Hypertension    Left leg weakness 07/08/2014   Osteoarthritis of hip 06/18/2021   Seizure 11/05/2021   STEMI (ST elevation myocardial infarction) 02/19/2022   Stroke    large left frontal lobe infarct with associated encephalomalacia. Additional redemonstrated infarcts in the right occipital lobe, left parietal lobe, posterior left temporal lobe, right cerebellum, and left greater than right basal ganglia   Subdural hemorrhage 07/08/2014   Type II diabetes mellitus 07/09/2014   Vascular dementia 07/13/2022    Past Surgical  History:  Procedure Laterality Date   CORONARY STENT INTERVENTION N/A 02/19/2022   Procedure: CORONARY STENT INTERVENTION;  Surgeon: Nigel Mormon, MD;  Location: [facility];  Service: Cardiovascular;  Laterality: N/A;   CORONARY/GRAFT ACUTE MI REVASCULARIZATION N/A 02/19/2022   Procedure: Coronary/Graft Acute MI Revascularization;  Surgeon: Nigel Mormon, MD;  Location: [facility];  Service: Cardiovascular;  Laterality: N/A;   LEFT HEART CATH AND CORONARY ANGIOGRAPHY N/A 02/19/2022   Procedure: LEFT HEART CATH AND CORONARY ANGIOGRAPHY;  Surgeon: Nigel Mormon, MD;  Location: [facility];  Service: Cardiovascular;  Laterality: N/A;   NO PAST SURGERIES      Family History  Problem Relation Age of Onset   Hyperlipidemia Mother    Hypertension Mother    Diabetes Mother    Stroke Father     Social History   Socioeconomic History   Marital status: Widowed    Spouse name: Not on file   Number of children: 3   Years of education: 12   Highest education level: High school graduate  Occupational History   Occupation: Retired  Tobacco Use   Smoking status: Former    Packs/day: 1.00    Years: 48.00    Additional pack years: 0.00    Total pack years: 48.00    Types: Cigarettes    Quit date: 2023    Years since quitting: 1.2   Smokeless tobacco: Never  Vaping Use   Vaping Use: Never used  Substance and Sexual  Activity   Alcohol use: No   Drug use: No   Sexual activity: Not on file  Other Topics Concern   Not on file  Social History Narrative   Lives with daughter, [name], and her grandchildren.   Right-handed.   Three cups caffeine daily.   Social Determinants of Health   Financial Resource Strain: Not on file  Food Insecurity: No Food Insecurity (09/14/2022)   Hunger Vital Sign    Worried About Running Out of Food in the Last Year: Never true    Ran Out of Food in the Last Year: Never true  Transportation Needs: No Transportation  Needs (09/14/2022)   PRAPARE - Hydrologist (Medical): No    Lack of Transportation (Non-Medical): No  Physical Activity: Not on file  Stress: Not on file  Social Connections: Not on file    Allergies  Allergen Reactions   Penicillins Itching   Sulfa Antibiotics Other (See Comments)    Reaction not recalled, but patient was told she was allergic    Outpatient Medications Prior to Visit  Medication Sig Dispense Refill   acetaminophen (TYLENOL) 325 MG tablet Take 650 mg by mouth every 6 (six) hours as needed for pain or moderate pain.     Blood Glucose Monitoring Suppl (ACCU-CHEK AVIVA) device Use as instructed daily. 1 each 0   folic acid (FOLVITE) 1 MG tablet Take 1 tablet (1 mg total) by mouth daily. 30 tablet 0   glucose blood (TRUE METRIX BLOOD GLUCOSE TEST) test strip Use as instructed 100 each 6   Lacosamide 100 MG TABS Take 1 tablet (100 mg total) by mouth in the morning AND 1.5 tablets (150 mg total) at bedtime. 225 tablet 3   Lancets (ACCU-CHEK MULTICLIX) lancets Use as instructed 100 each 12   vitamin B-12 (CYANOCOBALAMIN) 1000 MCG tablet Take 1 tablet (1,000 mcg total) by mouth daily. 30 tablet 0   aspirin 81 MG chewable tablet Chew 81 mg by mouth in the morning.     atenolol (TENORMIN) 25 MG tablet Take 1 tablet (25 mg total) by mouth daily. 90 tablet 1   atorvastatin (LIPITOR) 40 MG tablet Take 1 tablet (40 mg total) by mouth daily. 90 tablet 1   clopidogrel (PLAVIX) 75 MG tablet Take 1 tablet (75 mg total) by mouth daily. 90 tablet 1   diclofenac Sodium (VOLTAREN) 1 % GEL Apply 4 g topically 4 (four) times daily as needed (arthritis pain).     lisinopril (ZESTRIL) 10 MG tablet Take 1 tablet (10 mg total) by mouth daily. 90 tablet 1   metFORMIN (GLUCOPHAGE) 500 MG tablet Take 2 tablets (1,000 mg total) by mouth 2 (two) times daily with a meal. (Patient will need to make an office visit for next refill) 360 tablet 1   nitroGLYCERIN (NITROSTAT)  0.4 MG SL tablet Place 1 tablet (0.4 mg total) under the tongue every 5 (five) minutes as needed for chest pain. 30 tablet 3   No facility-administered medications prior to visit.     ROS Review of Systems  Constitutional:  Negative for activity change and appetite change.  HENT:  Negative for sinus pressure and sore throat.   Respiratory:  Negative for chest tightness, shortness of breath and wheezing.   Cardiovascular:  Negative for chest pain and palpitations.  Gastrointestinal:  Negative for abdominal distention, abdominal pain and constipation.  Genitourinary: Negative.   Musculoskeletal: Negative.   Psychiatric/Behavioral:  Negative for behavioral problems and dysphoric mood.  Objective:  BP 132/82   Pulse 81   Ht 5\' 3"  (1.6 m)   Wt 184 lb 12.8 oz (83.8 kg)   LMP 09/28/2014 (Approximate)   SpO2 94%   BMI 32.74 kg/m      02/10/2023   10:12 AM 02/10/2023    9:35 AM 11/29/2022   11:37 AM  BP/Weight  Systolic BP Q000111Q 0000000 Q000111Q  Diastolic BP 82 81 80  Wt. (Lbs)  184.8 186.6  BMI  32.74 kg/m2 33.05 kg/m2      Physical Exam Constitutional:      Appearance: She is well-developed.  Cardiovascular:     Rate and Rhythm: Normal rate.     Heart sounds: Normal heart sounds. No murmur heard. Pulmonary:     Effort: Pulmonary effort is normal.     Breath sounds: Normal breath sounds. No wheezing or rales.  Chest:     Chest wall: No tenderness.  Abdominal:     General: Bowel sounds are normal. There is no distension.     Palpations: Abdomen is soft. There is no mass.     Tenderness: There is no abdominal tenderness.  Musculoskeletal:        General: Normal range of motion.     Right lower leg: No edema.     Left lower leg: No edema.  Neurological:     Mental Status: She is alert and oriented to person, place, and time. Mental status is at baseline.  Psychiatric:        Mood and Affect: Mood normal.        Latest Ref Rng & Units 10/20/2022    2:21 PM 08/12/2022     9:21 AM 05/29/2022   12:20 AM  CMP  Glucose 70 - 99 mg/dL 103  166  147   BUN 8 - 27 mg/dL 11  16  12    Creatinine 0.57 - 1.00 mg/dL 1.00  1.07  1.10   Sodium 134 - 144 mmol/L 142  140  143   Potassium 3.5 - 5.2 mmol/L 4.9  4.8  3.9   Chloride 96 - 106 mmol/L 103  103  101   CO2 20 - 29 mmol/L 22  22    Calcium 8.7 - 10.3 mg/dL 9.7  9.5    Total Protein 6.0 - 8.5 g/dL 7.1     Total Bilirubin 0.0 - 1.2 mg/dL 0.5     Alkaline Phos 44 - 121 IU/L 76     AST 0 - 40 IU/L 20     ALT 0 - 32 IU/L 12       Lipid Panel     Component Value Date/Time   CHOL 165 02/19/2022 0654   TRIG 148 02/19/2022 0654   HDL 51 02/19/2022 0654   CHOLHDL 3.2 02/19/2022 0654   VLDL 30 02/19/2022 0654   LDLCALC 84 02/19/2022 0654    CBC    Component Value Date/Time   WBC 9.2 05/28/2022 2338   RBC 4.56 05/28/2022 2338   HGB 12.6 05/29/2022 0020   HGB 13.9 01/01/2021 1647   HCT 37.0 05/29/2022 0020   HCT 42.2 01/01/2021 1647   PLT 290 05/28/2022 2338   PLT 331 01/01/2021 1647   MCV 85.7 05/28/2022 2338   MCV 84 01/01/2021 1647   MCH 26.3 05/28/2022 2338   MCHC 30.7 05/28/2022 2338   RDW 14.2 05/28/2022 2338   RDW 14.1 01/01/2021 1647   LYMPHSABS 2.7 05/28/2022 2338   LYMPHSABS 3.1 01/01/2021 1647  MONOABS 0.5 05/28/2022 2338   EOSABS 0.1 05/28/2022 2338   EOSABS 0.1 01/01/2021 1647   BASOSABS 0.0 05/28/2022 2338   BASOSABS 0.1 01/01/2021 1647    Lab Results  Component Value Date   HGBA1C 6.8 02/10/2023    Assessment & Plan:  1. Type 2 diabetes mellitus with other specified complication, without long-term current use of insulin (HCC) Controlled with A1c of 6.8 Continue metformin Counseled on Diabetic diet, my plate method, [id number] minutes of moderate intensity exercise/week Blood sugar logs with fasting goals of 80-120 mg/dl, random of less than 180 and in the event of sugars less than 60 mg/dl or greater than 400 mg/dl encouraged to notify the clinic. Advised on the need for annual eye  exams, annual foot exams, Pneumonia vaccine. - POCT glycosylated hemoglobin (Hb A1C) - atorvastatin (LIPITOR) 40 MG tablet; Take 1 tablet (40 mg total) by mouth daily.  Dispense: 90 tablet; Refill: 1 - metFORMIN (GLUCOPHAGE) 500 MG tablet; Take 2 tablets (1,000 mg total) by mouth 2 (two) times daily with a meal. (Patient will need to make an office visit for next refill)  Dispense: 360 tablet; Refill: 1 - Microalbumin / creatinine urine ratio - LP+Non-HDL Cholesterol - CMP14+EGFR  2. Hypertension associated with diabetes ([place]) Controlled Counseled on blood pressure goal of less than 130/80, low-sodium, DASH diet, medication compliance, 150 minutes of moderate intensity exercise per week. Discussed medication compliance, adverse effects. - atenolol (TENORMIN) 25 MG tablet; Take 1 tablet (25 mg total) by mouth daily.  Dispense: 90 tablet; Refill: 1 - lisinopril (ZESTRIL) 10 MG tablet; Take 1 tablet (10 mg total) by mouth daily.  Dispense: 90 tablet; Refill: 1  3. H/O: CVA (cerebrovascular accident) With residual aphasia Due to recurrent strokes she remains on Plavix Risk factor modification Continue statin - clopidogrel (PLAVIX) 75 MG tablet; Take 1 tablet (75 mg total) by mouth daily.  Dispense: 90 tablet; Refill: 1 - atorvastatin (LIPITOR) 40 MG tablet; Take 1 tablet (40 mg total) by mouth daily.  Dispense: 90 tablet; Refill: 1  4. Musculoskeletal back pain Apply heat - diclofenac Sodium (VOLTAREN) 1 % GEL; Apply 4 g topically 4 (four) times daily as needed (arthritis pain).  Dispense: 100 g; Refill: 3  5. Smoking greater than 20 pack years She has quit smoking Meets criteria for lung cancer screening - CT CHEST LUNG CANCER SCREENING LOW DOSE WO CONTRAST; Future  6. Moderate vascular dementia without behavioral disturbance, psychotic disturbance, mood disturbance, or anxiety (HCC) Discussed caregiver support Stable follow-up with neurology    Meds ordered this encounter   Medications   atenolol (TENORMIN) 25 MG tablet    Sig: Take 1 tablet (25 mg total) by mouth daily.    Dispense:  90 tablet    Refill:  1   clopidogrel (PLAVIX) 75 MG tablet    Sig: Take 1 tablet (75 mg total) by mouth daily.    Dispense:  90 tablet    Refill:  1   diclofenac Sodium (VOLTAREN) 1 % GEL    Sig: Apply 4 g topically 4 (four) times daily as needed (arthritis pain).    Dispense:  100 g    Refill:  3   atorvastatin (LIPITOR) 40 MG tablet    Sig: Take 1 tablet (40 mg total) by mouth daily.    Dispense:  90 tablet    Refill:  1   lisinopril (ZESTRIL) 10 MG tablet    Sig: Take 1 tablet (10 mg total) by mouth daily.  Dispense:  90 tablet    Refill:  1   metFORMIN (GLUCOPHAGE) 500 MG tablet    Sig: Take 2 tablets (1,000 mg total) by mouth 2 (two) times daily with a meal. (Patient will need to make an office visit for next refill)    Dispense:  360 tablet    Refill:  1    Follow-up: Return in about 6 months (around 08/13/2023) for Chronic medical conditions.       Charlott Rakes, MD, FAAFP. [facility], [place]   02/10/2023, 2:43 PM

## 2023-02-10 NOTE — Patient Instructions (Signed)
Dementia Caregiver Guide Dementia is a term used to describe a number of symptoms that affect memory and thinking. The most common symptoms include: Memory loss. Trouble with language and communication. Trouble concentrating. Poor judgment and problems with reasoning. Wandering from home or public places. Extreme anxiety or depression. Being suspicious or having angry outbursts and accusations. Child-like behavior and language. Dementia can be frightening and confusing. And taking care of someone with dementia can be challenging. This guide provides tips to help you when providing care for a person with dementia. How to help manage lifestyle changes Dementia usually gets worse slowly over time. In the early stages, people with dementia can stay independent and safe with some help. In later stages, they need help with daily tasks such as dressing, grooming, and using the bathroom. There are actions you can take to help a person manage his or her life while living with this condition. Communicating When the person is talking or seems frustrated, make eye contact and hold the person's hand. Ask specific questions that need yes or no answers. Use simple words, short sentences, and a calm voice. Only give one direction at a time. When offering choices, limit the person to just one or two. Avoid correcting the person in a negative way. If the person is struggling to find the right words, gently try to help him or her. Preventing injury  Keep floors clear of clutter. Remove rugs, magazine racks, and floor lamps. Keep hallways well lit, especially at night. Put a handrail and nonslip mat in the bathtub or shower. Put childproof locks on cabinets that contain dangerous items, such as medicines, alcohol, guns, toxic cleaning items, sharp tools or utensils, matches, and lighters. For doors to the outside of the house, put the locks in places where the person cannot see or reach them easily. This will  help ensure that the person does not wander out of the house and get lost. Be prepared for emergencies. Keep a list of emergency phone numbers and addresses in a convenient area. Remove car keys and lock garage doors so that the person does not try to get in the car and drive. Have the person wear a bracelet that tracks locations and identifies the person as having memory problems. This should be worn at all times for safety. Helping with daily life  Keep the person on track with his or her routine. Try to identify areas where the person may need help. Be supportive, patient, calm, and encouraging. Gently remind the person that adjusting to changes takes time. Help with the tasks that the person has asked for help with. Keep the person involved in daily tasks and decisions as much as possible. Encourage conversation, but try not to get frustrated if the person struggles to find words or does not seem to appreciate your help. How to recognize stress Look for signs of stress in yourself and in the person you are caring for. If you notice signs of stress, take steps to manage it. Symptoms of stress include: Feeling anxious, irritable, frustrated, or angry. Denying that the person has dementia or that his or her symptoms will not improve. Feeling depressed, hopeless, or unappreciated. Difficulty sleeping. Difficulty concentrating. Developing stress-related health problems. Feeling like you have too little time for your own life. Follow these instructions at home: Take care of your health Make sure that you and the person you are caring for: Get regular sleep. Exercise regularly. Eat regular, nutritious meals. Take over-the-counter and prescription medicines only   as told by your health care providers. Drink enough fluid to keep your urine pale yellow. Attend all scheduled health care appointments.  General instructions Join a support group with others who are caregivers. Ask about  respite care resources. Respite care can provide short-term care for the person so that you can have a regular break from the stress of caregiving. Consider any safety risks and take steps to avoid them. Organize medicines in a pill box for each day of the week. Create a plan to handle any legal or financial matters. Get legal or financial advice if needed. Keep a calendar in a central location to remind the person of appointments or other activities. Where to find support: Many individuals and organizations offer support. These include: Support groups for people with dementia. Support groups for caregivers. Counselors or therapists. Home health care services. Adult day care centers. Where to find more information Centers for Disease Control and Prevention: www.cdc.gov Alzheimer's Association: www.alz.org Family Caregiver Alliance: www.caregiver.org Alzheimer's Foundation of America: www.alzfdn.org Contact a health care provider if: The person's health is rapidly getting worse. You are no longer able to care for the person. Caring for the person is affecting your physical and emotional health. You are feeling depressed or anxious about caring for the person. Get help right away if: The person threatens himself or herself, you, or anyone else. You feel depressed or sad, or feel that you want to harm yourself. If you ever feel like your loved one may hurt himself or herself or others, or if he or she shares thoughts about taking his or her own life, get help right away. You can go to your nearest emergency department or: Call your local emergency services (911 in the [place] Call a suicide crisis helpline, such as the National Suicide Prevention Lifeline at 1-800-273-8255 or 988 in the [place] This is open 24 hours a day in the [place] Text the Crisis Text Line at 741741 (in the [place] Summary Dementia is a term used to describe a number of symptoms that affect memory and thinking. Dementia  usually gets worse slowly over time. Take steps to reduce the person's risk of injury and to plan for future care. Caregivers need support, relief from caregiving, and time for their own lives. This information is not intended to replace advice given to you by your health care provider. Make sure you discuss any questions you have with your health care provider. Document Revised: 06/03/2021 Document Reviewed: 03/24/2020 Elsevier Patient Education  2023 Elsevier Inc.  

## 2023-02-11 LAB — CMP14+EGFR
ALT: 23 IU/L (ref 0–32)
AST: 22 IU/L (ref 0–40)
Albumin/Globulin Ratio: 1.8 (ref 1.2–2.2)
Albumin: 4.4 g/dL (ref 3.8–4.8)
Alkaline Phosphatase: 76 IU/L (ref 44–121)
BUN/Creatinine Ratio: 16 (ref 12–28)
BUN: 16 mg/dL (ref 8–27)
Bilirubin Total: 0.6 mg/dL (ref 0.0–1.2)
CO2: 21 mmol/L (ref 20–29)
Calcium: 9.3 mg/dL (ref 8.7–10.3)
Chloride: 105 mmol/L (ref 96–106)
Creatinine, Ser: 1.01 mg/dL — ABNORMAL HIGH (ref 0.57–1.00)
Globulin, Total: 2.5 g/dL (ref 1.5–4.5)
Glucose: 138 mg/dL — ABNORMAL HIGH (ref 70–99)
Potassium: 4.7 mmol/L (ref 3.5–5.2)
Sodium: 145 mmol/L — ABNORMAL HIGH (ref 134–144)
Total Protein: 6.9 g/dL (ref 6.0–8.5)
eGFR: 58 mL/min/{1.73_m2} — ABNORMAL LOW (ref >59)

## 2023-02-11 LAB — LP+NON-HDL CHOLESTEROL
Cholesterol, Total: 164 mg/dL (ref 100–199)
HDL: 55 mg/dL (ref >39)
LDL Chol Calc (NIH): 90 mg/dL (ref 0–99)
Total Non-HDL-Chol (LDL+VLDL): 109 mg/dL (ref 0–129)
Triglycerides: 107 mg/dL (ref 0–149)
VLDL Cholesterol Cal: 19 mg/dL (ref 5–40)

## 2023-02-13 LAB — MICROALBUMIN / CREATININE URINE RATIO
Creatinine, Urine: 142 mg/dL
Microalb/Creat Ratio: 6 mg/g creat (ref 0–29)
Microalbumin, Urine: 8.3 ug/mL

## 2023-02-17 ENCOUNTER — Other Ambulatory Visit: Payer: Self-pay

## 2023-03-04 ENCOUNTER — Other Ambulatory Visit: Payer: Self-pay

## 2023-03-14 ENCOUNTER — Inpatient Hospital Stay: Admission: RE | Admit: 2023-03-14 | Payer: Medicare Other | Source: Ambulatory Visit

## 2023-03-18 ENCOUNTER — Other Ambulatory Visit: Payer: Self-pay

## 2023-03-24 ENCOUNTER — Telehealth: Payer: Self-pay | Admitting: Family Medicine

## 2023-03-24 NOTE — Telephone Encounter (Signed)
Copied from CRM [id number]. Topic: Medicare AWV >> Mar 24, 2023  3:21 PM Rushie Goltz wrote: Reason for CRM: Called patient to schedule Medicare Annual Wellness Visit (AWV). Left message for patient to call back and schedule Medicare Annual Wellness Visit (AWV).  Last date of AWV: AWVI eligible as of 07/23/20  Please schedule an AWVI appointment at any time with [facility] VISIT.  If any questions, please contact me at [phone].    Thank you,  [facility] Support [facility] Direct dial  [phone]

## 2023-04-21 ENCOUNTER — Inpatient Hospital Stay: Admission: RE | Admit: 2023-04-21 | Payer: Medicare Other | Source: Ambulatory Visit

## 2023-05-12 ENCOUNTER — Other Ambulatory Visit: Payer: Self-pay

## 2023-05-13 ENCOUNTER — Other Ambulatory Visit: Payer: Self-pay

## 2023-05-16 ENCOUNTER — Other Ambulatory Visit: Payer: Self-pay

## 2023-06-15 ENCOUNTER — Ambulatory Visit: Payer: Medicare Other | Attending: Family Medicine

## 2023-06-24 ENCOUNTER — Other Ambulatory Visit: Payer: Self-pay

## 2023-07-05 ENCOUNTER — Ambulatory Visit: Payer: Medicare Other | Attending: Family Medicine

## 2023-07-05 VITALS — Ht 63.0 in | Wt 184.0 lb

## 2023-07-05 DIAGNOSIS — Z0000 Encounter for general adult medical examination without abnormal findings: Secondary | ICD-10-CM

## 2023-07-05 NOTE — Patient Instructions (Addendum)
[name] , Thank you for taking time to come for your Medicare Wellness Visit. I appreciate your ongoing commitment to your health goals. Please review the following plan we discussed and let me know if I can assist you in the future.   Referrals/Orders/Follow-Ups/Clinician Recommendations: Aim for 30 minutes of exercise or brisk walking, 6-8 glasses of water, and 5 servings of fruits and vegetables each day.  This is a list of the screening recommended for you and due dates:  Health Maintenance  Topic Date Due   Eye exam for diabetics  Never done   DTaP/Tdap/Td vaccine (1 - Tdap) Never done   Screening for Lung Cancer  07/24/2010   COVID-19 Vaccine (1 - 2023-24 season) Never done   Zoster (Shingles) Vaccine (2 of 2) 10/07/2022   Flu Shot  06/23/2023   Pneumonia Vaccine (1 of 1 - PCV) 08/13/2023*   Complete foot exam   08/13/2023   Hemoglobin A1C  08/13/2023   Yearly kidney function blood test for diabetes  02/10/2024   Yearly kidney health urinalysis for diabetes  02/10/2024   Medicare Annual Wellness Visit  07/04/2024   Cologuard (Stool DNA test)  08/25/2025   DEXA scan (bone density measurement)  Completed   Hepatitis C Screening  Completed   HPV Vaccine  Aged Out  *Topic was postponed. The date shown is not the original due date.    Advanced directives: (ACP Link)Information on Advanced Care Planning can be found at [facility] Advance Health Care Directives Advance Health Care Directives ([email or web address]   Next Medicare Annual Wellness Visit scheduled for next year: Yes  Preventive Care 65 Years and Older, Female Preventive care refers to lifestyle choices and visits with your health care provider that can promote health and wellness. What does preventive care include? A yearly physical exam. This is also called an annual well check. Dental exams once or twice a year. Routine eye exams. Ask your health care provider how often you should have your eyes  checked. Personal lifestyle choices, including: Daily care of your teeth and gums. Regular physical activity. Eating a healthy diet. Avoiding tobacco and drug use. Limiting alcohol use. Practicing safe sex. Taking low-dose aspirin every day. Taking vitamin and mineral supplements as recommended by your health care provider. What happens during an annual well check? The services and screenings done by your health care provider during your annual well check will depend on your age, overall health, lifestyle risk factors, and family history of disease. Counseling  Your health care provider may ask you questions about your: Alcohol use. Tobacco use. Drug use. Emotional well-being. Home and relationship well-being. Sexual activity. Eating habits. History of falls. Memory and ability to understand (cognition). Work and work Astronomer. Reproductive health. Screening  You may have the following tests or measurements: Height, weight, and BMI. Blood pressure. Lipid and cholesterol levels. These may be checked every 5 years, or more frequently if you are over 78 years old. Skin check. Lung cancer screening. You may have this screening every year starting at age 12 if you have a 30-pack-year history of smoking and currently smoke or have quit within the past 15 years. Fecal occult blood test (FOBT) of the stool. You may have this test every year starting at age 73. Flexible sigmoidoscopy or colonoscopy. You may have a sigmoidoscopy every 5 years or a colonoscopy every 10 years starting at age 78. Hepatitis C blood test. Hepatitis B blood test. Sexually transmitted disease (STD) testing. Diabetes  screening. This is done by checking your blood sugar (glucose) after you have not eaten for a while (fasting). You may have this done every 1-3 years. Bone density scan. This is done to screen for osteoporosis. You may have this done starting at age 81. Mammogram. This may be done every 1-2  years. Talk to your health care provider about how often you should have regular mammograms. Talk with your health care provider about your test results, treatment options, and if necessary, the need for more tests. Vaccines  Your health care provider may recommend certain vaccines, such as: Influenza vaccine. This is recommended every year. Tetanus, diphtheria, and acellular pertussis (Tdap, Td) vaccine. You may need a Td booster every 10 years. Zoster vaccine. You may need this after age 8. Pneumococcal 13-valent conjugate (PCV13) vaccine. One dose is recommended after age 13. Pneumococcal polysaccharide (PPSV23) vaccine. One dose is recommended after age 17. Talk to your health care provider about which screenings and vaccines you need and how often you need them. This information is not intended to replace advice given to you by your health care provider. Make sure you discuss any questions you have with your health care provider. Document Released: 12/05/2015 Document Revised: 07/28/2016 Document Reviewed: 09/09/2015 Elsevier Interactive Patient Education  2017 [employer].  Fall Prevention in the Home Falls can cause injuries. They can happen to people of all ages. There are many things you can do to make your home safe and to help prevent falls. What can I do on the outside of my home? Regularly fix the edges of walkways and driveways and fix any cracks. Remove anything that might make you trip as you walk through a door, such as a raised step or threshold. Trim any bushes or trees on the path to your home. Use bright outdoor lighting. Clear any walking paths of anything that might make someone trip, such as rocks or tools. Regularly check to see if handrails are loose or broken. Make sure that both sides of any steps have handrails. Any raised decks and porches should have guardrails on the edges. Have any leaves, snow, or ice cleared regularly. Use sand or salt on walking paths  during winter. Clean up any spills in your garage right away. This includes oil or grease spills. What can I do in the bathroom? Use night lights. Install grab bars by the toilet and in the tub and shower. Do not use towel bars as grab bars. Use non-skid mats or decals in the tub or shower. If you need to sit down in the shower, use a plastic, non-slip stool. Keep the floor dry. Clean up any water that spills on the floor as soon as it happens. Remove soap buildup in the tub or shower regularly. Attach bath mats securely with double-sided non-slip rug tape. Do not have throw rugs and other things on the floor that can make you trip. What can I do in the bedroom? Use night lights. Make sure that you have a light by your bed that is easy to reach. Do not use any sheets or blankets that are too big for your bed. They should not hang down onto the floor. Have a firm chair that has side arms. You can use this for support while you get dressed. Do not have throw rugs and other things on the floor that can make you trip. What can I do in the kitchen? Clean up any spills right away. Avoid walking on wet floors. Keep  items that you use a lot in easy-to-reach places. If you need to reach something above you, use a strong step stool that has a grab bar. Keep electrical cords out of the way. Do not use floor polish or wax that makes floors slippery. If you must use wax, use non-skid floor wax. Do not have throw rugs and other things on the floor that can make you trip. What can I do with my stairs? Do not leave any items on the stairs. Make sure that there are handrails on both sides of the stairs and use them. Fix handrails that are broken or loose. Make sure that handrails are as long as the stairways. Check any carpeting to make sure that it is firmly attached to the stairs. Fix any carpet that is loose or worn. Avoid having throw rugs at the top or bottom of the stairs. If you do have throw  rugs, attach them to the floor with carpet tape. Make sure that you have a light switch at the top of the stairs and the bottom of the stairs. If you do not have them, ask someone to add them for you. What else can I do to help prevent falls? Wear shoes that: Do not have high heels. Have rubber bottoms. Are comfortable and fit you well. Are closed at the toe. Do not wear sandals. If you use a stepladder: Make sure that it is fully opened. Do not climb a closed stepladder. Make sure that both sides of the stepladder are locked into place. Ask someone to hold it for you, if possible. Clearly mark and make sure that you can see: Any grab bars or handrails. First and last steps. Where the edge of each step is. Use tools that help you move around (mobility aids) if they are needed. These include: Canes. Walkers. Scooters. Crutches. Turn on the lights when you go into a dark area. Replace any light bulbs as soon as they burn out. Set up your furniture so you have a clear path. Avoid moving your furniture around. If any of your floors are uneven, fix them. If there are any pets around you, be aware of where they are. Review your medicines with your doctor. Some medicines can make you feel dizzy. This can increase your chance of falling. Ask your doctor what other things that you can do to help prevent falls. This information is not intended to replace advice given to you by your health care provider. Make sure you discuss any questions you have with your health care provider. Document Released: 09/04/2009 Document Revised: 04/15/2016 Document Reviewed: 12/13/2014 Elsevier Interactive Patient Education  2017 [employer].

## 2023-07-05 NOTE — Progress Notes (Signed)
Subjective:   [name] is a 77 y.o. female who presents for an Initial Medicare Annual Wellness Visit.  Visit Complete: Virtual  I connected with  [name] on 07/05/23 by a audio enabled telemedicine application and verified that I am speaking with the correct person using two identifiers.  Patient Location: Home  Provider Location: Home Office  I discussed the limitations of evaluation and management by telemedicine. The patient expressed understanding and agreed to proceed.  Vital Signs: Unable to obtain new vitals due to this being a telehealth visit.  Review of Systems     Cardiac Risk Factors include: advanced age (>55men, >4 women);diabetes mellitus;dyslipidemia;hypertension     Objective:    Today's Vitals   07/05/23 2048  Weight: 184 lb (83.5 kg)  Height: 5\' 3"  (1.6 m)   Body mass index is 32.59 kg/m.     07/05/2023    8:53 PM 05/28/2022   11:03 PM 03/22/2022   11:17 PM 02/19/2022   10:19 AM 11/06/2021    3:32 PM 11/04/2021    8:42 PM 03/13/2021    2:59 PM  Advanced Directives  Does Patient Have a Medical Advance Directive? No No No Yes Unable to assess, patient is non-responsive or altered mental status Unable to assess, patient is non-responsive or altered mental status No  Type of Advance Directive    Healthcare Power of Attorney     Does patient want to make changes to medical advance directive?    No - Patient declined     Copy of Healthcare Power of Attorney in Chart?    No - copy requested     Would patient like information on creating a medical advance directive? Yes (MAU/Ambulatory/Procedural Areas - Information given) No - Patient declined     No - Patient declined    Current Medications (verified) Outpatient Encounter Medications as of 07/05/2023  Medication Sig   acetaminophen (TYLENOL) 325 MG tablet Take 650 mg by mouth every 6 (six) hours as needed for pain or moderate pain.   atenolol (TENORMIN) 25 MG tablet Take 1 tablet (25  mg total) by mouth daily.   atorvastatin (LIPITOR) 40 MG tablet Take 1 tablet (40 mg total) by mouth daily.   Blood Glucose Monitoring Suppl (ACCU-CHEK AVIVA) device Use as instructed daily.   clopidogrel (PLAVIX) 75 MG tablet Take 1 tablet (75 mg total) by mouth daily.   diclofenac Sodium (VOLTAREN) 1 % GEL Apply 4 g topically 4 (four) times daily as needed (arthritis pain).   folic acid (FOLVITE) 1 MG tablet Take 1 tablet (1 mg total) by mouth daily.   glucose blood (TRUE METRIX BLOOD GLUCOSE TEST) test strip Use as instructed   Lacosamide 100 MG TABS Take 1 tablet (100 mg total) by mouth in the morning AND 1.5 tablets (150 mg total) at bedtime.   Lancets (ACCU-CHEK MULTICLIX) lancets Use as instructed   lisinopril (ZESTRIL) 10 MG tablet Take 1 tablet (10 mg total) by mouth daily.   metFORMIN (GLUCOPHAGE) 500 MG tablet Take 2 tablets (1,000 mg total) by mouth 2 (two) times daily with a meal. (Patient will need to make an office visit for next refill)   vitamin B-12 (CYANOCOBALAMIN) 1000 MCG tablet Take 1 tablet (1,000 mcg total) by mouth daily.   nitroGLYCERIN (NITROSTAT) 0.4 MG SL tablet Place 1 tablet (0.4 mg total) under the tongue every 5 (five) minutes as needed for chest pain.   No facility-administered encounter medications on file as of 07/05/2023.  Allergies (verified) Penicillins and Sulfa antibiotics   History: Past Medical History:  Diagnosis Date   AKI (acute kidney injury) 11/05/2021   AMS (altered mental status) 03/12/2021   Facial weakness 07/08/2014   Hyperlipidemia    Hypertension    Left leg weakness 07/08/2014   Osteoarthritis of hip 06/18/2021   Seizure 11/05/2021   STEMI (ST elevation myocardial infarction) 02/19/2022   Stroke    large left frontal lobe infarct with associated encephalomalacia. Additional redemonstrated infarcts in the right occipital lobe, left parietal lobe, posterior left temporal lobe, right cerebellum, and left greater than right basal  ganglia   Subdural hemorrhage 07/08/2014   Type II diabetes mellitus 07/09/2014   Vascular dementia 07/13/2022   Past Surgical History:  Procedure Laterality Date   CORONARY STENT INTERVENTION N/A 02/19/2022   Procedure: CORONARY STENT INTERVENTION;  Surgeon: Elder Negus, MD;  Location: [facility];  Service: Cardiovascular;  Laterality: N/A;   CORONARY/GRAFT ACUTE MI REVASCULARIZATION N/A 02/19/2022   Procedure: Coronary/Graft Acute MI Revascularization;  Surgeon: Elder Negus, MD;  Location: [facility];  Service: Cardiovascular;  Laterality: N/A;   LEFT HEART CATH AND CORONARY ANGIOGRAPHY N/A 02/19/2022   Procedure: LEFT HEART CATH AND CORONARY ANGIOGRAPHY;  Surgeon: Elder Negus, MD;  Location: [facility];  Service: Cardiovascular;  Laterality: N/A;   NO PAST SURGERIES     Family History  Problem Relation Age of Onset   Hyperlipidemia Mother    Hypertension Mother    Diabetes Mother    Stroke Father    Social History   Socioeconomic History   Marital status: Widowed    Spouse name: Not on file   Number of children: 3   Years of education: 12   Highest education level: High school graduate  Occupational History   Occupation: Retired  Tobacco Use   Smoking status: Former    Current packs/day: 0.00    Average packs/day: 1 pack/day for 48.0 years (48.0 ttl pk-yrs)    Types: Cigarettes    Start date: 79    Quit date: 2023    Years since quitting: 1.6   Smokeless tobacco: Never  Vaping Use   Vaping status: Never Used  Substance and Sexual Activity   Alcohol use: No   Drug use: No   Sexual activity: Not on file  Other Topics Concern   Not on file  Social History Narrative   Lives with daughter, [name], and her grandchildren.   Right-handed.   Three cups caffeine daily.   Social Determinants of Health   Financial Resource Strain: Low Risk  (07/05/2023)   Overall Financial Resource Strain (CARDIA)    Difficulty of  Paying Living Expenses: Not hard at all  Food Insecurity: No Food Insecurity (07/05/2023)   Hunger Vital Sign    Worried About Running Out of Food in the Last Year: Never true    Ran Out of Food in the Last Year: Never true  Transportation Needs: No Transportation Needs (07/05/2023)   PRAPARE - Administrator, Civil Service (Medical): No    Lack of Transportation (Non-Medical): No  Physical Activity: Inactive (07/05/2023)   Exercise Vital Sign    Days of Exercise per Week: 0 days    Minutes of Exercise per Session: 0 min  Stress: No Stress Concern Present (07/05/2023)   [employer] of Occupational Health - Occupational Stress Questionnaire    Feeling of Stress : Not at all  Social Connections: Moderately  Isolated (07/05/2023)   Social Connection and Isolation Panel [NHANES]    Frequency of Communication with Friends and Family: More than three times a week    Frequency of Social Gatherings with Friends and Family: Three times a week    Attends Religious Services: 1 to 4 times per year    Active Member of Clubs or Organizations: No    Attends Banker Meetings: Never    Marital Status: Widowed    Tobacco Counseling Counseling given: Not Answered   Clinical Intake:  Pre-visit preparation completed: Yes  Pain : No/denies pain     Diabetes: Yes CBG done?: No Did pt. bring in CBG monitor from home?: No  How often do you need to have someone help you when you read instructions, pamphlets, or other written materials from your doctor or pharmacy?: 1 - Never  Interpreter Needed?: No  Comments: Assisted with visit by daughter=-Brenda Information entered by :: Kandis Fantasia LPN   Activities of Daily Living    07/05/2023    8:52 PM  In your present state of health, do you have any difficulty performing the following activities:  Hearing? 0  Vision? 0  Difficulty concentrating or making decisions? 1  Walking or climbing stairs? 0  Dressing or  bathing? 0  Doing errands, shopping? 0  Preparing Food and eating ? N  Using the Toilet? N  In the past six months, have you accidently leaked urine? N  Do you have problems with loss of bowel control? N  Managing your Medications? Y  Managing your Finances? Y  Housekeeping or managing your Housekeeping? Y    Patient Care Team: Hoy Register, MD as PCP - General (Family Medicine)  Indicate any recent Medical Services you may have received from other than [facility] providers in the past year (date may be approximate).     Assessment:   This is a routine wellness examination for Tanaysia.  Hearing/Vision screen Hearing Screening - Comments:: Denies hearing difficulties   Vision Screening - Comments:: No vision problems; will schedule routine eye exam soon    Dietary issues and exercise activities discussed:     Goals Addressed             This Visit's Progress    Remain active        Depression Screen    07/05/2023    8:50 PM 02/10/2023    9:38 AM 08/12/2022    8:57 AM 04/09/2021   11:34 AM 01/01/2021    4:13 PM 01/09/2020    2:14 PM  PHQ 2/9 Scores  PHQ - 2 Score 0 0 0 0 0 0  PHQ- 9 Score  6 3 2   0    Fall Risk    07/05/2023    8:52 PM 08/12/2022    8:46 AM 01/01/2021    4:13 PM 01/09/2020    2:01 PM  Fall Risk   Falls in the past year? 0 0 0 0  Number falls in past yr: 0 0 0   Injury with Fall? 0 0 0   Risk for fall due to : Impaired mobility No Fall Risks No Fall Risks   Follow up Falls prevention discussed;Education provided;Falls evaluation completed  Falls evaluation completed     MEDICARE RISK AT HOME:  Medicare Risk at Home - 07/05/23 2052     Any stairs in or around the home? No    If so, are there any without handrails? No    Home free  of loose throw rugs in walkways, pet beds, electrical cords, etc? Yes    Adequate lighting in your home to reduce risk of falls? Yes    Life alert? No    Use of a cane, walker or w/c? No    Grab bars in the  bathroom? Yes    Shower chair or bench in shower? No    Elevated toilet seat or a handicapped toilet? Yes             TIMED UP AND GO:  Was the test performed? No    Cognitive Function:        07/05/2023    8:52 PM  6CIT Screen  What Year? 0 points  What month? 0 points  What time? 0 points  Count back from 20 2 points  Months in reverse 4 points  Repeat phrase 4 points  Total Score 10 points    Immunizations Immunization History  Administered Date(s) Administered   Zoster Recombinant(Shingrix) 08/12/2022    TDAP status: Due, Education has been provided regarding the importance of this vaccine. Advised may receive this vaccine at local pharmacy or Health Dept. Aware to provide a copy of the vaccination record if obtained from local pharmacy or Health Dept. Verbalized acceptance and understanding.  Flu Vaccine status: Declined, Education has been provided regarding the importance of this vaccine but patient still declined. Advised may receive this vaccine at local pharmacy or Health Dept. Aware to provide a copy of the vaccination record if obtained from local pharmacy or Health Dept. Verbalized acceptance and understanding.  Pneumococcal vaccine status: Declined,  Education has been provided regarding the importance of this vaccine but patient still declined. Advised may receive this vaccine at local pharmacy or Health Dept. Aware to provide a copy of the vaccination record if obtained from local pharmacy or Health Dept. Verbalized acceptance and understanding.   Covid-19 vaccine status: Declined, Education has been provided regarding the importance of this vaccine but patient still declined. Advised may receive this vaccine at local pharmacy or Health Dept.or vaccine clinic. Aware to provide a copy of the vaccination record if obtained from local pharmacy or Health Dept. Verbalized acceptance and understanding.  Qualifies for Shingles Vaccine? Yes   Zostavax completed No    Shingrix Completed?: No.    Education has been provided regarding the importance of this vaccine. Patient has been advised to call insurance company to determine out of pocket expense if they have not yet received this vaccine. Advised may also receive vaccine at local pharmacy or Health Dept. Verbalized acceptance and understanding.  Screening Tests Health Maintenance  Topic Date Due   OPHTHALMOLOGY EXAM  Never done   DTaP/Tdap/Td (1 - Tdap) Never done   Lung Cancer Screening  07/24/2010   COVID-19 Vaccine (1 - 2023-24 season) 07/21/2023 (Originally 07/23/2022)   Pneumonia Vaccine 54+ Years old (1 of 1 - PCV) 08/13/2023 (Originally 11/12/2012)   Zoster Vaccines- Shingrix (2 of 2) 10/05/2023 (Originally 10/07/2022)   INFLUENZA VACCINE  02/20/2024 (Originally 06/23/2023)   FOOT EXAM  08/13/2023   HEMOGLOBIN A1C  08/13/2023   Diabetic kidney evaluation - eGFR measurement  02/10/2024   Diabetic kidney evaluation - Urine ACR  02/10/2024   Medicare Annual Wellness (AWV)  07/04/2024   Fecal DNA (Cologuard)  08/25/2025   DEXA SCAN  Completed   Hepatitis C Screening  Completed   HPV VACCINES  Aged Out    Health Maintenance  Health Maintenance Due  Topic Date Due  OPHTHALMOLOGY EXAM  Never done   DTaP/Tdap/Td (1 - Tdap) Never done   Lung Cancer Screening  07/24/2010    Colorectal cancer screening: Type of screening: Cologuard. Completed 08/25/22. Repeat every 3 years  Mammogram status: No longer required due to age and preference.  Bone Density status: Completed 12/25/15. Results reflect: Bone density results: NORMAL. Repeat every - years.  Lung Cancer Screening: (Low Dose CT Chest recommended if Age 42-80 years, 20 pack-year currently smoking OR have quit w/in 15years.) does qualify.   Lung Cancer Screening Referral: patient declines  Additional Screening:  Hepatitis C Screening: does qualify; Completed 12/24/10  Vision Screening: Recommended annual ophthalmology exams for early  detection of glaucoma and other disorders of the eye. Is the patient up to date with their annual eye exam?  No  Who is the provider or what is the name of the office in which the patient attends annual eye exams? none If pt is not established with a provider, would they like to be referred to a provider to establish care? No .   Dental Screening: Recommended annual dental exams for proper oral hygiene  Diabetic Foot Exam: Diabetic Foot Exam: Completed 08/12/22  Community Resource Referral / Chronic Care Management: CRR required this visit?  No   CCM required this visit?  No     Plan:     I have personally reviewed and noted the following in the patient's chart:   Medical and social history Use of alcohol, tobacco or illicit drugs  Current medications and supplements including opioid prescriptions. Patient is not currently taking opioid prescriptions. Functional ability and status Nutritional status Physical activity Advanced directives List of other physicians Hospitalizations, surgeries, and ER visits in previous 12 months Vitals Screenings to include cognitive, depression, and falls Referrals and appointments  In addition, I have reviewed and discussed with patient certain preventive protocols, quality metrics, and best practice recommendations. A written personalized care plan for preventive services as well as general preventive health recommendations were provided to patient.     Kandis Fantasia [place], [place]   1/61/0960   After Visit Summary: (Mail) Due to this being a telephonic visit, the after visit summary with patients personalized plan was offered to patient via mail   Nurse Notes: No concerns at this time

## 2023-08-01 ENCOUNTER — Other Ambulatory Visit: Payer: Self-pay

## 2023-08-04 ENCOUNTER — Other Ambulatory Visit: Payer: Self-pay

## 2023-08-11 ENCOUNTER — Other Ambulatory Visit: Payer: Self-pay

## 2023-08-11 ENCOUNTER — Ambulatory Visit: Payer: Medicare Other | Attending: Family Medicine | Admitting: Family Medicine

## 2023-08-11 ENCOUNTER — Encounter: Payer: Self-pay | Admitting: Family Medicine

## 2023-08-11 VITALS — BP 123/74 | HR 79 | Ht 63.0 in | Wt 184.4 lb

## 2023-08-11 DIAGNOSIS — I152 Hypertension secondary to endocrine disorders: Secondary | ICD-10-CM | POA: Insufficient documentation

## 2023-08-11 DIAGNOSIS — I2510 Atherosclerotic heart disease of native coronary artery without angina pectoris: Secondary | ICD-10-CM | POA: Insufficient documentation

## 2023-08-11 DIAGNOSIS — E1169 Type 2 diabetes mellitus with other specified complication: Secondary | ICD-10-CM | POA: Insufficient documentation

## 2023-08-11 DIAGNOSIS — R0602 Shortness of breath: Secondary | ICD-10-CM | POA: Insufficient documentation

## 2023-08-11 DIAGNOSIS — G40909 Epilepsy, unspecified, not intractable, without status epilepticus: Secondary | ICD-10-CM | POA: Insufficient documentation

## 2023-08-11 DIAGNOSIS — Z955 Presence of coronary angioplasty implant and graft: Secondary | ICD-10-CM | POA: Insufficient documentation

## 2023-08-11 DIAGNOSIS — R0609 Other forms of dyspnea: Secondary | ICD-10-CM | POA: Insufficient documentation

## 2023-08-11 DIAGNOSIS — F0150 Vascular dementia without behavioral disturbance: Secondary | ICD-10-CM | POA: Insufficient documentation

## 2023-08-11 DIAGNOSIS — Z8673 Personal history of transient ischemic attack (TIA), and cerebral infarction without residual deficits: Secondary | ICD-10-CM | POA: Insufficient documentation

## 2023-08-11 DIAGNOSIS — Z79899 Other long term (current) drug therapy: Secondary | ICD-10-CM | POA: Insufficient documentation

## 2023-08-11 DIAGNOSIS — Z87891 Personal history of nicotine dependence: Secondary | ICD-10-CM | POA: Insufficient documentation

## 2023-08-11 DIAGNOSIS — R5383 Other fatigue: Secondary | ICD-10-CM | POA: Insufficient documentation

## 2023-08-11 DIAGNOSIS — Z7182 Exercise counseling: Secondary | ICD-10-CM | POA: Insufficient documentation

## 2023-08-11 DIAGNOSIS — Z7902 Long term (current) use of antithrombotics/antiplatelets: Secondary | ICD-10-CM | POA: Insufficient documentation

## 2023-08-11 DIAGNOSIS — R471 Dysarthria and anarthria: Secondary | ICD-10-CM | POA: Insufficient documentation

## 2023-08-11 DIAGNOSIS — Z7984 Long term (current) use of oral hypoglycemic drugs: Secondary | ICD-10-CM | POA: Insufficient documentation

## 2023-08-11 DIAGNOSIS — E1159 Type 2 diabetes mellitus with other circulatory complications: Secondary | ICD-10-CM | POA: Insufficient documentation

## 2023-08-11 DIAGNOSIS — I69322 Dysarthria following cerebral infarction: Secondary | ICD-10-CM | POA: Insufficient documentation

## 2023-08-11 LAB — POCT GLYCOSYLATED HEMOGLOBIN (HGB A1C): HbA1c, POC (controlled diabetic range): 6.9 % (ref 0.0–7.0)

## 2023-08-11 MED ORDER — LISINOPRIL 10 MG PO TABS
10.0000 mg | ORAL_TABLET | Freq: Every day | ORAL | 1 refills | Status: DC
Start: 2023-08-11 — End: 2023-10-13
  Filled 2023-08-11: qty 90, 90d supply, fill #0

## 2023-08-11 MED ORDER — CLOPIDOGREL BISULFATE 75 MG PO TABS
75.0000 mg | ORAL_TABLET | Freq: Every day | ORAL | 1 refills | Status: DC
  Filled 2023-08-11: qty 90, 90d supply, fill #0

## 2023-08-11 MED ORDER — METFORMIN HCL 500 MG PO TABS
1000.0000 mg | ORAL_TABLET | Freq: Two times a day (BID) | ORAL | 1 refills | Status: DC
  Filled 2023-08-11 – 2023-10-19 (×2): qty 360, 90d supply, fill #0
  Filled 2024-01-24: qty 360, 90d supply, fill #1
  Filled 2024-01-24: qty 120, 30d supply, fill #1
  Filled 2024-02-27: qty 120, 30d supply, fill #2
  Filled 2024-03-30: qty 120, 30d supply, fill #3

## 2023-08-11 MED ORDER — ATORVASTATIN CALCIUM 40 MG PO TABS
40.0000 mg | ORAL_TABLET | Freq: Every day | ORAL | 1 refills | Status: DC
  Filled 2023-08-11: qty 90, 90d supply, fill #0

## 2023-08-11 MED ORDER — ATENOLOL 25 MG PO TABS
25.0000 mg | ORAL_TABLET | Freq: Every day | ORAL | 1 refills | Status: DC
  Filled 2023-08-11: qty 90, 90d supply, fill #0

## 2023-08-11 NOTE — Progress Notes (Signed)
Subjective:  Patient ID: [name], female    DOB: Aug 17, 1946  Age: 77 y.o. MRN: [id number]  CC: Medical Management of Chronic Issues   HPI [name] is a 77 y.o. year old female with a history of type 2 diabetes mellitus (A1C dyspnea 6.9), hypertension, previous multiple CVAs with residual dysarthria, CAD (status post PCI to mid LCx in 01/2022), seizures, vascular dementia  Quit smoking in 2023 (smoked 1 pack a day since x 60 years).  Interval History: Discussed the use of AI scribe software for clinical note transcription with the patient, who gave verbal consent to proceed.  She presents for a routine follow-up. She reports daily exercise, walking approximately fifty steps twice a day to take out the trash. She gets tired easily and becomes 'winded with exertion'. She denies chest pain but reports occasional shortness of breath. She has no orthopnea. I had referred her for CT low-dose lung cancer screen but daughter states they opted against this as they felt this was not indicated due to her age and the fact that she quit smoking. She is on atenolol and lisinopril for hypertension, metformin for diabetes, and Plavix for secondary stroke prevention. She also takes lacosamide for seizure control, which daughter adjusted to one tablet in the morning and half a tablet at night to prevent excessive sedation and cognitive impairment.  She remains under neurology care.       Past Medical History:  Diagnosis Date   AKI (acute kidney injury) 11/05/2021   AMS (altered mental status) 03/12/2021   Facial weakness 07/08/2014   Hyperlipidemia    Hypertension    Left leg weakness 07/08/2014   Osteoarthritis of hip 06/18/2021   Seizure 11/05/2021   STEMI (ST elevation myocardial infarction) 02/19/2022   Stroke    large left frontal lobe infarct with associated encephalomalacia. Additional redemonstrated infarcts in the right occipital lobe, left parietal lobe, posterior left  temporal lobe, right cerebellum, and left greater than right basal ganglia   Subdural hemorrhage 07/08/2014   Type II diabetes mellitus 07/09/2014   Vascular dementia 07/13/2022    Past Surgical History:  Procedure Laterality Date   CORONARY STENT INTERVENTION N/A 02/19/2022   Procedure: CORONARY STENT INTERVENTION;  Surgeon: Elder Negus, MD;  Location: [facility];  Service: Cardiovascular;  Laterality: N/A;   CORONARY/GRAFT ACUTE MI REVASCULARIZATION N/A 02/19/2022   Procedure: Coronary/Graft Acute MI Revascularization;  Surgeon: Elder Negus, MD;  Location: [facility];  Service: Cardiovascular;  Laterality: N/A;   LEFT HEART CATH AND CORONARY ANGIOGRAPHY N/A 02/19/2022   Procedure: LEFT HEART CATH AND CORONARY ANGIOGRAPHY;  Surgeon: Elder Negus, MD;  Location: [facility];  Service: Cardiovascular;  Laterality: N/A;   NO PAST SURGERIES      Family History  Problem Relation Age of Onset   Hyperlipidemia Mother    Hypertension Mother    Diabetes Mother    Stroke Father     Social History   Socioeconomic History   Marital status: Widowed    Spouse name: Not on file   Number of children: 3   Years of education: 12   Highest education level: High school graduate  Occupational History   Occupation: Retired  Tobacco Use   Smoking status: Former    Current packs/day: 0.00    Average packs/day: 1 pack/day for 48.0 years (48.0 ttl pk-yrs)    Types: Cigarettes    Start date: 60    Quit date:  2023    Years since quitting: 1.7   Smokeless tobacco: Never  Vaping Use   Vaping status: Never Used  Substance and Sexual Activity   Alcohol use: No   Drug use: No   Sexual activity: Not on file  Other Topics Concern   Not on file  Social History Narrative   Lives with daughter, [name], and her grandchildren.   Right-handed.   Three cups caffeine daily.   Social Determinants of Health   Financial Resource Strain: Low Risk   (07/05/2023)   Overall Financial Resource Strain (CARDIA)    Difficulty of Paying Living Expenses: Not hard at all  Food Insecurity: No Food Insecurity (07/05/2023)   Hunger Vital Sign    Worried About Running Out of Food in the Last Year: Never true    Ran Out of Food in the Last Year: Never true  Transportation Needs: No Transportation Needs (07/05/2023)   PRAPARE - Administrator, Civil Service (Medical): No    Lack of Transportation (Non-Medical): No  Physical Activity: Inactive (07/05/2023)   Exercise Vital Sign    Days of Exercise per Week: 0 days    Minutes of Exercise per Session: 0 min  Stress: No Stress Concern Present (07/05/2023)   [employer] of Occupational Health - Occupational Stress Questionnaire    Feeling of Stress : Not at all  Social Connections: Moderately Isolated (07/05/2023)   Social Connection and Isolation Panel [NHANES]    Frequency of Communication with Friends and Family: More than three times a week    Frequency of Social Gatherings with Friends and Family: Three times a week    Attends Religious Services: 1 to 4 times per year    Active Member of Clubs or Organizations: No    Attends Banker Meetings: Never    Marital Status: Widowed    Allergies  Allergen Reactions   Penicillins Itching   Sulfa Antibiotics Other (See Comments)    Reaction not recalled, but patient was told she was allergic    Outpatient Medications Prior to Visit  Medication Sig Dispense Refill   acetaminophen (TYLENOL) 325 MG tablet Take 650 mg by mouth every 6 (six) hours as needed for pain or moderate pain.     Blood Glucose Monitoring Suppl (ACCU-CHEK AVIVA) device Use as instructed daily. 1 each 0   diclofenac Sodium (VOLTAREN) 1 % GEL Apply 4 g topically 4 (four) times daily as needed (arthritis pain). 100 g 3   folic acid (FOLVITE) 1 MG tablet Take 1 tablet (1 mg total) by mouth daily. 30 tablet 0   glucose blood (TRUE METRIX BLOOD GLUCOSE TEST)  test strip Use as instructed 100 each 6   Lacosamide 100 MG TABS Take 1 tablet (100 mg total) by mouth in the morning AND 1.5 tablets (150 mg total) at bedtime. 225 tablet 3   Lancets (ACCU-CHEK MULTICLIX) lancets Use as instructed 100 each 12   vitamin B-12 (CYANOCOBALAMIN) 1000 MCG tablet Take 1 tablet (1,000 mcg total) by mouth daily. 30 tablet 0   atenolol (TENORMIN) 25 MG tablet Take 1 tablet (25 mg total) by mouth daily. 90 tablet 1   atorvastatin (LIPITOR) 40 MG tablet Take 1 tablet (40 mg total) by mouth daily. 90 tablet 1   clopidogrel (PLAVIX) 75 MG tablet Take 1 tablet (75 mg total) by mouth daily. 90 tablet 1   lisinopril (ZESTRIL) 10 MG tablet Take 1 tablet (10 mg total) by mouth daily. 90 tablet 1  metFORMIN (GLUCOPHAGE) 500 MG tablet Take 2 tablets (1,000 mg total) by mouth 2 (two) times daily with a meal. (Patient will need to make an office visit for next refill) 360 tablet 1   nitroGLYCERIN (NITROSTAT) 0.4 MG SL tablet Place 1 tablet (0.4 mg total) under the tongue every 5 (five) minutes as needed for chest pain. 30 tablet 3   No facility-administered medications prior to visit.     ROS Review of Systems  Constitutional:  Negative for activity change and appetite change.  HENT:  Negative for sinus pressure and sore throat.   Respiratory:  Positive for shortness of breath. Negative for chest tightness and wheezing.   Cardiovascular:  Negative for chest pain and palpitations.  Gastrointestinal:  Negative for abdominal distention, abdominal pain and constipation.  Genitourinary: Negative.   Musculoskeletal: Negative.   Neurological:  Positive for speech difficulty.  Psychiatric/Behavioral:  Negative for behavioral problems and dysphoric mood.     Objective:  BP 123/74   Pulse 79   Ht 5\' 3"  (1.6 m)   Wt 184 lb 6.4 oz (83.6 kg)   LMP 09/28/2014 (Approximate)   SpO2 98%   BMI 32.66 kg/m      08/11/2023    9:50 AM 08/11/2023    9:29 AM 07/05/2023    8:48 PM   BP/Weight  Systolic BP 123 153 --  Diastolic BP 74 82 --  Wt. (Lbs)  184.4 184  BMI  32.66 kg/m2 32.59 kg/m2      Physical Exam Constitutional:      Appearance: She is well-developed.  Cardiovascular:     Rate and Rhythm: Normal rate.     Heart sounds: Normal heart sounds. No murmur heard. Pulmonary:     Effort: Pulmonary effort is normal.     Breath sounds: Normal breath sounds. No wheezing or rales.  Chest:     Chest wall: No tenderness.  Abdominal:     General: Bowel sounds are normal. There is no distension.     Palpations: Abdomen is soft. There is no mass.     Tenderness: There is no abdominal tenderness.  Musculoskeletal:        General: Normal range of motion.     Right lower leg: No edema.     Left lower leg: No edema.  Neurological:     Mental Status: She is alert and oriented to person, place, and time.  Psychiatric:        Mood and Affect: Mood normal.        Latest Ref Rng & Units 02/10/2023   10:30 AM 10/20/2022    2:21 PM 08/12/2022    9:21 AM  CMP  Glucose 70 - 99 mg/dL 528  413  244   BUN 8 - 27 mg/dL 16  11  16    Creatinine 0.57 - 1.00 mg/dL 0.10  2.72  5.36   Sodium 134 - 144 mmol/L 145  142  140   Potassium 3.5 - 5.2 mmol/L 4.7  4.9  4.8   Chloride 96 - 106 mmol/L 105  103  103   CO2 20 - 29 mmol/L 21  22  22    Calcium 8.7 - 10.3 mg/dL 9.3  9.7  9.5   Total Protein 6.0 - 8.5 g/dL 6.9  7.1    Total Bilirubin 0.0 - 1.2 mg/dL 0.6  0.5    Alkaline Phos 44 - 121 IU/L 76  76    AST 0 - 40 IU/L 22  20  ALT 0 - 32 IU/L 23  12      Lipid Panel     Component Value Date/Time   CHOL 164 02/10/2023 1030   TRIG 107 02/10/2023 1030   HDL 55 02/10/2023 1030   CHOLHDL 3.2 02/19/2022 0654   VLDL 30 02/19/2022 0654   LDLCALC 90 02/10/2023 1030    CBC    Component Value Date/Time   WBC 9.2 05/28/2022 2338   RBC 4.56 05/28/2022 2338   HGB 12.6 05/29/2022 0020   HGB 13.9 01/01/2021 1647   HCT 37.0 05/29/2022 0020   HCT 42.2 01/01/2021 1647    PLT 290 05/28/2022 2338   PLT 331 01/01/2021 1647   MCV 85.7 05/28/2022 2338   MCV 84 01/01/2021 1647   MCH 26.3 05/28/2022 2338   MCHC 30.7 05/28/2022 2338   RDW 14.2 05/28/2022 2338   RDW 14.1 01/01/2021 1647   LYMPHSABS 2.7 05/28/2022 2338   LYMPHSABS 3.1 01/01/2021 1647   MONOABS 0.5 05/28/2022 2338   EOSABS 0.1 05/28/2022 2338   EOSABS 0.1 01/01/2021 1647   BASOSABS 0.0 05/28/2022 2338   BASOSABS 0.1 01/01/2021 1647    Lab Results  Component Value Date   HGBA1C 6.9 08/11/2023    Assessment & Plan:      Hypertension associated with diabetes mellitus Elevated blood pressure likely due to missed morning dose of antihypertensive medications (Atenolol, Lisinopril). -Repeat blood pressure was normal -Continue current regimen of Atenolol and Lisinopril. -Recheck blood pressure after rest.  Type 2 Diabetes Mellitus Well-controlled with Metformin, recent A1c 6.9. -Continue Metformin. -Counseled on Diabetic diet, my plate method, 161 minutes of moderate intensity exercise/week Blood sugar logs with fasting goals of 80-120 mg/dl, random of less than 096 and in the event of sugars less than 60 mg/dl or greater than 045 mg/dl encouraged to notify the clinic. Advised on the need for annual eye exams, annual foot exams, Pneumonia vaccine.   History of Multiple Strokes With residual dysarthria On Plavix for secondary prevention. -Continue Plavix due to history of multiple strokes.  Seizure Disorder Well-controlled on Lacosamide (Vimpat) 1.5 tablets daily. -No recent seizures -Continue Lacosamide (Vimpat) 1 tablet in the morning and 0.5 tablet at night. -Follow-up with neurology  Dyspnea Reports of shortness of breath and fatigue with exertion. -Check BNP -Continue current medications and monitor symptoms.  General Health Maintenance -Declined lung cancer screening -Follow-up in 6 months.          Meds ordered this encounter  Medications   atenolol (TENORMIN) 25  MG tablet    Sig: Take 1 tablet (25 mg total) by mouth daily.    Dispense:  90 tablet    Refill:  1   atorvastatin (LIPITOR) 40 MG tablet    Sig: Take 1 tablet (40 mg total) by mouth daily.    Dispense:  90 tablet    Refill:  1   clopidogrel (PLAVIX) 75 MG tablet    Sig: Take 1 tablet (75 mg total) by mouth daily.    Dispense:  90 tablet    Refill:  1   lisinopril (ZESTRIL) 10 MG tablet    Sig: Take 1 tablet (10 mg total) by mouth daily.    Dispense:  90 tablet    Refill:  1   metFORMIN (GLUCOPHAGE) 500 MG tablet    Sig: Take 2 tablets (1,000 mg total) by mouth 2 (two) times daily with a meal. (Patient will need to make an office visit for next refill)    Dispense:  360 tablet    Refill:  1    Follow-up: Return in about 6 months (around 02/08/2024) for Chronic medical conditions.       Hoy Register, MD, FAAFP. [facility] [place], [place] [phone]   08/11/2023, 10:08 AM

## 2023-08-11 NOTE — Patient Instructions (Signed)
Exercising to Stay Healthy To become healthy and stay healthy, it is recommended that you do moderate-intensity and vigorous-intensity exercise. You can tell that you are exercising at a moderate intensity if your heart starts beating faster and you start breathing faster but can still hold a conversation. You can tell that you are exercising at a vigorous intensity if you are breathing much harder and faster and cannot hold a conversation while exercising. How can exercise benefit me? Exercising regularly is important. It has many health benefits, such as: Improving overall fitness, flexibility, and endurance. Increasing bone density. Helping with weight control. Decreasing body fat. Increasing muscle strength and endurance. Reducing stress and tension, anxiety, depression, or anger. Improving overall health. What guidelines should I follow while exercising? Before you start a new exercise program, talk with your health care provider. Do not exercise so much that you hurt yourself, feel dizzy, or get very short of breath. Wear comfortable clothes and wear shoes with good support. Drink plenty of water while you exercise to prevent dehydration or heat stroke. Work out until your breathing and your heartbeat get faster (moderate intensity). How often should I exercise? Choose an activity that you enjoy, and set realistic goals. Your health care provider can help you make an activity plan that is individually designed and works best for you. Exercise regularly as told by your health care provider. This may include: Doing strength training two times a week, such as: Lifting weights. Using resistance bands. Push-ups. Sit-ups. Yoga. Doing a certain intensity of exercise for a given amount of time. Choose from these options: A total of 150 minutes of moderate-intensity exercise every week. A total of 75 minutes of vigorous-intensity exercise every week. A mix of moderate-intensity and  vigorous-intensity exercise every week. Children, pregnant women, people who have not exercised regularly, people who are overweight, and older adults may need to talk with a health care provider about what activities are safe to perform. If you have a medical condition, be sure to talk with your health care provider before you start a new exercise program. What are some exercise ideas? Moderate-intensity exercise ideas include: Walking 1 mile (1.6 km) in about 15 minutes. Biking. Hiking. Golfing. Dancing. Water aerobics. Vigorous-intensity exercise ideas include: Walking 4.5 miles (7.2 km) or more in about 1 hour. Jogging or running 5 miles (8 km) in about 1 hour. Biking 10 miles (16.1 km) or more in about 1 hour. Lap swimming. Roller-skating or in-line skating. Cross-country skiing. Vigorous competitive sports, such as football, basketball, and soccer. Jumping rope. Aerobic dancing. What are some everyday activities that can help me get exercise? Yard work, such as: Pushing a lawn mower. Raking and bagging leaves. Washing your car. Pushing a stroller. Shoveling snow. Gardening. Washing windows or floors. How can I be more active in my day-to-day activities? Use stairs instead of an elevator. Take a walk during your lunch break. If you drive, park your car farther away from your work or school. If you take public transportation, get off one stop early and walk the rest of the way. Stand up or walk around during all of your indoor phone calls. Get up, stretch, and walk around every 30 minutes throughout the day. Enjoy exercise with a friend. Support to continue exercising will help you keep a regular routine of activity. Where to find more information You can find more information about exercising to stay healthy from: U.S. Department of Health and Human Services: www.hhs.gov Centers for Disease Control and Prevention (  CDC): www.cdc.gov Summary Exercising regularly is  important. It will improve your overall fitness, flexibility, and endurance. Regular exercise will also improve your overall health. It can help you control your weight, reduce stress, and improve your bone density. Do not exercise so much that you hurt yourself, feel dizzy, or get very short of breath. Before you start a new exercise program, talk with your health care provider. This information is not intended to replace advice given to you by your health care provider. Make sure you discuss any questions you have with your health care provider. Document Revised: 03/06/2021 Document Reviewed: 03/06/2021 Elsevier Patient Education  2024 Elsevier Inc.  

## 2023-08-12 ENCOUNTER — Other Ambulatory Visit: Payer: Self-pay

## 2023-08-12 ENCOUNTER — Other Ambulatory Visit: Payer: Self-pay | Admitting: Family Medicine

## 2023-08-12 DIAGNOSIS — R0609 Other forms of dyspnea: Secondary | ICD-10-CM

## 2023-08-12 LAB — BASIC METABOLIC PANEL
BUN/Creatinine Ratio: 12 (ref 12–28)
BUN: 12 mg/dL (ref 8–27)
CO2: 19 mmol/L — ABNORMAL LOW (ref 20–29)
Calcium: 9.1 mg/dL (ref 8.7–10.3)
Chloride: 102 mmol/L (ref 96–106)
Creatinine, Ser: 1.02 mg/dL — ABNORMAL HIGH (ref 0.57–1.00)
Glucose: 124 mg/dL — ABNORMAL HIGH (ref 70–99)
Potassium: 5.1 mmol/L (ref 3.5–5.2)
Sodium: 141 mmol/L (ref 134–144)
eGFR: 57 mL/min/{1.73_m2} — ABNORMAL LOW (ref >59)

## 2023-08-12 LAB — PRO B NATRIURETIC PEPTIDE: NT-Pro BNP: 739 pg/mL — ABNORMAL HIGH (ref 0–738)

## 2023-08-12 MED ORDER — FUROSEMIDE 20 MG PO TABS
20.0000 mg | ORAL_TABLET | Freq: Every day | ORAL | 5 refills | Status: DC
  Filled 2023-08-12: qty 30, 30d supply, fill #0

## 2023-09-26 ENCOUNTER — Encounter: Payer: Self-pay | Admitting: Cardiology

## 2023-09-26 NOTE — Progress Notes (Deleted)
Cardiology Office Note:  .   Date:  09/26/2023  ID:  [name], DOB Jun 15, 1946, MRN [id number] PCP: Hoy Register, MD  [facility] Providers Cardiologist:  None { Click to update primary MD,subspecialty MD or APP then REFRESH:1}   History of Present Illness: .   [name] is a 77 y.o. female   with hypertension, type 2 DM, CAD (STEMI and PPCI 01/2022), h/o stroke 2015 w/subdural hematoma, h/o seizures, dementia.  Patient last saw Dr. Rosemary Holms 11/29/22 and recommended stopping plavix 02/2023 but continuing ASA.  She saw her PCP recently and complained of DOE(quit smoking last yr and 60 pack years). BNP up 739.CT  lung cancer screening recommended but they declined given age and dementia. They recommended continue plavix with history of multiple strokes. Echo ordered but not done.     ROS: ***  Studies Reviewed: Marland Kitchen         Prior CV Studies: {Select studies to display:26339}  Echocardiogram 02/19/2021:  1. Left ventricular ejection fraction, by estimation, is 55 to 60%. The  left ventricle has normal function. The left ventricle has no regional  wall motion abnormalities. There is moderate left ventricular hypertrophy  of the basal-septal segment. Left  ventricular diastolic parameters are consistent with Grade I diastolic  dysfunction (impaired relaxation).   2. Right ventricular systolic function is normal. The right ventricular  size is normal.   3. The mitral valve is abnormal. Mild to moderate mitral valve  regurgitation. Severe mitral annular calcification.   4. The aortic valve is tricuspid. Aortic valve regurgitation is not  visualized.   Comparison(s): A prior study was performed on 03/13/2021. Which showed  trivial mitral regurgitation. no other significant change noted.    Coronary intervention 02/19/2022: LM: Normal LAD: Minimal luminal irregularities Lcx: Prox 50% disease (non-culprit)        Prox-mid 95% stenosis (culprit) RCA: Prox  20% disease   LVEDP 27 mmHg   Successful percutaneous coronary intervention mid LCx        PTCA and stent placement 3.0 X 20 mm Synergy drug-eluting stent        Post dilatation with 3.25X15 mm [place] balloon at 16 atm 95%-->0% stenosis at culprit lesion TIMI III flow         Risk Assessment/Calculations:   {Does this patient have ATRIAL FIBRILLATION?:[id number]} No BP recorded.  {Refresh Note OR Click here to enter BP  :1}***       Physical Exam:   VS:  LMP 09/28/2014 (Approximate)    Wt Readings from Last 3 Encounters:  08/11/23 184 lb 6.4 oz (83.6 kg)  07/05/23 184 lb (83.5 kg)  02/10/23 184 lb 12.8 oz (83.8 kg)    GEN: Well nourished, well developed in no acute distress NECK: No JVD; No carotid bruits CARDIAC: ***RRR, no murmurs, rubs, gallops RESPIRATORY:  Clear to auscultation without rales, wheezing or rhonchi  ABDOMEN: Soft, non-tender, non-distended EXTREMITIES:  No edema; No deformity   ASSESSMENT AND PLAN: .    CAD STEMI 02/2022 on DAPT with ASA and Plavix(could stop plavix for CAD per Dr. Rosemary Holms but remains on b/c of multiple strokes per PCP  DOE with elevated BNP>700. Echo ordered, lasix added Crt 1.02-baseline  HTN  CVA 2015 with subdural hematoma  DM2  Dementia     {Are you ordering a CV Procedure (e.g. stress test, cath, DCCV, TEE, etc)?   Press F2        :[id number]}  Dispo: ***  Signed, Jacolyn Reedy,  PA-C

## 2023-09-26 NOTE — Telephone Encounter (Signed)
Error

## 2023-09-27 ENCOUNTER — Ambulatory Visit: Payer: Medicare Other | Admitting: Physician Assistant

## 2023-09-27 DIAGNOSIS — I10 Essential (primary) hypertension: Secondary | ICD-10-CM

## 2023-09-27 DIAGNOSIS — R0609 Other forms of dyspnea: Secondary | ICD-10-CM

## 2023-09-27 DIAGNOSIS — F01B0 Vascular dementia, moderate, without behavioral disturbance, psychotic disturbance, mood disturbance, and anxiety: Secondary | ICD-10-CM

## 2023-09-27 DIAGNOSIS — I639 Cerebral infarction, unspecified: Secondary | ICD-10-CM

## 2023-09-27 DIAGNOSIS — E1159 Type 2 diabetes mellitus with other circulatory complications: Secondary | ICD-10-CM

## 2023-09-27 DIAGNOSIS — I2510 Atherosclerotic heart disease of native coronary artery without angina pectoris: Secondary | ICD-10-CM

## 2023-10-06 ENCOUNTER — Ambulatory Visit: Payer: Medicare Other | Admitting: Cardiology

## 2023-10-11 NOTE — Progress Notes (Unsigned)
Cardiology Office Note:  .   Date:  10/13/2023  ID:  [name], DOB 11/12/1946, MRN [id number] PCP: Hoy Register, MD  [facility] Providers Cardiologist:  Elder Negus, MD }   History of Present Illness: .   [name] is a 77 y.o. 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    Most recent echocardiogram 02/19/2021 revealed EF of 55 to 60% with grade 1 diastolic dysfunction.  Severe mitral annular calcification with mild to moderate, mitral valve regurg.    When last seen by cardiology, on 11/29/2022, the patient was to continue dual antiplatelet therapy with aspirin and Plavix until April 2024 at which point Plavix could be discontinued.   She comes today with her daughter.  The patient has vascular dementia and is very difficult for her to communicate.  Her main complaint is dyspnea on exertion.  She had to stop several times in order to walk to the elevator and into our clinic.  She denies chest pain or palpitations.  She is to have a repeat echo which had to be rescheduled and therefore this will be reordered.  ROS: As above otherwise negative  Studies Reviewed: Marland Kitchen   EKG Interpretation Date/Time:  Thursday October 13 2023 10:22:45 EST Ventricular Rate:  81 PR Interval:  152 QRS Duration:  82 QT Interval:  374 QTC Calculation: 434 R Axis:   69  Text Interpretation: Sinus rhythm with Premature supraventricular complexes When compared with ECG of 28-May-2022 21:23, PREVIOUS ECG IS PRESENT Confirmed by Joni Reining [phone]) on 10/13/2023 10:38:21 AM    LHC 02/19/2022 LM: Normal LAD: Minimal luminal irregularities Lcx: Prox 50% disease (non-culprit)        Prox-mid 95% stenosis (culprit) RCA: Prox 20% disease   LVEDP 27 mmHg   Successful percutaneous coronary intervention mid LCx        PTCA and stent placement 3.0  X 20 mm Synergy drug-eluting stent        Post dilatation with 3.25X15 mm [place] balloon at 16 atm 95%-->0% stenosis at culprit lesion TIMI III flow  Echocardiogram 02/19/2022 1. Left ventricular ejection fraction, by estimation, is 55 to 60%. The  left ventricle has normal function. The left ventricle has no regional  wall motion abnormalities. There is moderate left ventricular hypertrophy  of the basal-septal segment. Left  ventricular diastolic parameters are consistent with Grade I diastolic  dysfunction (impaired relaxation).   2. Right ventricular systolic function is normal. The right ventricular  size is normal.   3. The mitral valve is abnormal. Mild to moderate mitral valve  regurgitation. Severe mitral annular calcification.   4. The aortic valve is tricuspid. Aortic valve regurgitation is not  visualized.    Physical Exam:   VS:  BP 138/78   Pulse 81   Ht 5\' 3"  (1.6 m)   Wt 180 lb (81.6 kg)   LMP 09/28/2014 (Approximate)   BMI 31.89 kg/m    Wt Readings from Last 3 Encounters:  10/13/23 180 lb (81.6 kg)  08/11/23 184 lb 6.4 oz (83.6 kg)  07/05/23 184 lb (83.5 kg)    GEN: Well nourished, well developed in no acute distress NECK: No JVD; No carotid bruits CARDIAC: IRRR, no murmurs, rubs, gallops RESPIRATORY:  Clear to auscultation without rales, wheezing or rhonchi diminished in the bases. ABDOMEN: Soft, non-tender, non-distended EXTREMITIES:  No edema;  No deformity   ASSESSMENT AND PLAN: .    Mitral valve disease: Symptomatic with worsening dyspnea on exertion having to stop several times.  No associated shortness of breath or evidence of volume overload.  Echocardiogram revealed mild to moderate mitral valve regurg with severe mitral annular calcification.  Repeating echocardiogram.  Will likely need to refer to heart valve clinic since she is so symptomatic.  Will await echo for confirmation.  2.  Coronary artery disease: Status post PCI to the mid left circumflex  in March 2023.  She offers no complaints of chest pain her main complaint is dyspnea on exertion.  Will continue secondary prevention with blood pressure control, lipid management, she is unable to do purposeful exercise due to worsening shortness of breath.  3.  Hypercholesterolemia: Goal of LDL less than 70.  She is currently on atorvastatin 40 mg daily.  Most recent lipid profile in March 2024 LDL 99, total cholesterol 102.  Will need to repeat this for ongoing evaluation.   4.  History of vascular dementia: She is status post CVA with ongoing cognitive and speech difficulties.  Her daughter is very attentive and is helpful with history and symptoms.  Continue to follow with neurology.       Signed, Bettey Mare. Liborio Nixon, ANP, AACC

## 2023-10-13 ENCOUNTER — Other Ambulatory Visit: Payer: Self-pay

## 2023-10-13 ENCOUNTER — Encounter: Payer: Self-pay | Admitting: Adult Health

## 2023-10-13 ENCOUNTER — Other Ambulatory Visit (HOSPITAL_BASED_OUTPATIENT_CLINIC_OR_DEPARTMENT_OTHER): Payer: Self-pay

## 2023-10-13 ENCOUNTER — Ambulatory Visit: Payer: Medicare Other | Attending: Adult Health | Admitting: Adult Health

## 2023-10-13 VITALS — BP 138/78 | HR 81 | Ht 63.0 in | Wt 180.0 lb

## 2023-10-13 DIAGNOSIS — I059 Rheumatic mitral valve disease, unspecified: Secondary | ICD-10-CM | POA: Diagnosis present

## 2023-10-13 DIAGNOSIS — I2510 Atherosclerotic heart disease of native coronary artery without angina pectoris: Secondary | ICD-10-CM | POA: Diagnosis present

## 2023-10-13 DIAGNOSIS — Z8673 Personal history of transient ischemic attack (TIA), and cerebral infarction without residual deficits: Secondary | ICD-10-CM | POA: Diagnosis present

## 2023-10-13 DIAGNOSIS — E1159 Type 2 diabetes mellitus with other circulatory complications: Secondary | ICD-10-CM | POA: Insufficient documentation

## 2023-10-13 DIAGNOSIS — I152 Hypertension secondary to endocrine disorders: Secondary | ICD-10-CM | POA: Insufficient documentation

## 2023-10-13 DIAGNOSIS — E1169 Type 2 diabetes mellitus with other specified complication: Secondary | ICD-10-CM | POA: Insufficient documentation

## 2023-10-13 MED ORDER — ATENOLOL 25 MG PO TABS
25.0000 mg | ORAL_TABLET | Freq: Every day | ORAL | 6 refills | Status: DC
  Filled 2023-10-13: qty 30, 30d supply, fill #0
  Filled 2023-11-21: qty 30, 30d supply, fill #1
  Filled 2023-12-23: qty 30, 30d supply, fill #2
  Filled 2024-01-24 (×2): qty 30, 30d supply, fill #3
  Filled 2024-02-27: qty 30, 30d supply, fill #4
  Filled 2024-03-30: qty 30, 30d supply, fill #5
  Filled 2024-04-30 (×2): qty 30, 30d supply, fill #6

## 2023-10-13 MED ORDER — CLOPIDOGREL BISULFATE 75 MG PO TABS
75.0000 mg | ORAL_TABLET | Freq: Every day | ORAL | 6 refills | Status: DC
  Filled 2023-10-13 – 2023-11-21 (×2): qty 30, 30d supply, fill #0
  Filled 2023-12-23: qty 30, 30d supply, fill #1
  Filled 2024-01-24 (×2): qty 30, 30d supply, fill #2
  Filled 2024-02-27: qty 30, 30d supply, fill #3
  Filled 2024-03-30: qty 30, 30d supply, fill #4
  Filled 2024-05-14: qty 30, 30d supply, fill #5
  Filled 2024-06-26: qty 30, 30d supply, fill #6

## 2023-10-13 MED ORDER — LISINOPRIL 10 MG PO TABS
10.0000 mg | ORAL_TABLET | Freq: Every day | ORAL | 6 refills | Status: DC
  Filled 2023-10-13: qty 30, 30d supply, fill #0
  Filled 2023-11-21: qty 30, 30d supply, fill #1
  Filled 2023-12-23: qty 30, 30d supply, fill #2
  Filled 2024-01-24: qty 30, 30d supply, fill #3
  Filled 2024-02-27: qty 30, 30d supply, fill #4
  Filled 2024-03-30: qty 30, 30d supply, fill #5
  Filled 2024-05-09 (×2): qty 30, 30d supply, fill #6

## 2023-10-13 MED ORDER — ATORVASTATIN CALCIUM 40 MG PO TABS
40.0000 mg | ORAL_TABLET | Freq: Every day | ORAL | 6 refills | Status: DC
  Filled 2023-10-13 – 2023-11-21 (×2): qty 30, 30d supply, fill #0
  Filled 2023-12-23: qty 30, 30d supply, fill #1
  Filled 2024-01-24 (×2): qty 30, 30d supply, fill #2
  Filled 2024-02-27: qty 30, 30d supply, fill #3
  Filled 2024-03-30: qty 30, 30d supply, fill #4
  Filled 2024-04-30 (×2): qty 30, 30d supply, fill #5
  Filled 2024-06-06 (×2): qty 30, 30d supply, fill #6

## 2023-10-13 MED ORDER — FUROSEMIDE 20 MG PO TABS
20.0000 mg | ORAL_TABLET | ORAL | 5 refills | Status: DC | PRN
  Filled 2023-10-13: qty 30, 30d supply, fill #0
  Filled 2024-02-06: qty 30, 30d supply, fill #1
  Filled 2024-05-14: qty 30, 30d supply, fill #2

## 2023-10-13 NOTE — Patient Instructions (Signed)
Medication Instructions:  No changes *If you need a refill on your cardiac medications before your next appointment, please call your pharmacy*   Lab Work: No labs If you have labs (blood work) drawn today and your tests are completely normal, you will receive your results only by: MyChart Message (if you have MyChart) OR A paper copy in the mail If you have any lab test that is abnormal or we need to change your treatment, we will call you to review the results.   Testing/Procedures: [address], [address]. Your physician has requested that you have an echocardiogram. Echocardiography is a painless test that uses sound waves to create images of your heart. It provides your doctor with information about the size and shape of your heart and how well your heart's chambers and valves are working. This procedure takes approximately one hour. There are no restrictions for this procedure. Please do NOT wear cologne, perfume, aftershave, or lotions (deodorant is allowed). Please arrive 15 minutes prior to your appointment time.  Please note: We ask at that you not bring children with you during ultrasound (echo/ vascular) testing. Due to room size and safety concerns, children are not allowed in the ultrasound rooms during exams. Our front office staff cannot provide observation of children in our lobby area while testing is being conducted. An adult accompanying a patient to their appointment will only be allowed in the ultrasound room at the discretion of the ultrasound technician under special circumstances. We apologize for any inconvenience.    Follow-Up: At [facility], you and your health needs are our priority.  As part of our continuing mission to provide you with exceptional heart care, we have created designated Provider Care Teams.  These Care Teams include your primary Cardiologist (physician) and Advanced Practice Providers (APPs -  Physician Assistants and Nurse  Practitioners) who all work together to provide you with the care you need, when you need it.  We recommend signing up for the patient portal called "MyChart".  Sign up information is provided on this After Visit Summary.  MyChart is used to connect with patients for Virtual Visits (Telemedicine).  Patients are able to view lab/test results, encounter notes, upcoming appointments, etc.  Non-urgent messages can be sent to your provider as well.   To learn more about what you can do with MyChart, go to [email or web address].    Your next appointment:   3 month(s)  Provider:   Elder Negus, MD

## 2023-10-19 ENCOUNTER — Other Ambulatory Visit: Payer: Self-pay

## 2023-10-19 ENCOUNTER — Ambulatory Visit: Payer: Medicare Other | Admitting: Neurology

## 2023-11-21 ENCOUNTER — Other Ambulatory Visit: Payer: Self-pay

## 2023-11-22 ENCOUNTER — Other Ambulatory Visit: Payer: Self-pay

## 2023-11-24 ENCOUNTER — Ambulatory Visit (HOSPITAL_COMMUNITY): Payer: Medicare Other | Attending: Adult Health

## 2023-11-24 DIAGNOSIS — E1159 Type 2 diabetes mellitus with other circulatory complications: Secondary | ICD-10-CM | POA: Insufficient documentation

## 2023-11-24 DIAGNOSIS — I152 Hypertension secondary to endocrine disorders: Secondary | ICD-10-CM | POA: Diagnosis present

## 2023-11-24 DIAGNOSIS — I2510 Atherosclerotic heart disease of native coronary artery without angina pectoris: Secondary | ICD-10-CM | POA: Insufficient documentation

## 2023-11-24 DIAGNOSIS — Z8673 Personal history of transient ischemic attack (TIA), and cerebral infarction without residual deficits: Secondary | ICD-10-CM | POA: Diagnosis not present

## 2023-11-24 DIAGNOSIS — E1169 Type 2 diabetes mellitus with other specified complication: Secondary | ICD-10-CM | POA: Diagnosis not present

## 2023-11-24 LAB — ECHOCARDIOGRAM COMPLETE
Area-P 1/2: 2.72 cm2
MV M vel: 5.68 m/s
MV Peak grad: 128.8 mm[Hg]
Radius: 0.45 cm
S' Lateral: 2.94 cm

## 2023-11-25 ENCOUNTER — Telehealth: Payer: Self-pay

## 2023-11-25 DIAGNOSIS — I059 Rheumatic mitral valve disease, unspecified: Secondary | ICD-10-CM

## 2023-11-25 NOTE — Telephone Encounter (Addendum)
-  Called patient regarding results. Spoke with patients daughter regarding results. Advised referral for heart Valve clinic . Per provider patient can take Furosemide  20 mg every other day. Patients daughter had understanding of results.---- Message from Lamarr Satterfield sent at 11/25/2023  7:39 AM EST ----- I have reviewed the echo report.  Normal heart pumping function. Stiffening of the heart muscle on relaxation, the mitral valve (the valve that separates the top left side of the the heart from the bottom left side of the heart) has a lot of calcium  on it make it hard to close and allowing some back flow of blood which may be causing some shortness of breath.  Will need referral to Heart Valve team. Will need to keep BP under strict control and lower salt in diet. KL

## 2023-11-29 ENCOUNTER — Telehealth: Payer: Self-pay | Admitting: Cardiology

## 2023-11-29 NOTE — Telephone Encounter (Signed)
 Attempted to reach the patient for consultation with no answer. LVM to return call.

## 2023-11-30 ENCOUNTER — Ambulatory Visit: Payer: Self-pay | Admitting: Cardiology

## 2023-12-16 ENCOUNTER — Other Ambulatory Visit: Payer: Self-pay | Admitting: Neurology

## 2023-12-16 ENCOUNTER — Other Ambulatory Visit: Payer: Self-pay

## 2023-12-16 MED ORDER — LACOSAMIDE 100 MG PO TABS
ORAL_TABLET | ORAL | 0 refills | Status: DC
  Filled 2023-12-16: qty 75, 30d supply, fill #0

## 2023-12-16 NOTE — Telephone Encounter (Signed)
Hasn't been seen in just over 1 yr. Needs appt. 1 month refill sent to Dr Teresa Coombs.

## 2023-12-23 ENCOUNTER — Other Ambulatory Visit: Payer: Self-pay

## 2023-12-31 NOTE — Progress Notes (Signed)
 Patient ID: [name] MRN: [id number] DOB/AGE: 77-Sep-1947 77 y.o.  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 female with the indicated medical history here for recommendations regarding her mitral valvular disease.  The patient had an echocardiogram recently which demonstrated mild to moderate mitral regurgitation with relatively calcified leaflets.  I did review the patient's echocardiogram and the mitral morphology looks to be postinflammatory or rheumatic in nature.  She is referred for further recommendations.  Past Medical History:  Diagnosis Date   AKI (acute kidney injury) 11/05/2021   AMS (altered mental status) 03/12/2021   Facial weakness 07/08/2014   Hyperlipidemia    Hypertension    Left leg weakness 07/08/2014   Osteoarthritis of hip 06/18/2021   Seizure 11/05/2021   STEMI (ST elevation myocardial infarction) 02/19/2022   Stroke    large left frontal lobe infarct with associated encephalomalacia. Additional redemonstrated infarcts in the right occipital lobe, left parietal lobe, posterior left temporal lobe, right cerebellum, and left greater than right basal ganglia   Subdural hemorrhage 07/08/2014   Type II diabetes mellitus 07/09/2014   Vascular dementia 07/13/2022    Past Surgical History:  Procedure Laterality Date   CORONARY STENT INTERVENTION N/A 02/19/2022   Procedure: CORONARY STENT INTERVENTION;  Surgeon: Cody Das, MD;  Location: [facility];  Service: Cardiovascular;  Laterality: N/A;   CORONARY/GRAFT ACUTE MI REVASCULARIZATION N/A 02/19/2022   Procedure: Coronary/Graft Acute MI Revascularization;  Surgeon: Cody Das, MD;  Location: [facility];  Service: Cardiovascular;  Laterality: N/A;   LEFT HEART CATH AND CORONARY ANGIOGRAPHY N/A 02/19/2022   Procedure: LEFT HEART CATH AND CORONARY ANGIOGRAPHY;  Surgeon: Cody Das, MD;  Location: [facility];  Service: Cardiovascular;  Laterality: N/A;   NO PAST SURGERIES      Family History  Problem Relation Age of Onset   Hyperlipidemia Mother    Hypertension Mother    Diabetes Mother    Stroke Father     Social History   Socioeconomic History   Marital status: Widowed    Spouse name: Not on file   Number of children: 3   Years of education: 12   Highest education level: High school graduate  Occupational History   Occupation: Retired  Tobacco Use   Smoking status: Former    Current packs/day: 0.00    Average packs/day: 1 pack/day for 48.0 years (48.0 ttl pk-yrs)    Types: Cigarettes    Start date: 83    Quit date: 2023    Years since quitting: 2.1   Smokeless tobacco: Never  Vaping Use   Vaping status: Never Used  Substance and Sexual Activity   Alcohol use: No   Drug use: No   Sexual activity: Not on file  Other Topics Concern   Not on file  Social History Narrative   Lives with daughter, Cornelius Dill, and her grandchildren.   Right-handed.   Three cups caffeine daily.   Social Drivers of Health   Financial Resource Strain: Low Risk  (07/05/2023)  Overall Financial Resource Strain (CARDIA)    Difficulty of Paying Living Expenses: Not hard at all  Food Insecurity: No Food Insecurity (07/05/2023)   Hunger Vital Sign    Worried About Running Out of Food in the Last Year: Never true    Ran Out of Food in the Last Year: Never true  Transportation Needs: No Transportation Needs (07/05/2023)   PRAPARE - Doctor, general practice (Medical): No    Lack of Transportation (Non-Medical): No  Physical Activity: Inactive (07/05/2023)   Exercise Vital Sign    Days of Exercise per Week: 0 days    Minutes of Exercise per Session: 0 min  Stress: No Stress Concern Present (07/05/2023)   [employer] of Occupational Health - Occupational Stress Questionnaire    Feeling of Stress : Not at all  Social Connections: Moderately Isolated (07/05/2023)   Social Connection and Isolation Panel [NHANES]    Frequency of Communication with Friends and Family: More than three times a week    Frequency of Social Gatherings with Friends and Family: Three times a week    Attends Religious Services: 1 to 4 times per year    Active Member of Clubs or Organizations: No    Attends Banker Meetings: Never    Marital Status: Widowed  Intimate Partner Violence: Not At Risk (07/05/2023)   Humiliation, Afraid, Rape, and Kick questionnaire    Fear of Current or Ex-Partner: No    Emotionally Abused: No    Physically Abused: No    Sexually Abused: No     Prior to Admission medications   Medication Sig Start Date End Date Taking? Authorizing Provider  acetaminophen  (TYLENOL ) 325 MG tablet Take 650 mg by mouth every 6 (six) hours as needed for pain or moderate pain.    [provider]  atenolol  (TENORMIN ) 25 MG tablet Take 1 tablet (25 mg total) by mouth daily. 10/13/23   Tania Familia, NP  atorvastatin  (LIPITOR) 40 MG tablet Take 1 tablet (40 mg total) by mouth daily. 10/13/23   Tania Familia, NP  Blood Glucose Monitoring Suppl (ACCU-CHEK AVIVA) device Use as instructed daily. 01/09/20   Newlin, Enobong, MD  clopidogrel  (PLAVIX ) 75 MG tablet Take 1 tablet (75 mg total) by mouth daily. 10/13/23   Tania Familia, NP  diclofenac  Sodium (VOLTAREN ) 1 % GEL Apply 4 g topically 4 (four) times daily as needed (arthritis pain). 02/10/23   Newlin, Enobong, MD  folic acid  (FOLVITE ) 1 MG tablet Take 1  tablet (1 mg total) by mouth daily. 11/06/21   Audria Leather, MD  furosemide  (LASIX ) 20 MG tablet Take 1 tablet (20 mg total) by mouth as needed. Patient taking differently: Take 20 mg by mouth every other day. Take 1 Tablet Every Other Day. 10/13/23   Tania Familia, NP  glucose blood (TRUE METRIX BLOOD GLUCOSE TEST) test strip Use as instructed 01/09/20   Newlin, Enobong, MD  Lacosamide  100 MG TABS Take 1 tablet (100 mg total) by mouth in the morning AND 1.5 tablets (150 mg total) at bedtime. MUST BE SEEN FOR FURTHER REFILLS.. 12/16/23   Cassandra Cleveland, MD  Lancets (ACCU-CHEK MULTICLIX) lancets Use as instructed 01/09/20   Newlin, Enobong, MD  lisinopril  (ZESTRIL ) 10 MG tablet Take 1 tablet (10 mg total) by mouth daily. 10/13/23 10/12/24  Tania Familia, NP  metFORMIN  (GLUCOPHAGE ) 500 MG tablet Take 2 tablets (1,000 mg total) by mouth 2 (two) times daily with a meal. (  Patient will need to make an office visit for next refill) 08/11/23   Newlin, Enobong, MD  nitroGLYCERIN  (NITROSTAT ) 0.4 MG SL tablet Place 1 tablet (0.4 mg total) under the tongue every 5 (five) minutes as needed for chest pain. 03/09/22 11/29/22  Cantwell, Celeste C, PA-C  vitamin B-12 (CYANOCOBALAMIN ) 1000 MCG tablet Take 1 tablet (1,000 mcg total) by mouth daily. 11/06/21   Audria Leather, MD    Allergies  Allergen Reactions   Penicillins Itching   Sulfa Antibiotics Other (See Comments)    Reaction not recalled, but patient was told she was allergic    REVIEW OF SYSTEMS:  General: no fevers/chills/night sweats Eyes: no blurry vision, diplopia, or amaurosis ENT: no sore throat or hearing loss Resp: no cough, wheezing, or hemoptysis CV: no edema or palpitations GI: no abdominal pain, nausea, vomiting, diarrhea, or constipation GU: no dysuria, frequency, or hematuria Skin: no rash Neuro: no headache, numbness, tingling, or weakness of extremities Musculoskeletal: no joint pain or swelling Heme: no bleeding, DVT, or  easy bruising Endo: no polydipsia or polyuria  LMP 09/28/2014 (Approximate)   PHYSICAL EXAM: GEN:  AO x 3 in no acute distress HEENT: normal Dentition: Normal*** Neck: JVP normal. +2***carotid upstrokes without bruits. No thyromegaly. Lungs: equal expansion, clear bilaterally CV: Apex is discrete and nondisplaced, RRR without murmur or gallop*** Abd: soft, non-tender, non-distended; no bruit; positive bowel sounds Ext: no edema, ecchymoses, or cyanosis Vascular: 2+ femoral pulses, 2+ radial pulses       Skin: warm and dry without rash Neuro: CN II-XII grossly intact; motor and sensory grossly intact    DATA AND STUDIES:  EKG: EKG from November 2024 demonstrates sinus rhythm with PACs  EKG Interpretation Date/Time:    Ventricular Rate:    PR Interval:    QRS Duration:    QT Interval:    QTC Calculation:   R Axis:      Text Interpretation:          Cardiac Studies & Procedures   CARDIAC CATHETERIZATION  CARDIAC CATHETERIZATION 02/19/2022  Narrative Images from the original result were not included. LM: Normal LAD: Minimal luminal irregularities Lcx: Prox 50% disease (non-culprit) Prox-mid 95% stenosis (culprit) RCA: Prox 20% disease  LVEDP 27 mmHg  Successful percutaneous coronary intervention mid LCx PTCA and stent placement 3.0 X 20 mm Synergy drug-eluting stent Post dilatation with 3.25X15 mm Reform balloon at 16 atm 95%-->0% stenosis at culprit lesion TIMI III flow   Cody Das, MD Pager: [phone] Office: [phone]  Findings Coronary Findings Diagnostic  Dominance: Right  Left Main Vessel is normal in caliber. Vessel is angiographically normal.  Left Anterior Descending Vessel is normal in caliber. The vessel exhibits minimal luminal irregularities.  Left Circumflex Prox Cx lesion is 50% stenosed. Prox Cx to Mid Cx lesion is 95% stenosed. The lesion is ulcerative.  Right Coronary Artery Prox RCA lesion is 20%  stenosed.  Intervention  Prox Cx to Mid Cx lesion Stent Lesion length:  15 mm. Lesion crossed with guidewire using a WIRE ASAHI PROWATER 180CM. Pre-stent angioplasty was performed using a BALLN SAPPHIRE 3.0X15. Maximum pressure:  6 atm. Inflation time:  10 sec. A drug-eluting stent was successfully placed using a SYNERGY XD 3.0X20. Maximum pressure: 14 atm. Inflation time: 40 sec. Stent strut is well apposed. Post-stent angioplasty was performed using a BALLN SAPPHIRE [place] 3.25X15. Maximum pressure:  16 atm. Inflation time:  20 sec. Patient anatomy related delay due to difficulty wiring an ulcerated 95% stenosis Post-Intervention  Lesion Assessment The intervention was successful. Pre-interventional TIMI flow is 2. Post-intervention TIMI flow is 3. No complications occurred at this lesion. There is a 0% residual stenosis post intervention.    ECHOCARDIOGRAM  ECHOCARDIOGRAM COMPLETE 11/24/2023  Narrative ECHOCARDIOGRAM REPORT    Patient Name:   [name] Date of Exam: 11/24/2023 Medical Rec #:  [id number]           Height:       63.0 in Accession #:    [id number]          Weight:       180.0 lb Date of Birth:  1946-02-12          BSA:          1.849 m Patient Age:    77 years            BP:           138/78 mmHg Patient Gender: F                   HR:           70 bpm. Exam Location:  [address]  Procedure: 2D Echo, Cardiac Doppler, Color Doppler, 3D Echo and Strain Analysis  Indications:    Shortness of breath [786.05.ICD-9-CM]  History:        Patient has prior history of Echocardiogram examinations, most recent 02/19/2022. CAD and Previous Myocardial Infarction, Stroke, Signs/Symptoms:Shortness of Breath; Risk Factors:Hypertension, Diabetes and Dyslipidemia.  Sonographer:    Ruta Cousins RDCS Referring Phys: [id number] KATHRYN M LAWRENCE   Sonographer Comments: Global longitudinal strain was attempted. IMPRESSIONS   1. Left ventricular ejection fraction, by estimation,  is 55%. The left ventricle has no regional wall motion abnormalities. Left ventricular diastolic parameters are consistent with Grade II diastolic dysfunction (pseudonormalization). GLS -16.4%. 2. Right ventricular systolic function is normal. The right ventricular size is normal. There is normal pulmonary artery systolic pressure. 3. The mitral valve leaflets are moderate to severely calcified. There is mild to moderate mitral regurgitation. Mitral valve mean gradient of . 4. The aortic valve is normal in structure. Aortic valve regurgitation is not visualized. No aortic stenosis is present. 5. The inferior vena cava is normal in size with greater than 50% respiratory variability, suggesting right atrial pressure of 3 mmHg.  FINDINGS Left Ventricle: Left ventricular ejection fraction, by estimation, is 50 to 55%. The left ventricle has low normal function. The left ventricle has no regional wall motion abnormalities. The left ventricular internal cavity size was normal in size. There is no left ventricular hypertrophy. Left ventricular diastolic parameters are consistent with Grade II diastolic dysfunction (pseudonormalization).  Right Ventricle: The right ventricular size is normal. No increase in right ventricular wall thickness. Right ventricular systolic function is normal. There is normal pulmonary artery systolic pressure. The tricuspid regurgitant velocity is 2.40 m/s, and with an assumed right atrial pressure of 3 mmHg, the estimated right ventricular systolic pressure is 26.0 mmHg.  Left Atrium: Left atrial size was normal in size.  Right Atrium: Right atrial size was normal in size.  Pericardium: There is no evidence of pericardial effusion.  Mitral Valve: The mitral valve is abnormal. There is mild thickening of the mitral valve leaflet(s). There is severe calcification of the mitral valve leaflet(s). Mild to moderate mitral valve regurgitation. No evidence of mitral valve  stenosis. MV peak gradient, 8.6 mmHg. The mean mitral valve gradient is 4.0 mmHg.  Tricuspid Valve: The tricuspid valve is normal  in structure. Tricuspid valve regurgitation is mild . No evidence of tricuspid stenosis.  Aortic Valve: The aortic valve is normal in structure. Aortic valve regurgitation is not visualized. No aortic stenosis is present.  Pulmonic Valve: The pulmonic valve was normal in structure. Pulmonic valve regurgitation is trivial. No evidence of pulmonic stenosis.  Aorta: The aortic root is normal in size and structure.  Venous: The inferior vena cava is normal in size with greater than 50% respiratory variability, suggesting right atrial pressure of 3 mmHg.  IAS/Shunts: No atrial level shunt detected by color flow Doppler.   LEFT VENTRICLE PLAX 2D LVIDd:         3.97 cm   Diastology LVIDs:         2.94 cm   LV e' medial:    4.88 cm/s LV PW:         1.11 cm   LV E/e' medial:  31.1 LV IVS:        1.12 cm   LV e' lateral:   7.35 cm/s LVOT diam:     1.70 cm   LV E/e' lateral: 20.7 LV SV:         43 LV SV Index:   23 LVOT Area:     2.27 cm  3D Volume EF: 3D EF:        50 % LV EDV:       146 ml LV ESV:       73 ml LV SV:        73 ml  RIGHT VENTRICLE RV S prime:     12.50 cm/s TAPSE (M-mode): 2.1 cm  LEFT ATRIUM             Index        RIGHT ATRIUM           Index LA diam:        3.60 cm 1.95 cm/m   RA Area:     12.00 cm LA Vol (A2C):   46.3 ml 25.04 ml/m  RA Volume:   28.00 ml  15.14 ml/m LA Vol (A4C):   53.2 ml 28.77 ml/m LA Biplane Vol: 51.1 ml 27.64 ml/m AORTIC VALVE LVOT Vmax:   75.40 cm/s LVOT Vmean:  52.600 cm/s LVOT VTI:    0.188 m  AORTA Ao Root diam: 2.60 cm Ao Asc diam:  2.50 cm  MITRAL VALVE                 TRICUSPID VALVE MV Area (PHT): 2.72 cm      TR Peak grad:   23.0 mmHg MV Peak grad:  8.6 mmHg      TR Vmax:        240.00 cm/s MV Mean grad:  4.0 mmHg MV Vmax:       1.47 m/s      SHUNTS MV Vmean:      89.4 cm/s      Systemic VTI:  0.19 m MR Peak grad:   128.8 mmHg   Systemic Diam: 1.70 cm MR Mean grad:   97.5 mmHg MR Vmax:        567.50 cm/s MR Vmean:       477.5 cm/s MR PISA:        1.27 cm MR PISA Radius: 0.45 cm MV E velocity: 152.00 cm/s MV A velocity: 135.00 cm/s MV E/A ratio:  1.13  Aditya Sabharwal Electronically signed by Alwin Baars Signature Date/Time: 11/24/2023/7:03:09 PM    Final  02/10/2023: ALT 23 08/11/2023: BUN 12; Creatinine, Ser 1.02; NT-Pro BNP 739; Potassium 5.1; Sodium 141   STS RISK CALCULATOR: Pending  NHYA CLASS: ***     ASSESSMENT AND PLAN:   Mitral valve disease  Diastolic dysfunction  Coronary artery disease involving native coronary artery of native heart without angina pectoris  Type 2 diabetes mellitus with complication, without long-term current use of insulin  (HCC)  Hypertension associated with diabetes (HCC)  Hyperlipidemia associated with type 2 diabetes mellitus (HCC)  H/O: CVA (cerebrovascular accident)  Seizure disorder as sequela of cerebrovascular accident (HCC)  BMI 33.0-33.9,adult   Mitral regurgitation: Only mild to moderate.  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  This is not really a good anatomy for a mitral transcatheter edge-to-edge repair and the severity does not warrant such.  Continue medical therapy. Diastolic dysfunction: Lisinopril  10 mg and start Jardiance 10 mg daily.  Continue atenolol  25 mg daily.*** Coronary artery disease: Continue Plavix  75 mg daily, atorvastatin  40 mg daily, as needed nitroglycerin . Type 2 diabetes mellitus: Plavix  75 mg daily, lisinopril  10 mg daily, start Jardiance 10 mg daily, continue atorvastatin  40 mg daily.*** Hypertension:*** Hyperlipidemia: Given history of stroke and acute coronary syndrome goal LDL is less than 55.  This is being followed by the patient's primary cardiology team. History of stroke: Continue Plavix  statin 40 mg, and control blood pressure. Seizure  disorder: Continue lacosamide  100 mg. Elevated BMI: Consider referral to pharmacy for recommendations regarding GLP-1 receptor agonist therapy.  I have personally reviewed the patients imaging data as summarized above.  I have reviewed the natural history of mitral regurgitation with the patient and family members who are present today. We have discussed the limitations of medical therapy and the poor prognosis associated with symptomatic mitral regurgitation. We have also reviewed potential treatment options, including palliative medical therapy, conventional mitral surgery, and transcatheter mitral edge-to-edge repair. We discussed treatment options in the context of this patient's specific comorbid medical conditions.   All of the patient's questions were answered today. Will make further recommendations based on the results of studies outlined above.   I spent *** minutes reviewing all clinical data during and prior to this visit including all relevant imaging studies, laboratories, clinical information from other health systems and prior notes from both Cardiology and other specialties, interviewing the patient, conducting a complete physical examination, and coordinating care in order to formulate a comprehensive and personalized evaluation and treatment plan.   Jaleena Viviani K Zylon Creamer, MD  12/31/2023 7:32 AM    [facility] [address] [place], [place], [place]  [address] Phone: [phone]; Fax: [phone]

## 2024-01-02 ENCOUNTER — Ambulatory Visit: Payer: Medicare Other | Attending: Internal Medicine | Admitting: Internal Medicine

## 2024-01-02 ENCOUNTER — Telehealth: Payer: Self-pay

## 2024-01-02 ENCOUNTER — Ambulatory Visit: Payer: Medicare Other | Admitting: Cardiology

## 2024-01-02 DIAGNOSIS — I2510 Atherosclerotic heart disease of native coronary artery without angina pectoris: Secondary | ICD-10-CM

## 2024-01-02 DIAGNOSIS — I5189 Other ill-defined heart diseases: Secondary | ICD-10-CM

## 2024-01-02 DIAGNOSIS — I152 Hypertension secondary to endocrine disorders: Secondary | ICD-10-CM

## 2024-01-02 DIAGNOSIS — Z8673 Personal history of transient ischemic attack (TIA), and cerebral infarction without residual deficits: Secondary | ICD-10-CM

## 2024-01-02 DIAGNOSIS — G40909 Epilepsy, unspecified, not intractable, without status epilepticus: Secondary | ICD-10-CM

## 2024-01-02 DIAGNOSIS — E1169 Type 2 diabetes mellitus with other specified complication: Secondary | ICD-10-CM

## 2024-01-02 DIAGNOSIS — I059 Rheumatic mitral valve disease, unspecified: Secondary | ICD-10-CM

## 2024-01-02 DIAGNOSIS — E118 Type 2 diabetes mellitus with unspecified complications: Secondary | ICD-10-CM

## 2024-01-02 DIAGNOSIS — Z6833 Body mass index (BMI) 33.0-33.9, adult: Secondary | ICD-10-CM

## 2024-01-02 NOTE — Telephone Encounter (Signed)
 The patient's daughter reported she works night shift and tried to stay up but fell asleep and missed picking her mom up for the appointment.  Rescheduled the patient for 01/09/2024 at 0840 per request as she will go home from work, get the patient, and go straight to appointment. She was grateful for call and agreed with plan.

## 2024-01-04 NOTE — Progress Notes (Signed)
 Patient ID: MAGUIRE KILLMER MRN: [id number] DOB/AGE: 02-16-1946 78 y.o.  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  History of Present Illness         [name] is a 78 year old female with coronary artery disease and mitral valve issues who presents for evaluation of her heart valve condition. She is accompanied by her daughter, who is her primary caregiver. She was referred by Dr. Metta Clines for evaluation of her heart valve condition following an ultrasound test.  She has a history of coronary artery disease, including a heart attack in 2023 and subsequent stent placement. An ultrasound revealed calcification and potential leakage of her mitral valve. She experiences shortness of breath during activities such as picking up toys or bending over. Her daughter has observed these symptoms.  However she is able to do most of her activities of daily living by modulating her activity level.  She has high cholesterol, hypertension, and diabetes managed with metformin. She also has a history of stroke and intracranial hemorrhage in 2015, leading to vascular dementia. Her daughter notes that she becomes easily winded since her heart attack and has an unsteady gait, likely due to her strokes and dementia.  She experiences occasional chest pain, described as 'like this,' and has  used nitroglycerin once in the past two weeks, which provided relief. No recent episodes of syncope, peripheral edema, fevers, chills, or significant bleeding.  Her daily routine involves assistance from her daughter with activities such as making coffee, showering, and taking medications. She engages in light activities like folding clothes and playing with her great-grandson but avoids more strenuous activities due to her symptoms.  The patient had an echocardiogram recently which demonstrated mild to moderate mitral regurgitation with relatively calcified leaflets.  I did review the patient's echocardiogram and the mitral morphology looks to be postinflammatory or rheumatic in nature.  She is referred for further recommendations.       Past Medical History:  Diagnosis Date   AKI (acute kidney injury) 11/05/2021   AMS (altered mental status) 03/12/2021   Facial weakness 07/08/2014   Hyperlipidemia    Hypertension    Left leg weakness 07/08/2014   Osteoarthritis of hip 06/18/2021   Seizure 11/05/2021   STEMI (ST elevation myocardial infarction) 02/19/2022   Stroke    large left frontal lobe infarct with associated encephalomalacia. Additional redemonstrated infarcts in the right occipital lobe, left parietal lobe, posterior left temporal lobe, right cerebellum, and left greater than right basal ganglia   Subdural hemorrhage 07/08/2014   Type II diabetes mellitus 07/09/2014   Vascular dementia 07/13/2022    Past Surgical History:  Procedure Laterality Date   CORONARY STENT INTERVENTION N/A 02/19/2022   Procedure: CORONARY STENT INTERVENTION;  Surgeon: Elder Negus, MD;  Location: [facility];  Service: Cardiovascular;  Laterality: N/A;   CORONARY/GRAFT ACUTE MI REVASCULARIZATION N/A 02/19/2022   Procedure: Coronary/Graft Acute MI Revascularization;  Surgeon: Elder Negus, MD;  Location: [facility];  Service: Cardiovascular;  Laterality: N/A;   LEFT HEART  CATH AND CORONARY ANGIOGRAPHY N/A 02/19/2022   Procedure: LEFT HEART CATH AND CORONARY ANGIOGRAPHY;  Surgeon: Elder Negus, MD;  Location: [facility];  Service: Cardiovascular;  Laterality: N/A;   NO PAST SURGERIES      Family History  Problem Relation Age of Onset   Hyperlipidemia Mother    Hypertension Mother    Diabetes Mother    Stroke Father     Social History   Socioeconomic History   Marital status: Widowed    Spouse name: Not on file   Number of children: 3   Years of education: 12   Highest education level: High school graduate  Occupational History   Occupation: Retired  Tobacco Use   Smoking status: Former    Current packs/day: 0.00    Average packs/day: 1 pack/day for 48.0 years (48.0 ttl pk-yrs)    Types: Cigarettes    Start date: 62    Quit date: 2023    Years since quitting: 2.1   Smokeless tobacco: Never  Vaping Use   Vaping status: Never Used  Substance and Sexual Activity   Alcohol use: No   Drug use: No   Sexual activity: Not on file  Other Topics Concern   Not on file  Social History Narrative   Lives with daughter, [name], and her grandchildren.   Right-handed.   Three cups caffeine daily.   Social Drivers of Corporate investment banker Strain: Low Risk  (07/05/2023)   Overall Financial Resource Strain (CARDIA)    Difficulty of Paying Living Expenses: Not hard at all  Food Insecurity: No Food Insecurity (07/05/2023)   Hunger Vital Sign    Worried About Running Out of Food in the Last Year: Never true    Ran Out of Food in the Last Year: Never true  Transportation Needs: No Transportation Needs (07/05/2023)   PRAPARE - Administrator, Civil Service (Medical): No    Lack of Transportation (Non-Medical): No  Physical Activity: Inactive (07/05/2023)   Exercise Vital Sign    Days of Exercise per Week: 0 days    Minutes of Exercise per Session: 0 min  Stress: No Stress Concern Present (07/05/2023)   [employer]  of Occupational Health - Occupational Stress Questionnaire    Feeling of Stress : Not at all  Social Connections: Moderately Isolated (07/05/2023)   Social Connection and Isolation Panel [NHANES]    Frequency of Communication with Friends and Family: More than three times a week    Frequency of Social Gatherings with Friends and Family: Three times a week    Attends Religious Services: 1 to 4 times per year    Active Member of Clubs or Organizations: No    Attends Banker Meetings: Never    Marital Status: Widowed  Intimate Partner Violence: Not At Risk (07/05/2023)   Humiliation, Afraid, Rape, and Kick questionnaire    Fear of Current or Ex-Partner: No    Emotionally Abused: No    Physically Abused: No    Sexually Abused: No     Prior to Admission medications   Medication Sig Start Date End Date Taking? Authorizing Provider  acetaminophen (TYLENOL) 325 MG tablet Take 650 mg by mouth every 6 (six) hours as needed for pain or moderate pain.    [provider]  atenolol (TENORMIN) 25 MG tablet Take  1 tablet (25 mg total) by mouth daily. 10/13/23   Jodelle Gross, NP  atorvastatin (LIPITOR) 40 MG tablet Take 1 tablet (40 mg total) by mouth daily. 10/13/23   Jodelle Gross, NP  Blood Glucose Monitoring Suppl (ACCU-CHEK AVIVA) device Use as instructed daily. 01/09/20   Hoy Register, MD  clopidogrel (PLAVIX) 75 MG tablet Take 1 tablet (75 mg total) by mouth daily. 10/13/23   Jodelle Gross, NP  diclofenac Sodium (VOLTAREN) 1 % GEL Apply 4 g topically 4 (four) times daily as needed (arthritis pain). 02/10/23   Hoy Register, MD  folic acid (FOLVITE) 1 MG tablet Take 1 tablet (1 mg total) by mouth daily. 11/06/21   Glade Lloyd, MD  furosemide (LASIX) 20 MG tablet Take 1 tablet (20 mg total) by mouth as needed. Patient taking differently: Take 20 mg by mouth every other day. Take 1 Tablet Every Other Day. 10/13/23   Jodelle Gross, NP  glucose blood  (TRUE METRIX BLOOD GLUCOSE TEST) test strip Use as instructed 01/09/20   Hoy Register, MD  Lacosamide 100 MG TABS Take 1 tablet (100 mg total) by mouth in the morning AND 1.5 tablets (150 mg total) at bedtime. MUST BE SEEN FOR FURTHER REFILLS.. 12/16/23   Windell Norfolk, MD  Lancets (ACCU-CHEK MULTICLIX) lancets Use as instructed 01/09/20   Hoy Register, MD  lisinopril (ZESTRIL) 10 MG tablet Take 1 tablet (10 mg total) by mouth daily. 10/13/23 10/12/24  Jodelle Gross, NP  metFORMIN (GLUCOPHAGE) 500 MG tablet Take 2 tablets (1,000 mg total) by mouth 2 (two) times daily with a meal. (Patient will need to make an office visit for next refill) 08/11/23   Hoy Register, MD  nitroGLYCERIN (NITROSTAT) 0.4 MG SL tablet Place 1 tablet (0.4 mg total) under the tongue every 5 (five) minutes as needed for chest pain. 03/09/22 11/29/22  Cantwell, Celeste C, PA-C  vitamin B-12 (CYANOCOBALAMIN) 1000 MCG tablet Take 1 tablet (1,000 mcg total) by mouth daily. 11/06/21   Glade Lloyd, MD    Allergies  Allergen Reactions   Penicillins Itching   Sulfa Antibiotics Other (See Comments)    Reaction not recalled, but patient was told she was allergic    REVIEW OF SYSTEMS:  General: no fevers/chills/night sweats Eyes: no blurry vision, diplopia, or amaurosis ENT: no sore throat or hearing loss Resp: no cough, wheezing, or hemoptysis CV: no edema or palpitations GI: no abdominal pain, nausea, vomiting, diarrhea, or constipation GU: no dysuria, frequency, or hematuria Skin: no rash Neuro: no headache, numbness, tingling, or weakness of extremities Musculoskeletal: no joint pain or swelling Heme: no bleeding, DVT, or easy bruising Endo: no polydipsia or polyuria  BP 122/80   Pulse 62   Ht 5\' 3"  (1.6 m)   Wt 178 lb 9.6 oz (81 kg)   LMP 09/28/2014 (Approximate)   SpO2 99%   BMI 31.64 kg/m   PHYSICAL EXAM: GEN:  AO x 3 in no acute distress HEENT: normal Dentition: Normal Neck: JVP normal. +2  carotid upstrokes without bruits. No thyromegaly. Lungs: equal expansion, clear bilaterally CV: Apex is discrete and nondisplaced, RRR without murmur or gallop Abd: soft, non-tender, non-distended; no bruit; positive bowel sounds Ext: no edema, ecchymoses, or cyanosis Vascular: 2+ femoral pulses, 2+ radial pulses       Skin: warm and dry without rash Neuro: CN II-XII grossly intact; motor and sensory grossly intact    DATA AND STUDIES:  EKG: EKG from November 2024 demonstrates sinus rhythm  with PACs   EKG Interpretation Date/Time:  Monday January 09 2024 08:40:35 EST Ventricular Rate:  62 PR Interval:  170 QRS Duration:  82 QT Interval:  426 QTC Calculation: 432 R Axis:   72  Text Interpretation: Normal sinus rhythm Normal ECG When compared with ECG of 13-Oct-2023 10:22, Premature supraventricular complexes are no longer Present Confirmed by Alverda Skeans ([id number]) on 01/09/2024 8:50:18 AM        Cardiac Studies & Procedures   ______________________________________________________________________________________________ CARDIAC CATHETERIZATION  CARDIAC CATHETERIZATION 02/19/2022  Narrative Images from the original result were not included. LM: Normal LAD: Minimal luminal irregularities Lcx: Prox 50% disease (non-culprit) Prox-mid 95% stenosis (culprit) RCA: Prox 20% disease  LVEDP 27 mmHg  Successful percutaneous coronary intervention mid LCx PTCA and stent placement 3.0 X 20 mm Synergy drug-eluting stent Post dilatation with 3.25X15 mm [place] balloon at 16 atm 95%-->0% stenosis at culprit lesion TIMI III flow   Elder Negus, MD Pager: [phone] Office: [phone]  Findings Coronary Findings Diagnostic  Dominance: Right  Left Main Vessel is normal in caliber. Vessel is angiographically normal.  Left Anterior Descending Vessel is normal in caliber. The vessel exhibits minimal luminal irregularities.  Left Circumflex Prox Cx lesion is 50%  stenosed. Prox Cx to Mid Cx lesion is 95% stenosed. The lesion is ulcerative.  Right Coronary Artery Prox RCA lesion is 20% stenosed.  Intervention  Prox Cx to Mid Cx lesion Stent Lesion length:  15 mm. Lesion crossed with guidewire using a WIRE ASAHI PROWATER 180CM. Pre-stent angioplasty was performed using a BALLN SAPPHIRE 3.0X15. Maximum pressure:  6 atm. Inflation time:  10 sec. A drug-eluting stent was successfully placed using a SYNERGY XD 3.0X20. Maximum pressure: 14 atm. Inflation time: 40 sec. Stent strut is well apposed. Post-stent angioplasty was performed using a BALLN SAPPHIRE [place] 3.25X15. Maximum pressure:  16 atm. Inflation time:  20 sec. Patient anatomy related delay due to difficulty wiring an ulcerated 95% stenosis Post-Intervention Lesion Assessment The intervention was successful. Pre-interventional TIMI flow is 2. Post-intervention TIMI flow is 3. No complications occurred at this lesion. There is a 0% residual stenosis post intervention.     ECHOCARDIOGRAM  ECHOCARDIOGRAM COMPLETE 11/24/2023  Narrative ECHOCARDIOGRAM REPORT    Patient Name:   AHLEAH SIMKO Date of Exam: 11/24/2023 Medical Rec #:  [id number]           Height:       63.0 in Accession #:    [id number]          Weight:       180.0 lb Date of Birth:  1946-08-20          BSA:          1.849 m Patient Age:    77 years            BP:           138/78 mmHg Patient Gender: F                   HR:           70 bpm. Exam Location:  [address]  Procedure: 2D Echo, Cardiac Doppler, Color Doppler, 3D Echo and Strain Analysis  Indications:    Shortness of breath [786.05.ICD-9-CM]  History:        Patient has prior history of Echocardiogram examinations, most recent 02/19/2022. CAD and Previous Myocardial Infarction, Stroke, Signs/Symptoms:Shortness of Breath; Risk Factors:Hypertension, Diabetes and Dyslipidemia.  Sonographer:    Eulah Pont  RDCS Referring Phys: [id number] Bettey Mare  [place]   Sonographer Comments: Global longitudinal strain was attempted. IMPRESSIONS   1. Left ventricular ejection fraction, by estimation, is 55%. The left ventricle has no regional wall motion abnormalities. Left ventricular diastolic parameters are consistent with Grade II diastolic dysfunction (pseudonormalization). GLS -16.4%. 2. Right ventricular systolic function is normal. The right ventricular size is normal. There is normal pulmonary artery systolic pressure. 3. The mitral valve leaflets are moderate to severely calcified. There is mild to moderate mitral regurgitation. Mitral valve mean gradient of . 4. The aortic valve is normal in structure. Aortic valve regurgitation is not visualized. No aortic stenosis is present. 5. The inferior vena cava is normal in size with greater than 50% respiratory variability, suggesting right atrial pressure of 3 mmHg.  FINDINGS Left Ventricle: Left ventricular ejection fraction, by estimation, is 50 to 55%. The left ventricle has low normal function. The left ventricle has no regional wall motion abnormalities. The left ventricular internal cavity size was normal in size. There is no left ventricular hypertrophy. Left ventricular diastolic parameters are consistent with Grade II diastolic dysfunction (pseudonormalization).  Right Ventricle: The right ventricular size is normal. No increase in right ventricular wall thickness. Right ventricular systolic function is normal. There is normal pulmonary artery systolic pressure. The tricuspid regurgitant velocity is 2.40 m/s, and with an assumed right atrial pressure of 3 mmHg, the estimated right ventricular systolic pressure is 26.0 mmHg.  Left Atrium: Left atrial size was normal in size.  Right Atrium: Right atrial size was normal in size.  Pericardium: There is no evidence of pericardial effusion.  Mitral Valve: The mitral valve is abnormal. There is mild thickening of the mitral valve  leaflet(s). There is severe calcification of the mitral valve leaflet(s). Mild to moderate mitral valve regurgitation. No evidence of mitral valve stenosis. MV peak gradient, 8.6 mmHg. The mean mitral valve gradient is 4.0 mmHg.  Tricuspid Valve: The tricuspid valve is normal in structure. Tricuspid valve regurgitation is mild . No evidence of tricuspid stenosis.  Aortic Valve: The aortic valve is normal in structure. Aortic valve regurgitation is not visualized. No aortic stenosis is present.  Pulmonic Valve: The pulmonic valve was normal in structure. Pulmonic valve regurgitation is trivial. No evidence of pulmonic stenosis.  Aorta: The aortic root is normal in size and structure.  Venous: The inferior vena cava is normal in size with greater than 50% respiratory variability, suggesting right atrial pressure of 3 mmHg.  IAS/Shunts: No atrial level shunt detected by color flow Doppler.   LEFT VENTRICLE PLAX 2D LVIDd:         3.97 cm   Diastology LVIDs:         2.94 cm   LV e' medial:    4.88 cm/s LV PW:         1.11 cm   LV E/e' medial:  31.1 LV IVS:        1.12 cm   LV e' lateral:   7.35 cm/s LVOT diam:     1.70 cm   LV E/e' lateral: 20.7 LV SV:         43 LV SV Index:   23 LVOT Area:     2.27 cm  3D Volume EF: 3D EF:        50 % LV EDV:       146 ml LV ESV:       73 ml LV SV:  73 ml  RIGHT VENTRICLE RV S prime:     12.50 cm/s TAPSE (M-mode): 2.1 cm  LEFT ATRIUM             Index        RIGHT ATRIUM           Index LA diam:        3.60 cm 1.95 cm/m   RA Area:     12.00 cm LA Vol (A2C):   46.3 ml 25.04 ml/m  RA Volume:   28.00 ml  15.14 ml/m LA Vol (A4C):   53.2 ml 28.77 ml/m LA Biplane Vol: 51.1 ml 27.64 ml/m AORTIC VALVE LVOT Vmax:   75.40 cm/s LVOT Vmean:  52.600 cm/s LVOT VTI:    0.188 m  AORTA Ao Root diam: 2.60 cm Ao Asc diam:  2.50 cm  MITRAL VALVE                 TRICUSPID VALVE MV Area (PHT): 2.72 cm      TR Peak grad:   23.0 mmHg MV Peak  grad:  8.6 mmHg      TR Vmax:        240.00 cm/s MV Mean grad:  4.0 mmHg MV Vmax:       1.47 m/s      SHUNTS MV Vmean:      89.4 cm/s     Systemic VTI:  0.19 m MR Peak grad:   128.8 mmHg   Systemic Diam: 1.70 cm MR Mean grad:   97.5 mmHg MR Vmax:        567.50 cm/s MR Vmean:       477.5 cm/s MR PISA:        1.27 cm MR PISA Radius: 0.45 cm MV E velocity: 152.00 cm/s MV A velocity: 135.00 cm/s MV E/A ratio:  1.13  Aditya Sabharwal Electronically signed by Dorthula Nettles Signature Date/Time: 11/24/2023/7:03:09 PM    Final          ______________________________________________________________________________________________      02/10/2023: ALT 23 08/11/2023: BUN 12; Creatinine, Ser 1.02; NT-Pro BNP 739; Potassium 5.1; Sodium 141   STS RISK CALCULATOR: Pending  NHYA CLASS: 1/2     ASSESSMENT AND PLAN:   Mitral valve disease - Plan: EKG 12-Lead  Diastolic dysfunction - Plan: EKG 12-Lead  Coronary artery disease involving native coronary artery of native heart without angina pectoris - Plan: EKG 12-Lead  Type 2 diabetes mellitus with complication, without long-term current use of insulin (HCC) - Plan: EKG 12-Lead  Hypertension associated with diabetes (HCC) - Plan: EKG 12-Lead  Hyperlipidemia associated with type 2 diabetes mellitus (HCC) - Plan: EKG 12-Lead  H/O: CVA (cerebrovascular accident) - Plan: EKG 12-Lead  Seizure disorder as sequela of cerebrovascular accident (HCC) - Plan: EKG 12-Lead  BMI 33.0-33.9,adult - Plan: EKG 12-Lead  Mitral regurgitation: Only mild to moderate.  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  This is not really a good anatomy for a mitral transcatheter edge-to-edge repair and the severity does not warrant such.  I had a long conversation with the patient and her daughter today.  I told them that her valve disease is not severe enough to warrant any interventions and she is only mildly symptomatic.  At this point in time I will  continue to manage her medically and monitor her valve.  Given the calcified leaflet tips this is not anatomy well-suited for transcatheter mitral edge-to-edge repair.  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  This would normally be treated with surgery however given her other comorbidities she is not a surgical candidate.  Her daughter agrees with conservative measures.  Continue medical therapy. Diastolic dysfunction: Lisinopril 10 mg and start Jardiance 10 mg daily.  Continue atenolol 25 mg daily. Coronary artery disease: Continue Plavix 75 mg daily, atorvastatin 40 mg daily, as needed nitroglycerin.  Has only needed nitroglycerin very infrequently. Type 2 diabetes mellitus: Plavix 75 mg daily, lisinopril 10 mg daily, continue atorvastatin; consider Jardiance. Hypertension: Blood pressure is well-controlled today. Hyperlipidemia: Given history of stroke and acute coronary syndrome goal LDL is less than 55.  This is being followed by the patient's primary cardiology team. History of stroke: Continue Plavix statin 40 mg, and control blood pressure. Seizure disorder: Continue lacosamide 100 mg. Elevated BMI: Could consider referral to pharmacy for GLP-1 receptor agonist therapy; otherwise diet and exercise modification.  I have personally reviewed the patients imaging data as summarized above.  I have reviewed the natural history of mitral regurgitation with the patient and family members who are present today. We have discussed the limitations of medical therapy and the poor prognosis associated with symptomatic mitral regurgitation. We have also reviewed potential treatment options, including palliative medical therapy, conventional mitral surgery, and transcatheter mitral edge-to-edge repair. We discussed treatment options in the context of this patient's specific comorbid medical conditions.   All of the patient's questions were answered today. Will make further  recommendations based on the results of studies outlined above.   I spent 44 minutes reviewing all clinical data during and prior to this visit including all relevant imaging studies, laboratories, clinical information from other health systems and prior notes from both Cardiology and other specialties, interviewing the patient, conducting a complete physical examination, and coordinating care in order to formulate a comprehensive and personalized evaluation and treatment plan.    Orbie Pyo, MD  01/09/2024 9:18 AM    [facility] [address] [place], [place], [place]  [address] Phone: [phone]; Fax: [phone]

## 2024-01-09 ENCOUNTER — Ambulatory Visit: Payer: Medicare Other | Attending: Internal Medicine | Admitting: Internal Medicine

## 2024-01-09 ENCOUNTER — Encounter: Payer: Self-pay | Admitting: Internal Medicine

## 2024-01-09 VITALS — BP 122/80 | HR 62 | Ht 63.0 in | Wt 178.6 lb

## 2024-01-09 DIAGNOSIS — I2510 Atherosclerotic heart disease of native coronary artery without angina pectoris: Secondary | ICD-10-CM | POA: Diagnosis present

## 2024-01-09 DIAGNOSIS — G40909 Epilepsy, unspecified, not intractable, without status epilepticus: Secondary | ICD-10-CM

## 2024-01-09 DIAGNOSIS — I059 Rheumatic mitral valve disease, unspecified: Secondary | ICD-10-CM | POA: Diagnosis not present

## 2024-01-09 DIAGNOSIS — E1159 Type 2 diabetes mellitus with other circulatory complications: Secondary | ICD-10-CM

## 2024-01-09 DIAGNOSIS — E118 Type 2 diabetes mellitus with unspecified complications: Secondary | ICD-10-CM | POA: Diagnosis not present

## 2024-01-09 DIAGNOSIS — E785 Hyperlipidemia, unspecified: Secondary | ICD-10-CM

## 2024-01-09 DIAGNOSIS — Z8673 Personal history of transient ischemic attack (TIA), and cerebral infarction without residual deficits: Secondary | ICD-10-CM | POA: Diagnosis present

## 2024-01-09 DIAGNOSIS — I152 Hypertension secondary to endocrine disorders: Secondary | ICD-10-CM

## 2024-01-09 DIAGNOSIS — I69398 Other sequelae of cerebral infarction: Secondary | ICD-10-CM | POA: Diagnosis present

## 2024-01-09 DIAGNOSIS — Z6833 Body mass index (BMI) 33.0-33.9, adult: Secondary | ICD-10-CM | POA: Diagnosis present

## 2024-01-09 DIAGNOSIS — I5189 Other ill-defined heart diseases: Secondary | ICD-10-CM | POA: Diagnosis present

## 2024-01-09 DIAGNOSIS — E1169 Type 2 diabetes mellitus with other specified complication: Secondary | ICD-10-CM | POA: Diagnosis present

## 2024-01-09 NOTE — Patient Instructions (Signed)
 Medication Instructions:  Your physician recommends that you continue on your current medications as directed. Please refer to the Current Medication list given to you today.  *If you need a refill on your cardiac medications before your next appointment, please call your pharmacy*  Follow-Up: At [facility], you and your health needs are our priority.  As part of our continuing mission to provide you with exceptional heart care, we have created designated Provider Care Teams.  These Care Teams include your primary Cardiologist (physician) and Advanced Practice Providers (APPs -  Physician Assistants and Nurse Practitioners) who all work together to provide you with the care you need, when you need it.  Your next appointment:   6 month(s)  The format for your next appointment:   In Person  Provider:   Elder Negus, MD {  Other Instructions   1st Floor: - Lobby - Registration  - Pharmacy  - Lab - Cafe  2nd Floor: - PV Lab - Diagnostic Testing (echo, CT, nuclear med)  3rd Floor: - Vacant  4th Floor: - TCTS (cardiothoracic surgery) - AFib Clinic - Structural Heart Clinic - Vascular Surgery  - Vascular Ultrasound  5th Floor: - HeartCare Cardiology (general and EP) - Clinical Pharmacy for coumadin, hypertension, lipid, weight-loss medications, and med management appointments    Valet parking services will be available as well.

## 2024-01-24 ENCOUNTER — Other Ambulatory Visit: Payer: Self-pay

## 2024-01-24 ENCOUNTER — Other Ambulatory Visit: Payer: Self-pay | Admitting: Neurology

## 2024-01-25 ENCOUNTER — Other Ambulatory Visit: Payer: Self-pay

## 2024-01-25 NOTE — Telephone Encounter (Signed)
 Call and spoke with daughter brenda, she reports no seizures but changed the amount medication she is giving her mom. Currently taking 50 mg vimpat in the morning and 100 mg in the evening. Advised would let Dr. Teresa Coombs know and schedule for next week. Call to [facility] community pharmacy and reconciled medication list.

## 2024-01-25 NOTE — Telephone Encounter (Signed)
 Call to patient, spoke with Dr. [name]

## 2024-01-26 ENCOUNTER — Other Ambulatory Visit: Payer: Self-pay

## 2024-01-26 MED ORDER — LACOSAMIDE 50 MG PO TABS
ORAL_TABLET | ORAL | 0 refills | Status: DC
  Filled 2024-01-26: qty 27, 9d supply, fill #0

## 2024-01-31 ENCOUNTER — Other Ambulatory Visit: Payer: Self-pay

## 2024-02-01 ENCOUNTER — Encounter: Payer: Self-pay | Admitting: Neurology

## 2024-02-01 ENCOUNTER — Ambulatory Visit (INDEPENDENT_AMBULATORY_CARE_PROVIDER_SITE_OTHER): Admitting: Neurology

## 2024-02-01 ENCOUNTER — Other Ambulatory Visit: Payer: Self-pay

## 2024-02-01 VITALS — BP 118/81 | HR 87 | Ht 63.0 in | Wt 178.0 lb

## 2024-02-01 DIAGNOSIS — G40009 Localization-related (focal) (partial) idiopathic epilepsy and epileptic syndromes with seizures of localized onset, not intractable, without status epilepticus: Secondary | ICD-10-CM | POA: Diagnosis not present

## 2024-02-01 DIAGNOSIS — R4701 Aphasia: Secondary | ICD-10-CM

## 2024-02-01 MED ORDER — LACOSAMIDE 50 MG PO TABS
50.0000 mg | ORAL_TABLET | Freq: Two times a day (BID) | ORAL | 3 refills | Status: AC
  Filled 2024-02-01: qty 180, 90d supply, fill #0

## 2024-02-01 NOTE — Progress Notes (Signed)
 [facility]  PATIENT: [name] DOB: 11/29/1945  REFERRING CLINICIAN: Hoy Register, MD HISTORY FROM: Daughter [name]  REASON FOR VISIT: Seizure   HISTORICAL  CHIEF COMPLAINT:  Chief Complaint  Patient presents with   Room 13    Pt is here with her Daughter and [place] daughter. Pt's daughter states no recent seizures.    INTERVAL HISTORY 77/10/2024:  Patient presents today for follow-up, last visit was in November 2023.  At that time she was on Vimpat 100 mg in the morning of 150 mg in the evening.  Daughter tells me that with that dose, she was getting more dizzy, more somnolent, trouble with balance, she decreased it and currently she is taking 25 in the morning and 50 in the evening and she is tolerating the medication better with no side effects.  Denies any seizure, denies any fall, tells me that aphasia is still the same. No other concerns.     INTERVAL HISTORY 11/77/2023:  Patient presents today for follow up. She is accompanied by her daughter. Since last visit a year ago, she has been doing well, denies any seizure or seizure like activity. She is tolerating the Vimpat 100 mg AM and 150 mg PM. Denies any side effects from the medications. Daughter denies any new strokes, seizure or worsening aphasia. No recent falls.    INTERVAL HISTORY 11/77/2022 Patient present today for follow-up with daughter [name].  At last visit plan was to discontinue Keppra and start the patient on lacosamide due tO increased daytime sleepiness.  Since starting the lacosamide, daytime sleepiness has improved, she is more active and more alert during the daytime.  Denies any seizures since last visit.  Denies any side effect from the medication.  Daughter is satisfied with the current regimen and has no complaint or concern at the moment.  HISTORY OF PRESENT ILLNESS:  This is a 78 year old woman with past medical history of strokes, diabetes mellitus type 2 who is  presenting after a seizure in April. Daughter at bedside reports patient was outside smoking, then she called for help, grandson who was in the house went to help her, he noticed that she was dragging her left foot, she was not talking and fell off grandson's arms.  He helped her the floor and notice patient had her mouth opened and was staring. Episode lasted a couple minutes, by the time EMS arrived patient episode ended but she was confused and was not talking.  She was taken to the ED where she had a stroke work-up which was negative for acute stroke and had a EEG which showed a epileptogenic area in the left frontal and left temporal region.  She was started on Keppra 500 mg XR .  Daughter mentioned this was the first time the patient had an event like that, since being on Levetiracetam, she has not had any episode similar to the previous one.  But he has been complaining of unstable gait, dizziness, states things are dizzy, daughter also mentioned that patient has some visual hallucination, seeing two little girls talking to her, sometimes she will see someone talking to her daughter, she also had a complaint of joint pain, daughter is not sure if this is related to her known arthritis or this is a related to the Keppra.  Daughter mentioned the patient is still independent she is able to cook clean, bath and dress herself, she is not driving, she has issues with speech and comprehension but she is able  to follow simple commands    Handedness: Right handed   Seizure Type: Unclear, possibly focal seizure  Current frequency: Only once   Any injuries from seizures: None  Seizure risk factors: Multiple stroke, no family hx of seizure  Previous ASMs: Levetiracetam   Currenty ASMs: Lacosamide 100/150   ASMs side effects: Sleepiness, dizziness, visual hallucinations, mood changes   Brain Images: 1. No evidence of acute intracranial abnormality. Specifically, no acute infarct. 2. Multiple remote  infarcts, advanced chronic microvascular ischemic disease and atrophy. 3. Chronic left M1 occlusion better characterized on same day CTA.  Previous EEGs: rEEG: This study showed evidence of epileptogenicity as well as cortical dysfunction in left frontotemporal region likely secondary to underlying stroke.  No seizures or epileptiform discharges were seen throughout the recording   OTHER MEDICAL CONDITIONS: Multiple strokes, DMII, Dementia  REVIEW OF SYSTEMS: Unable to fully complete as patient has aphasia.   ALLERGIES: Allergies  Allergen Reactions   Penicillins Itching   Sulfa Antibiotics Other (See Comments)    Reaction not recalled, but patient was told she was allergic    HOME MEDICATIONS: Outpatient Medications Prior to Visit  Medication Sig Dispense Refill   acetaminophen (TYLENOL) 325 MG tablet Take 650 mg by mouth every 6 (six) hours as needed for pain or moderate pain.     atenolol (TENORMIN) 25 MG tablet Take 1 tablet (25 mg total) by mouth daily. 30 tablet 6   atorvastatin (LIPITOR) 40 MG tablet Take 1 tablet (40 mg total) by mouth daily. 30 tablet 6   Blood Glucose Monitoring Suppl (ACCU-CHEK AVIVA) device Use as instructed daily. 1 each 0   clopidogrel (PLAVIX) 75 MG tablet Take 1 tablet (75 mg total) by mouth daily. 30 tablet 6   diclofenac Sodium (VOLTAREN) 1 % GEL Apply 4 g topically 4 (four) times daily as needed (arthritis pain). 100 g 3   folic acid (FOLVITE) 1 MG tablet Take 1 tablet (1 mg total) by mouth daily. 30 tablet 0   furosemide (LASIX) 20 MG tablet Take 1 tablet (20 mg total) by mouth as needed. (Patient taking differently: Take 20 mg by mouth every other day. Take 1 Tablet Every Other Day.) 30 tablet 5   glucose blood (TRUE METRIX BLOOD GLUCOSE TEST) test strip Use as instructed 100 each 6   Lancets (ACCU-CHEK MULTICLIX) lancets Use as instructed 100 each 12   lisinopril (ZESTRIL) 10 MG tablet Take 1 tablet (10 mg total) by mouth daily. 30 tablet 6    metFORMIN (GLUCOPHAGE) 500 MG tablet Take 2 tablets (1,000 mg total) by mouth 2 (two) times daily with a meal. (Patient will need to make an office visit for next refill) 360 tablet 1   vitamin B-12 (CYANOCOBALAMIN) 1000 MCG tablet Take 1 tablet (1,000 mcg total) by mouth daily. 30 tablet 0   lacosamide (VIMPAT) 50 MG TABS tablet Take 1 tablet ( 50 mg total ) in the morning and 2 tablets (100 mg total) in the evening 27 tablet 0   nitroGLYCERIN (NITROSTAT) 0.4 MG SL tablet Place 1 tablet (0.4 mg total) under the tongue every 5 (five) minutes as needed for chest pain. 30 tablet 3   No facility-administered medications prior to visit.    PAST MEDICAL HISTORY: Past Medical History:  Diagnosis Date   AKI (acute kidney injury) 11/05/2021   AMS (altered mental status) 03/12/2021   Facial weakness 07/08/2014   Hyperlipidemia    Hypertension    Left leg weakness 07/08/2014  Osteoarthritis of hip 06/18/2021   Seizure 11/05/2021   STEMI (ST elevation myocardial infarction) 02/19/2022   Stroke    large left frontal lobe infarct with associated encephalomalacia. Additional redemonstrated infarcts in the right occipital lobe, left parietal lobe, posterior left temporal lobe, right cerebellum, and left greater than right basal ganglia   Subdural hemorrhage 07/08/2014   Type II diabetes mellitus 07/09/2014   Vascular dementia 07/13/2022    PAST SURGICAL HISTORY: Past Surgical History:  Procedure Laterality Date   CORONARY STENT INTERVENTION N/A 02/19/2022   Procedure: CORONARY STENT INTERVENTION;  Surgeon: Elder Negus, MD;  Location: [facility];  Service: Cardiovascular;  Laterality: N/A;   CORONARY/GRAFT ACUTE MI REVASCULARIZATION N/A 02/19/2022   Procedure: Coronary/Graft Acute MI Revascularization;  Surgeon: Elder Negus, MD;  Location: [facility];  Service: Cardiovascular;  Laterality: N/A;   LEFT HEART CATH AND CORONARY ANGIOGRAPHY N/A 02/19/2022   Procedure:  LEFT HEART CATH AND CORONARY ANGIOGRAPHY;  Surgeon: Elder Negus, MD;  Location: [facility];  Service: Cardiovascular;  Laterality: N/A;   NO PAST SURGERIES      FAMILY HISTORY: Family History  Problem Relation Age of Onset   Hyperlipidemia Mother    Hypertension Mother    Diabetes Mother    Stroke Father     SOCIAL HISTORY: Social History   Socioeconomic History   Marital status: Widowed    Spouse name: Not on file   Number of children: 3   Years of education: 12   Highest education level: High school graduate  Occupational History   Occupation: Retired  Tobacco Use   Smoking status: Former    Current packs/day: 0.00    Average packs/day: 1 pack/day for 48.0 years (48.0 ttl pk-yrs)    Types: Cigarettes    Start date: 62    Quit date: 2023    Years since quitting: 2.1   Smokeless tobacco: Never  Vaping Use   Vaping status: Never Used  Substance and Sexual Activity   Alcohol use: No   Drug use: No   Sexual activity: Not on file  Other Topics Concern   Not on file  Social History Narrative   Lives with daughter, [name], and her grandchildren.   Right-handed.   Three cups caffeine daily.   Social Drivers of Corporate investment banker Strain: Low Risk  (07/05/2023)   Overall Financial Resource Strain (CARDIA)    Difficulty of Paying Living Expenses: Not hard at all  Food Insecurity: No Food Insecurity (07/05/2023)   Hunger Vital Sign    Worried About Running Out of Food in the Last Year: Never true    Ran Out of Food in the Last Year: Never true  Transportation Needs: No Transportation Needs (07/05/2023)   PRAPARE - Administrator, Civil Service (Medical): No    Lack of Transportation (Non-Medical): No  Physical Activity: Inactive (07/05/2023)   Exercise Vital Sign    Days of Exercise per Week: 0 days    Minutes of Exercise per Session: 0 min  Stress: No Stress Concern Present (07/05/2023)   [employer] of Occupational Health  - Occupational Stress Questionnaire    Feeling of Stress : Not at all  Social Connections: Moderately Isolated (07/05/2023)   Social Connection and Isolation Panel [NHANES]    Frequency of Communication with Friends and Family: More than three times a week    Frequency of Social Gatherings with Friends and Family: Three times a  week    Attends Religious Services: 1 to 4 times per year    Active Member of Clubs or Organizations: No    Attends Banker Meetings: Never    Marital Status: Widowed  Intimate Partner Violence: Not At Risk (07/05/2023)   Humiliation, Afraid, Rape, and Kick questionnaire    Fear of Current or Ex-Partner: No    Emotionally Abused: No    Physically Abused: No    Sexually Abused: No     PHYSICAL EXAM  GENERAL EXAM/CONSTITUTIONAL: Vitals:  Vitals:   02/01/24 1446  BP: 118/81  Pulse: 87  Weight: 178 lb (80.7 kg)  Height: 5\' 3"  (1.6 m)    Body mass index is 31.53 kg/m. Wt Readings from Last 3 Encounters:  02/01/24 178 lb (80.7 kg)  01/09/24 178 lb 9.6 oz (81 kg)  10/13/23 180 lb (81.6 kg)   Patient is in no distress; well developed, nourished and groomed; neck is supple  MUSCULOSKELETAL: Gait, strength, tone, movements noted in Neurologic exam below  NEUROLOGIC: MENTAL STATUS: awake, alert, oriented to person,  Unable to provide history  language aphasic, able to name simple object, able to follow simple 1 step commands but not 2 step embedded commands, unable to repeat   CRANIAL NERVE:  2nd, 3rd, 4th, 6th - pupils equal and reactive to light, visual fields full to confrontation, extraocular muscles intact, no nystagmus 5th - facial sensation symmetric 7th - facial strength symmetric 8th - hearing intact 9th - palate elevates symmetrically, uvula midline 11th - shoulder shrug symmetric 12th - tongue protrusion midline  MOTOR:  normal bulk and tone, at least antigravity in the BUE and BLEs  SENSORY:  normal and symmetric to  light touch  GAIT/STATION:  Using a wheelchair today   DIAGNOSTIC DATA (LABS, IMAGING, TESTING) - I reviewed patient records, labs, notes, testing and imaging myself where available.  Lab Results  Component Value Date   WBC 9.2 05/28/2022   HGB 12.6 05/29/2022   HCT 37.0 05/29/2022   MCV 85.7 05/28/2022   PLT 290 05/28/2022      Component Value Date/Time   NA 141 08/11/2023 1026   K 5.1 08/11/2023 1026   CL 102 08/11/2023 1026   CO2 19 (L) 08/11/2023 1026   GLUCOSE 124 (H) 08/11/2023 1026   GLUCOSE 147 (H) 05/29/2022 0020   BUN 12 08/11/2023 1026   CREATININE 1.02 (H) 08/11/2023 1026   CALCIUM 9.1 08/11/2023 1026   PROT 6.9 02/10/2023 1030   ALBUMIN 4.4 02/10/2023 1030   AST 22 02/10/2023 1030   ALT 23 02/10/2023 1030   ALKPHOS 76 02/10/2023 1030   BILITOT 0.6 02/10/2023 1030   GFRNONAA 50 (L) 05/28/2022 2338   GFRAA 65 01/01/2021 1647   Lab Results  Component Value Date   CHOL 164 02/10/2023   HDL 55 02/10/2023   LDLCALC 90 02/10/2023   TRIG 107 02/10/2023   Lab Results  Component Value Date   HGBA1C 6.9 08/11/2023   Lab Results  Component Value Date   VITAMINB12 167 (L) 11/05/2021   Lab Results  Component Value Date   TSH 1.497 11/05/2021    MRI Brain 4/21/220 1. No evidence of acute intracranial abnormality. Specifically, no acute infarct. 2. 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 3. Chronic left M1 occlusion better characterized on same day CTA.  Routine EEG 03/13/21 This study showed evidence of epileptogenicity as well as cortical dysfunction in left frontotemporal region likely secondary to underlying  stroke.  No seizures or epileptiform discharges were seen throughout the recording  I personally reviewed brain Images and previous EEG reports.   ASSESSMENT AND PLAN  78 y.o. year old female here with past medical history of multiple strokes, diabetes mellitus type 2, seizure disorder who is  presenting for follow-up.  She is currently on lacosamide 25 mg AM and 50 mg PM, denies any side effect from the medication, denies any seizures since last visit. Will continue patient on the same dose and I will see her in 1 year for follow-up.   1. Localization-related idiopathic epilepsy and epileptic syndromes with seizures of localized onset, not intractable, without status epilepticus (HCC)   2. Aphasia     PLAN: Continue with Lacosamide 25 mg AM and 50 mg PM   Continue your other medications Return in 1 year    Per [facility] statutes, patients with seizures are not allowed to drive until they have been seizure-free for six months.  Other recommendations include using caution when using heavy equipment or power tools. Avoid working on ladders or at heights. Take showers instead of baths.  Do not swim alone.  Ensure the water temperature is not too high on the home water heater. Do not go swimming alone. Do not lock yourself in a room alone (i.e. bathroom). When caring for infants or small children, sit down when holding, feeding, or changing them to minimize risk of injury to the child in the event you have a seizure. Maintain good sleep hygiene. Avoid alcohol.  Also recommend adequate sleep, hydration, good diet and minimize stress.   During the Seizure  - First, ensure adequate ventilation and place patients on the floor on their left side  Loosen clothing around the neck and ensure the airway is patent. If the patient is clenching the teeth, do not force the mouth open with any object as this can cause severe damage - Remove all items from the surrounding that can be hazardous. The patient may be oblivious to what's happening and may not even know what he or she is doing. If the patient is confused and wandering, either gently guide him/her away and block access to outside areas - Reassure the individual and be comforting - Call 911. In most cases, the seizure ends before EMS  arrives. However, there are cases when seizures may last over 3 to 5 minutes. Or the individual may have developed breathing difficulties or severe injuries. If a pregnant patient or a person with diabetes develops a seizure, it is prudent to call an ambulance. - Finally, if the patient does not regain full consciousness, then call EMS. Most patients will remain confused for about 45 to 90 minutes after a seizure, so you must use judgment in calling for help. - Avoid restraints but make sure the patient is in a bed with padded side rails - Place the individual in a lateral position with the neck slightly flexed; this will help the saliva drain from the mouth and prevent the tongue from falling backward - Remove all nearby furniture and other hazards from the area - Provide verbal assurance as the individual is regaining consciousness - Provide the patient with privacy if possible - Call for help and start treatment as ordered by the caregiver   After the Seizure (Postictal Stage)  After a seizure, most patients experience confusion, fatigue, muscle pain and/or a headache. Thus, one should permit the individual to sleep. For the next few days, reassurance is essential.  Being calm and helping reorient the person is also of importance.  Most seizures are painless and end spontaneously. Seizures are not harmful to others but can lead to complications such as stress on the lungs, brain and the heart. Individuals with prior lung problems may develop labored breathing and respiratory distress.     No orders of the defined types were placed in this encounter.    Meds ordered this encounter  Medications   lacosamide (VIMPAT) 50 MG TABS tablet    Sig: Take 1 tablet (50 mg total) by mouth 2 (two) times daily. Take 1 tablet ( 50 mg total ) in the morning and 2 tablets (100 mg total) in the evening    Dispense:  180 tablet    Refill:  3     Return in about 1 year (around 01/31/2025).    Windell Norfolk, MD 02/01/2024, 3:06 PM  [facility] [address] [place], [place] [address] [phone]

## 2024-02-02 ENCOUNTER — Other Ambulatory Visit: Payer: Self-pay

## 2024-02-06 ENCOUNTER — Other Ambulatory Visit: Payer: Self-pay

## 2024-02-07 ENCOUNTER — Other Ambulatory Visit: Payer: Self-pay

## 2024-02-27 ENCOUNTER — Other Ambulatory Visit: Payer: Self-pay

## 2024-03-22 ENCOUNTER — Emergency Department (HOSPITAL_BASED_OUTPATIENT_CLINIC_OR_DEPARTMENT_OTHER)

## 2024-03-22 ENCOUNTER — Other Ambulatory Visit (HOSPITAL_BASED_OUTPATIENT_CLINIC_OR_DEPARTMENT_OTHER): Payer: Self-pay

## 2024-03-22 ENCOUNTER — Emergency Department (HOSPITAL_BASED_OUTPATIENT_CLINIC_OR_DEPARTMENT_OTHER)
Admission: EM | Admit: 2024-03-22 | Discharge: 2024-03-22 | Disposition: A | Discharge status: Home / Self Care | Attending: Emergency Medicine | Admitting: Emergency Medicine

## 2024-03-22 ENCOUNTER — Encounter (HOSPITAL_BASED_OUTPATIENT_CLINIC_OR_DEPARTMENT_OTHER): Payer: Self-pay | Admitting: Emergency Medicine

## 2024-03-22 ENCOUNTER — Other Ambulatory Visit: Payer: Self-pay

## 2024-03-22 DIAGNOSIS — F0390 Unspecified dementia without behavioral disturbance: Secondary | ICD-10-CM | POA: Insufficient documentation

## 2024-03-22 DIAGNOSIS — R778 Other specified abnormalities of plasma proteins: Secondary | ICD-10-CM | POA: Diagnosis not present

## 2024-03-22 DIAGNOSIS — E119 Type 2 diabetes mellitus without complications: Secondary | ICD-10-CM | POA: Diagnosis not present

## 2024-03-22 DIAGNOSIS — I69320 Aphasia following cerebral infarction: Secondary | ICD-10-CM | POA: Diagnosis not present

## 2024-03-22 DIAGNOSIS — R4182 Altered mental status, unspecified: Secondary | ICD-10-CM | POA: Insufficient documentation

## 2024-03-22 DIAGNOSIS — I110 Hypertensive heart disease with heart failure: Secondary | ICD-10-CM | POA: Insufficient documentation

## 2024-03-22 DIAGNOSIS — I5032 Chronic diastolic (congestive) heart failure: Secondary | ICD-10-CM | POA: Diagnosis not present

## 2024-03-22 DIAGNOSIS — G40909 Epilepsy, unspecified, not intractable, without status epilepticus: Secondary | ICD-10-CM | POA: Insufficient documentation

## 2024-03-22 DIAGNOSIS — R531 Weakness: Secondary | ICD-10-CM | POA: Diagnosis present

## 2024-03-22 DIAGNOSIS — Z79899 Other long term (current) drug therapy: Secondary | ICD-10-CM | POA: Insufficient documentation

## 2024-03-22 DIAGNOSIS — I700 Atherosclerosis of aorta: Secondary | ICD-10-CM | POA: Diagnosis not present

## 2024-03-22 DIAGNOSIS — I6782 Cerebral ischemia: Secondary | ICD-10-CM | POA: Diagnosis not present

## 2024-03-22 DIAGNOSIS — I10 Essential (primary) hypertension: Secondary | ICD-10-CM | POA: Insufficient documentation

## 2024-03-22 DIAGNOSIS — Z8673 Personal history of transient ischemic attack (TIA), and cerebral infarction without residual deficits: Secondary | ICD-10-CM | POA: Insufficient documentation

## 2024-03-22 DIAGNOSIS — Z7902 Long term (current) use of antithrombotics/antiplatelets: Secondary | ICD-10-CM | POA: Insufficient documentation

## 2024-03-22 DIAGNOSIS — Z7984 Long term (current) use of oral hypoglycemic drugs: Secondary | ICD-10-CM | POA: Insufficient documentation

## 2024-03-22 LAB — CBC WITH DIFFERENTIAL/PLATELET
Abs Immature Granulocytes: 0.01 10*3/uL (ref 0.00–0.07)
Basophils Absolute: 0 10*3/uL (ref 0.0–0.1)
Basophils Relative: 0 %
Eosinophils Absolute: 0.2 10*3/uL (ref 0.0–0.5)
Eosinophils Relative: 2 %
HCT: 41.3 % (ref 36.0–46.0)
Hemoglobin: 12.9 g/dL (ref 12.0–15.0)
Immature Granulocytes: 0 %
Lymphocytes Relative: 35 %
Lymphs Abs: 2.5 10*3/uL (ref 0.7–4.0)
MCH: 26.1 pg (ref 26.0–34.0)
MCHC: 31.2 g/dL (ref 30.0–36.0)
MCV: 83.6 fL (ref 80.0–100.0)
Monocytes Absolute: 0.5 10*3/uL (ref 0.1–1.0)
Monocytes Relative: 7 %
Neutro Abs: 3.9 10*3/uL (ref 1.7–7.7)
Neutrophils Relative %: 56 %
Platelets: 364 10*3/uL (ref 150–400)
RBC: 4.94 MIL/uL (ref 3.87–5.11)
RDW: 15.4 % (ref 11.5–15.5)
WBC: 7.1 10*3/uL (ref 4.0–10.5)
nRBC: 0 % (ref 0.0–0.2)

## 2024-03-22 LAB — URINE DRUG SCREEN
Amphetamines: NOT DETECTED
Barbiturates: NOT DETECTED
Benzodiazepines: NOT DETECTED
Cocaine: NOT DETECTED
Fentanyl: NOT DETECTED
Methadone Scn, Ur: NOT DETECTED
Opiates: NOT DETECTED
Tetrahydrocannabinol: NOT DETECTED

## 2024-03-22 LAB — COMPREHENSIVE METABOLIC PANEL WITH GFR
ALT: 28 U/L (ref 0–44)
AST: 37 U/L (ref 15–41)
Albumin: 4.2 g/dL (ref 3.5–5.0)
Alkaline Phosphatase: 72 U/L (ref 38–126)
Anion gap: 10 (ref 5–15)
BUN: 12 mg/dL (ref 8–23)
CO2: 27 mmol/L (ref 22–32)
Calcium: 9.9 mg/dL (ref 8.9–10.3)
Chloride: 104 mmol/L (ref 98–111)
Creatinine, Ser: 1.01 mg/dL — ABNORMAL HIGH (ref 0.44–1.00)
GFR, Estimated: 57 mL/min — ABNORMAL LOW (ref >60)
Glucose, Bld: 129 mg/dL — ABNORMAL HIGH (ref 70–99)
Potassium: 4.6 mmol/L (ref 3.5–5.1)
Sodium: 141 mmol/L (ref 135–145)
Total Bilirubin: 0.5 mg/dL (ref 0.0–1.2)
Total Protein: 7.1 g/dL (ref 6.5–8.1)

## 2024-03-22 LAB — PROTIME-INR
INR: 0.9 (ref 0.8–1.2)
Prothrombin Time: 12.8 s (ref 11.4–15.2)

## 2024-03-22 LAB — URINALYSIS, ROUTINE W REFLEX MICROSCOPIC
Bilirubin Urine: NEGATIVE
Glucose, UA: NEGATIVE mg/dL
Hgb urine dipstick: NEGATIVE
Ketones, ur: NEGATIVE mg/dL
Leukocytes,Ua: NEGATIVE
Nitrite: NEGATIVE
Protein, ur: NEGATIVE mg/dL
Specific Gravity, Urine: 1.009 (ref 1.005–1.030)
pH: 5.5 (ref 5.0–8.0)

## 2024-03-22 LAB — ETHANOL: Alcohol, Ethyl (B): <15 mg/dL (ref <15)

## 2024-03-22 LAB — TROPONIN T, HIGH SENSITIVITY
Troponin T High Sensitivity: 19 ng/L — ABNORMAL HIGH (ref <19)
Troponin T High Sensitivity: 21 ng/L — ABNORMAL HIGH (ref <19)

## 2024-03-22 LAB — CBG MONITORING, ED: Glucose-Capillary: 152 mg/dL — ABNORMAL HIGH (ref 70–99)

## 2024-03-22 LAB — APTT: aPTT: 24 s (ref 24–36)

## 2024-03-22 MED ORDER — LORAZEPAM 2 MG/ML IJ SOLN
1.0000 mg | Freq: Once | INTRAMUSCULAR | Status: AC
  Administered 2024-03-22: 1 mg via INTRAVENOUS
  Filled 2024-03-22: qty 1

## 2024-03-22 NOTE — Discharge Instructions (Addendum)
 Please read and follow all provided instructions.  Your diagnoses today include:  1. Generalized weakness    Tests performed today include: Complete blood cell count: Normal infection fighting cell count and hemoglobin Complete metabolic panel: No concerning findings Cardiac enzymes (blood test looking for stress on the heart): No sign of heart attack EKG: Unchanged from previous Chest x-ray: Some haziness noted, however you are not having symptoms of pneumonia, unlikely to be pneumonia. Urinalysis (urine test): No sign of infection MRI of the brain: No signs of acute stroke, multiple areas of previous stroke unchanged from at least 2023. Vital signs. See below for your results today.   Medications prescribed:  None  Take any prescribed medications only as directed.  Home care instructions:  Follow any educational materials contained in this packet.  BE VERY CAREFUL not to take multiple medicines containing Tylenol  (also called acetaminophen ). Doing so can lead to an overdose which can damage your liver and cause liver failure and possibly death.   Follow-up instructions: Please follow-up with your primary care provider in the next 2 days for further evaluation of your symptoms.   Return instructions:  Please return to the Emergency Department if you experience worsening symptoms.  Return if you have weakness in your arms or legs, slurred speech, trouble walking or talking, confusion, or trouble with your balance.  Return with fever, altered mental status, worsening weakness, trouble walking Please return if you have any other emergent concerns.  Additional Information:  Your vital signs today were: BP (!) 165/62   Pulse 77   Temp 98.2 F (36.8 C) (Oral)   Resp 20   LMP 09/28/2014 (Approximate)   SpO2 99%  If your blood pressure (BP) was elevated above 135/85 this visit, please have this repeated by your doctor within one month. --------------

## 2024-03-22 NOTE — ED Notes (Signed)
 Unsure baseline NIH d/t previous strokes and daughter unsure of prior deficits.

## 2024-03-22 NOTE — ED Notes (Addendum)
 Unable to currently perform swallow screen or a good NIH due to Pt not alert enough to follow directions... Provider informed.Misty AasAaron [name]

## 2024-03-22 NOTE — ED Notes (Signed)
 Provider at bedside

## 2024-03-22 NOTE — ED Provider Notes (Signed)
 [place] EMERGENCY DEPARTMENT AT [facility] Provider Note   CSN: [id number] Arrival date & time: 03/22/24  1153     History  Chief Complaint  Patient presents with   Weakness    [name] is a 78 y.o. female.  Patient with h/o stroke with some degree of aphasia (per outpt neuro note), diabetes mellitus type 2, HTN, high cholesterol, HFpEF, vascular dementia, seizure disorder -- presents to the ED for evaluation of altered mental status and generalized weakness.  Patient lives at home with her daughter.  Daughter reports that patient awoke around 8:30am, near baseline, perhaps more sleepy than normal.  Patient and family performed her normal routine.  Patient got up and ate, she was responsive at her baseline, afterwards took a shower and dressed herself.  Daughter was outside between 10 AM and 10:30 AM.  Patient reportedly came outside did not seem to be feeling well.  She was not responding like she normally would.  Daughter suggested that she go inside and get her shoes.  Patient went inside and and came back out without shoes on.  She seemed like she was slumping over more than typical.  She was not responding normally and would only say yes to questioning.  This prompted emergency department visit.   Neuro note 01/2024: "language aphasic, able to name simple object, able to follow simple 1 step commands but not 2 step embedded commands, unable to repeat"  MRI brain 05/2022: There is advanced atrophy. Old left frontal, right cerebellar, left basal ganglia infarcts.         Home Medications Prior to Admission medications   Medication Sig Start Date End Date Taking? Authorizing Provider  acetaminophen  (TYLENOL ) 325 MG tablet Take 650 mg by mouth every 6 (six) hours as needed for pain or moderate pain.    [provider]  atenolol  (TENORMIN ) 25 MG tablet Take 1 tablet (25 mg total) by mouth daily. 10/13/23   Tania Familia, NP  atorvastatin  (LIPITOR)  40 MG tablet Take 1 tablet (40 mg total) by mouth daily. 10/13/23   Tania Familia, NP  Blood Glucose Monitoring Suppl (ACCU-CHEK AVIVA) device Use as instructed daily. 01/09/20   Newlin, Enobong, MD  clopidogrel  (PLAVIX ) 75 MG tablet Take 1 tablet (75 mg total) by mouth daily. 10/13/23   Tania Familia, NP  diclofenac  Sodium (VOLTAREN ) 1 % GEL Apply 4 g topically 4 (four) times daily as needed (arthritis pain). 02/10/23   Newlin, Enobong, MD  folic acid  (FOLVITE ) 1 MG tablet Take 1 tablet (1 mg total) by mouth daily. 11/06/21   Audria Leather, MD  furosemide  (LASIX ) 20 MG tablet Take 1 tablet (20 mg total) by mouth as needed. Patient taking differently: Take 20 mg by mouth every other day. Take 1 Tablet Every Other Day. 10/13/23   Tania Familia, NP  glucose blood (TRUE METRIX BLOOD GLUCOSE TEST) test strip Use as instructed 01/09/20   Newlin, Enobong, MD  lacosamide  (VIMPAT ) 50 MG TABS tablet Take 1 tablet (50 mg total) by mouth 2 (two) times daily. Take 1 tablet ( 50 mg total ) in the morning and 2 tablets (100 mg total) in the evening 02/01/24 01/26/25  Cassandra Cleveland, MD  Lancets (ACCU-CHEK MULTICLIX) lancets Use as instructed 01/09/20   Newlin, Enobong, MD  lisinopril  (ZESTRIL ) 10 MG tablet Take 1 tablet (10 mg total) by mouth daily. 10/13/23 10/12/24  Tania Familia, NP  metFORMIN  (GLUCOPHAGE ) 500 MG tablet Take 2 tablets (1,000  mg total) by mouth 2 (two) times daily with a meal. (Patient will need to make an office visit for next refill) 08/11/23   Newlin, Enobong, MD  nitroGLYCERIN  (NITROSTAT ) 0.4 MG SL tablet Place 1 tablet (0.4 mg total) under the tongue every 5 (five) minutes as needed for chest pain. 03/09/22 11/29/22  Cantwell, Celeste C, PA-C  vitamin B-12 (CYANOCOBALAMIN ) 1000 MCG tablet Take 1 tablet (1,000 mcg total) by mouth daily. 11/06/21   Audria Leather, MD      Allergies    Penicillins and Sulfa antibiotics    Review of Systems   Review of Systems  Physical  Exam Updated Vital Signs BP (!) 189/94 (BP Location: Right Arm)   Pulse 88   Temp 98.7 F (37.1 C) (Temporal)   Resp 18   LMP 09/28/2014 (Approximate)   SpO2 99%  Physical Exam Vitals and nursing note reviewed.  Constitutional:      Appearance: She is well-developed.  HENT:     Head: Normocephalic and atraumatic.     Right Ear: Tympanic membrane normal.     Left Ear: Tympanic membrane normal.     Nose: Nose normal.     Mouth/Throat:     Pharynx: Uvula midline.  Eyes:     General: Lids are normal.     Extraocular Movements:     Right eye: No nystagmus.     Left eye: No nystagmus.     Conjunctiva/sclera: Conjunctivae normal.     Pupils: Pupils are equal, round, and reactive to light.  Cardiovascular:     Rate and Rhythm: Normal rate and regular rhythm.  Pulmonary:     Effort: Pulmonary effort is normal.     Breath sounds: Normal breath sounds.  Abdominal:     Palpations: Abdomen is soft.     Tenderness: There is no abdominal tenderness. There is no guarding or rebound.  Musculoskeletal:        General: No swelling or deformity.     Cervical back: Normal range of motion and neck supple. No tenderness or bony tenderness.  Skin:    General: Skin is warm and dry.  Neurological:     Mental Status: She is alert. She is confused.     GCS: GCS eye subscore is 4. GCS verbal subscore is 5. GCS motor subscore is 6.     Cranial Nerves: No cranial nerve deficit.     Motor: Weakness (Global) present.     Comments: Patient says yes to most questioning.  She will follow some basic commands such as smile, but seems to have some difficulty and delay in doing so.  She is able to lift her arms off the bed but with some difficulty, cannot keep them off the bed when assistance given to raise the arms.  Weakness seems to be the same on both sides and she seems globally weak.  She is able to wiggle her toes bilaterally.  She is unable to answer questions in regards to sensory exam or perform  coordination testing.     ED Results / Procedures / Treatments   Labs (all labs ordered are listed, but only abnormal results are displayed) Labs Reviewed - No data to display  EKG None  Radiology No results found.  Procedures Procedures    Medications Ordered in ED Medications - No data to display  ED Course/ Medical Decision Making/ A&P    Patient seen and examined shortly after arrival with Dr. Colonel Dears at bedside. Patient's daughter provides history.  Discussed with Dr. Colonel Dears, no code stroke at this time as patient does not have new focal symptoms.   Labs/EKG: Ordered stroke workup including PT/INR and APTT which is part of this order set.  Added troponin and UA as well.  Imaging: Ordered CT head.  Medications/Fluids: None ordered  Most recent vital signs reviewed and are as follows: BP (!) 189/94 (BP Location: Right Arm)   Pulse 88   Temp 98.7 F (37.1 C) (Temporal)   Resp 18   LMP 09/28/2014 (Approximate)   SpO2 99%   Initial impression: Altered mental status, generalized weakness, worse than usual aphasia.  Patient reports acute change around 10:30 AM.  CT pending.  MRI is available today if negative.  3:58 PM Reassessment performed. Patient appears much improved.  Patient is verbal, awake, following commands.  Per patient's daughter and other family member at bedside, she has returned to her usual self.  We discussed workup to this point.  We discussed MRI.  They do not feel that the patient will be able to lie flat for 10 minutes.  Given her improvement and return to baseline, it is very likely that she was initially postictal from a seizure.  We discussed whether or not to get MRI.  Patient's daughter is very reassured at this time and is comfortable with holding off on this.  Will give patient p.o. challenge and ambulate her to make sure that she is at baseline.  Labs personally reviewed and interpreted including: CBC unremarkable; CMP with glucose 129,  creatinine 1.01, normal liver function testing; first troponin mildly elevated at 19, awaiting second troponin; PT/INR and APTT are normal.  Imaging personally visualized and interpreted including: CT head with stable; chest x-ray with low lung volumes, suspect atelectasis, especially on left side, but cannot rule out pneumonia.  Clinically patient's symptoms not consistent with pneumonia.  No fever, cough, hypoxia, acute tachypnea.  Reviewed pertinent lab work and imaging with patient at bedside. Questions answered.   Most current vital signs reviewed and are as follows: BP (!) 165/118   Pulse 87   Temp 98.7 F (37.1 C) (Temporal)   Resp 19   LMP 09/28/2014 (Approximate)   SpO2 100%   Plan: Will give p.o. challenge and have patient ambulate.  Possible postictal state, cannot entirely rule out TIA.  After discussion with family, will defer further imaging unless symptoms worsen.  7:15 PM Reassessment performed. Patient appears stable, continuing to improve.  Imaging personally visualized and interpreted including: MRI of the brain, old infarcts noted, no acute infarct.  Reviewed pertinent lab work and imaging with patient and family at bedside. Questions answered.   We discussed reassuring workup with no evidence of new stroke.  We discussed possible etiology of symptoms today including seizure, TIA.  We discussed risks and benefits of discharged home as well as admission to the hospital for further evaluation and monitoring.  I asked the patient's family if they had any concerns in regards to the patient going home.  They do not have any safety concerns.  They are willing to watch her closely and are willing to return if symptoms recur.  We discussed signs and symptoms of stroke and the need to not ignore the symptoms if they occur.  Discussed that certain medications can only be given in the first few hours after stroke.  Patient's family is aware of this. Patient/family counseled to return  if they have weakness in their arms or legs, slurred speech, trouble walking or talking,  confusion, trouble with their balance, or if they have any other concerns. Patient verbalizes understanding and agrees with plan.   Most current vital signs reviewed and are as follows: BP (!) 165/62   Pulse 77   Temp 98.2 F (36.8 C) (Oral)   Resp 20   LMP 09/28/2014 (Approximate)   SpO2 99%   Plan: Discharge to home.   Prescriptions written for: None  Other home care instructions discussed: Very close monitoring  ED return instructions discussed: With stroke symptoms or new or worsening symptoms as above.  Follow-up instructions discussed: Patient encouraged to follow-up with their PCP in 2 days.                                 Medical Decision Making Amount and/or Complexity of Data Reviewed Labs: ordered. Radiology: ordered.  Risk Prescription drug management.   Patient with episode of altered mental status, change in behavior from baseline.  She was generally weak today without focal deficit.  There was concern for postictal state versus TIA or CVA versus other cause.  Patient without any obvious signs of infection.  Blood cell counts are reassuring.  Questionable out of atelectasis versus infection on chest x-ray, however patient without fever, cough, hypoxia or other symptoms to suggest pneumonia.  Clinically she does not have pneumonia.  Cardiac enzymes are stable and normal, no evidence of MI.  Patient had significant improvement during her 7-hour stay in the emergency department.  Family is comfortable with taking her home.  They seem very supportive and I think this is reasonable.  We discussed admission versus discharge.  Patient and family member would like to be discharged.  We discussed signs and symptoms to return and they seem reliable to return if these occur.        Final Clinical Impression(s) / ED Diagnoses Final diagnoses:  Generalized weakness    Rx / DC  Orders ED Discharge Orders     None         Lyna Sandhoff, PA-C 03/22/24 1919    Lowery Rue, DO 03/23/24 [phone]

## 2024-03-22 NOTE — ED Provider Notes (Signed)
 Supervised PA visit.  Patient here with weakness.  Really nonfocal exam.  She is answering yes to every question.  May be some difficulty with speech.  Speech is clear.  History of stroke, dementia, seizures.  Sounds like she woke up this morning more sleepy than usual but around 11:00 seem to maybe get more weak may be difficulty with speech.  Her seizures are typically not very obvious as well.  She is grossly weak on exam but pretty symmetric.  Hard to really get her to participate fully in an exam and overall exam is nonfocal.  She is on Plavix .  I do not think there is enough here to activate a code stroke she has chronic angina that sounds like as well.  EKG shows sinus rhythm.  No ischemic changes.  Will Broad workup to include infectious workup stroke workup/cardiac workup.  Does appear that we have MRI today so we should be able to get an MRI here but will start with head CT basic labs urinalysis chest x-ray.  Will see if she improves as is possible that maybe she had some sort of atypical seizure as well.  Please see PA note for further results, evaluation, disposition of the patient.  This chart was dictated using voice recognition software.  Despite best efforts to proofread,  errors can occur which can change the documentation meaning.    Lowery Rue, DO 03/22/24 1223

## 2024-03-22 NOTE — ED Notes (Signed)
 Discharge paperwork given and verbally understood.

## 2024-03-22 NOTE — ED Notes (Signed)
 Pt was able to walk 20 ft while holding on to side rails on wall and with help of nurse tech.

## 2024-03-22 NOTE — ED Notes (Signed)
 Md canceled code stroke that was activated by triage RN.

## 2024-03-22 NOTE — ED Notes (Signed)
Provider aware of hypertension.

## 2024-03-22 NOTE — ED Triage Notes (Signed)
 Daughter states patient started to become increasingly weak and difficulty speaking around 1100. Woke up normal per daughter.   Hx of strokes.

## 2024-03-30 ENCOUNTER — Other Ambulatory Visit: Payer: Self-pay

## 2024-04-30 ENCOUNTER — Other Ambulatory Visit: Payer: Self-pay

## 2024-05-09 ENCOUNTER — Other Ambulatory Visit: Payer: Self-pay

## 2024-05-09 ENCOUNTER — Other Ambulatory Visit: Payer: Self-pay | Admitting: Family Medicine

## 2024-05-09 DIAGNOSIS — E1169 Type 2 diabetes mellitus with other specified complication: Secondary | ICD-10-CM

## 2024-05-10 ENCOUNTER — Other Ambulatory Visit: Payer: Self-pay

## 2024-05-14 ENCOUNTER — Other Ambulatory Visit: Payer: Self-pay | Admitting: Adult Health

## 2024-05-14 ENCOUNTER — Other Ambulatory Visit: Payer: Self-pay

## 2024-05-14 ENCOUNTER — Other Ambulatory Visit: Payer: Self-pay | Admitting: Family Medicine

## 2024-05-14 DIAGNOSIS — E1169 Type 2 diabetes mellitus with other specified complication: Secondary | ICD-10-CM

## 2024-05-14 DIAGNOSIS — E1159 Type 2 diabetes mellitus with other circulatory complications: Secondary | ICD-10-CM

## 2024-05-16 ENCOUNTER — Other Ambulatory Visit: Payer: Self-pay

## 2024-05-16 MED ORDER — LISINOPRIL 10 MG PO TABS
10.0000 mg | ORAL_TABLET | Freq: Every day | ORAL | 3 refills | Status: DC
  Filled 2024-05-16: qty 90, 90d supply, fill #0

## 2024-05-16 NOTE — Progress Notes (Signed)
 This encounter was created in error - please disregard.

## 2024-05-24 ENCOUNTER — Other Ambulatory Visit: Payer: Self-pay | Admitting: Family Medicine

## 2024-05-24 ENCOUNTER — Other Ambulatory Visit: Payer: Self-pay

## 2024-05-24 DIAGNOSIS — E1169 Type 2 diabetes mellitus with other specified complication: Secondary | ICD-10-CM

## 2024-05-30 ENCOUNTER — Other Ambulatory Visit: Payer: Self-pay

## 2024-05-31 ENCOUNTER — Other Ambulatory Visit: Payer: Self-pay

## 2024-06-06 ENCOUNTER — Other Ambulatory Visit: Payer: Self-pay

## 2024-06-06 ENCOUNTER — Other Ambulatory Visit: Payer: Self-pay | Admitting: Family Medicine

## 2024-06-06 ENCOUNTER — Other Ambulatory Visit: Payer: Self-pay | Admitting: Adult Health

## 2024-06-06 DIAGNOSIS — E1159 Type 2 diabetes mellitus with other circulatory complications: Secondary | ICD-10-CM

## 2024-06-06 DIAGNOSIS — E1169 Type 2 diabetes mellitus with other specified complication: Secondary | ICD-10-CM

## 2024-06-06 NOTE — Telephone Encounter (Unsigned)
 Copied from CRM [id number]. Topic: Clinical - Medication Refill >> Jun 06, 2024 10:44 AM Marissa P wrote: Medication: metFORMIN  (GLUCOPHAGE ) 500 MG tablet  Has the patient contacted their pharmacy? Yes (Agent: If no, request that the patient contact the pharmacy for the refill. If patient does not wish to contact the pharmacy document the reason why and proceed with request.) (Agent: If yes, when and what did the pharmacy advise?)  This is the patient's preferred pharmacy:  [facility] [address] [place] [address] Phone: [phone] Fax: [phone]  Is this the correct pharmacy for this prescription? Yes If no, delete pharmacy and type the correct one.   Has the prescription been filled recently? Yes  Is the patient out of the medication? Yes  Has the patient been seen for an appointment in the last year OR does the patient have an upcoming appointment? Yes  Can we respond through MyChart? No  Agent: Please be advised that Rx refills may take up to 3 business days. We ask that you follow-up with your pharmacy.

## 2024-06-07 ENCOUNTER — Other Ambulatory Visit: Payer: Self-pay

## 2024-06-07 MED ORDER — ATENOLOL 25 MG PO TABS
25.0000 mg | ORAL_TABLET | Freq: Every day | ORAL | 7 refills | Status: DC
  Filled 2024-06-07: qty 30, 30d supply, fill #0

## 2024-06-07 MED ORDER — METFORMIN HCL 500 MG PO TABS
1000.0000 mg | ORAL_TABLET | Freq: Two times a day (BID) | ORAL | 0 refills | Status: DC
  Filled 2024-06-07: qty 120, 30d supply, fill #0

## 2024-06-07 NOTE — Telephone Encounter (Signed)
 Courtesy refill. Patient will need an office visit for additional refills.  Requested Prescriptions  Pending Prescriptions Disp Refills   metFORMIN  (GLUCOPHAGE ) 500 MG tablet 120 tablet 0    Sig: Take 2 tablets (1,000 mg total) by mouth 2 (two) times daily with a meal. (Patient will need to make an office visit for next refill)     Endocrinology:  Diabetes - Biguanides Failed - 06/07/2024  2:02 PM      Failed - Cr in normal range and within 360 days    Creatinine, Ser  Date Value Ref Range Status  03/22/2024 1.01 (H) 0.44 - 1.00 mg/dL Final         Failed - HBA1C is between 0 and 7.9 and within 180 days    HbA1c, POC (controlled diabetic range)  Date Value Ref Range Status  08/11/2023 6.9 0.0 - 7.0 % Final         Failed - eGFR in normal range and within 360 days    GFR calc Af Amer  Date Value Ref Range Status  01/01/2021 65 >59 mL/min/1.73 Final    Comment:    **In accordance with recommendations from the NKF-ASN Task force,**   [facility] is in the process of updating its eGFR calculation to the   2021 CKD-EPI creatinine equation that estimates kidney function   without a race variable.    GFR, Estimated  Date Value Ref Range Status  03/22/2024 57 (L) >60 mL/min Final    Comment:    (NOTE) Calculated using the CKD-EPI Creatinine Equation (2021)    eGFR  Date Value Ref Range Status  08/11/2023 57 (L) >59 mL/min/1.73 Final         Failed - B12 Level in normal range and within 720 days    Vitamin B-12  Date Value Ref Range Status  11/05/2021 167 (L) 180 - 914 pg/mL Final    Comment:    (NOTE) This assay is not validated for testing neonatal or myeloproliferative syndrome specimens for Vitamin B12 levels. Performed at [facility], [address], [place], [place] [address]          Failed - Valid encounter within last 6 months    Recent Outpatient Visits           10 months ago Type 2 diabetes mellitus with other specified complication, without long-term  current use of insulin  [facility])   [place] [facility] [place]. [facility] Delbert Clam, MD   1 year ago Type 2 diabetes mellitus with other specified complication, without long-term current use of insulin  [facility])   [place] [facility] Of [place]. [facility] Delbert Clam, MD   1 year ago Type 2 diabetes mellitus with other specified complication, without long-term current use of insulin  [facility])   [place] [facility] Of [place]. [facility] Delbert Clam, MD   2 years ago Type 2 diabetes mellitus with other specified complication, without long-term current use of insulin  [facility])    [facility] [place]. [facility] Theotis Haze ORN, NP   2 years ago Hospital discharge follow-up   [facility] [place]. [facility], Jon HERO, [place]       Future Appointments             In 3 weeks  Patwardhan, Newman PARAS, MD [facility] A Dept of [employer]. [facility], H&V            Passed - CBC within normal limits and completed in the last 12 months    WBC  Date Value Ref Range Status  03/22/2024 7.1 4.0 - 10.5 K/uL Final   RBC  Date Value Ref Range Status  03/22/2024 4.94 3.87 - 5.11 MIL/uL Final   Hemoglobin  Date Value Ref Range Status  03/22/2024 12.9 12.0 - 15.0 g/dL Final  97/89/7977 86.0 11.1 - 15.9 g/dL Final   HCT  Date Value Ref Range Status  03/22/2024 41.3 36.0 - 46.0 % Final   Hematocrit  Date Value Ref Range Status  01/01/2021 42.2 34.0 - 46.6 % Final   MCHC  Date Value Ref Range Status  03/22/2024 31.2 30.0 - 36.0 g/dL Final   [facility]  Date Value Ref Range Status  03/22/2024 26.1 26.0 - 34.0 pg Final   MCV  Date Value Ref Range Status  03/22/2024 83.6 80.0 - 100.0 fL Final  01/01/2021 84 79 - 97 fL Final   No results found for: PLTCOUNTKUC, LABPLAT, POCPLA RDW   Date Value Ref Range Status  03/22/2024 15.4 11.5 - 15.5 % Final  01/01/2021 14.1 11.7 - 15.4 % Final

## 2024-06-08 ENCOUNTER — Other Ambulatory Visit: Payer: Self-pay

## 2024-06-08 ENCOUNTER — Other Ambulatory Visit (HOSPITAL_COMMUNITY): Payer: Self-pay

## 2024-06-26 ENCOUNTER — Other Ambulatory Visit: Payer: Self-pay

## 2024-06-29 ENCOUNTER — Ambulatory Visit: Attending: Nurse Practitioner | Admitting: Nurse Practitioner

## 2024-06-29 ENCOUNTER — Other Ambulatory Visit: Payer: Self-pay

## 2024-06-29 ENCOUNTER — Encounter: Payer: Self-pay | Admitting: Nurse Practitioner

## 2024-06-29 ENCOUNTER — Other Ambulatory Visit (HOSPITAL_COMMUNITY): Payer: Self-pay

## 2024-06-29 VITALS — BP 141/83 | Resp 18 | Ht 63.0 in | Wt 176.4 lb

## 2024-06-29 DIAGNOSIS — Z87891 Personal history of nicotine dependence: Secondary | ICD-10-CM | POA: Insufficient documentation

## 2024-06-29 DIAGNOSIS — I69320 Aphasia following cerebral infarction: Secondary | ICD-10-CM | POA: Insufficient documentation

## 2024-06-29 DIAGNOSIS — Z8669 Personal history of other diseases of the nervous system and sense organs: Secondary | ICD-10-CM | POA: Diagnosis not present

## 2024-06-29 DIAGNOSIS — I10 Essential (primary) hypertension: Secondary | ICD-10-CM | POA: Insufficient documentation

## 2024-06-29 DIAGNOSIS — Z8673 Personal history of transient ischemic attack (TIA), and cerebral infarction without residual deficits: Secondary | ICD-10-CM

## 2024-06-29 DIAGNOSIS — E1169 Type 2 diabetes mellitus with other specified complication: Secondary | ICD-10-CM | POA: Insufficient documentation

## 2024-06-29 DIAGNOSIS — Z955 Presence of coronary angioplasty implant and graft: Secondary | ICD-10-CM | POA: Insufficient documentation

## 2024-06-29 DIAGNOSIS — Z7902 Long term (current) use of antithrombotics/antiplatelets: Secondary | ICD-10-CM | POA: Insufficient documentation

## 2024-06-29 DIAGNOSIS — I2510 Atherosclerotic heart disease of native coronary artery without angina pectoris: Secondary | ICD-10-CM | POA: Diagnosis not present

## 2024-06-29 DIAGNOSIS — Z79899 Other long term (current) drug therapy: Secondary | ICD-10-CM | POA: Diagnosis not present

## 2024-06-29 DIAGNOSIS — Z7984 Long term (current) use of oral hypoglycemic drugs: Secondary | ICD-10-CM | POA: Insufficient documentation

## 2024-06-29 DIAGNOSIS — F0150 Vascular dementia without behavioral disturbance: Secondary | ICD-10-CM | POA: Insufficient documentation

## 2024-06-29 DIAGNOSIS — R32 Unspecified urinary incontinence: Secondary | ICD-10-CM | POA: Diagnosis not present

## 2024-06-29 DIAGNOSIS — I69322 Dysarthria following cerebral infarction: Secondary | ICD-10-CM | POA: Diagnosis not present

## 2024-06-29 DIAGNOSIS — Z760 Encounter for issue of repeat prescription: Secondary | ICD-10-CM | POA: Insufficient documentation

## 2024-06-29 MED ORDER — ATORVASTATIN CALCIUM 40 MG PO TABS
40.0000 mg | ORAL_TABLET | Freq: Every day | ORAL | 1 refills | Status: DC
  Filled 2024-06-29: qty 90, 90d supply, fill #0

## 2024-06-29 MED ORDER — METFORMIN HCL 500 MG PO TABS
1000.0000 mg | ORAL_TABLET | Freq: Two times a day (BID) | ORAL | 1 refills | Status: AC
  Filled 2024-06-29: qty 360, 90d supply, fill #0
  Filled 2024-11-14 (×2): qty 360, 90d supply, fill #1

## 2024-06-29 NOTE — Progress Notes (Signed)
 Assessment & Plan:  [name] was seen today for hypertension.  Diagnoses and all orders for this visit:  Essential hypertension Blood pressure above goal here in office today.  Daughter reports lower readings at home. Will need to bring in home blood pressure monitoring device at next visit Continue atenolol  and lisinopril   Type 2 diabetes mellitus with other specified complication, without long-term current use of insulin  (HCC) -     metFORMIN  (GLUCOPHAGE ) 500 MG tablet; Take 2 tablets (1,000 mg total) by mouth 2 (two) times daily with a meal. -     atorvastatin  (LIPITOR) 40 MG tablet; Take 1 tablet (40 mg total) by mouth daily.  H/O: CVA (cerebrovascular accident) -     atorvastatin  (LIPITOR) 40 MG tablet; Take 1 tablet (40 mg total) by mouth daily. Continue Plavix  for stroke prevention Daughter declines DME/walker/cane  Declines CT lung cancer screening   Patient has been counseled on age-appropriate routine health concerns for screening and prevention. These are reviewed and up-to-date. Referrals have been placed accordingly. Immunizations are up-to-date or declined.    Subjective:   Chief Complaint  Patient presents with   Hypertension    History of Present Illness [name] is a 78 year old female with hypertension and heart problems who presents for medication refills. She is accompanied by her daughter today. She is a patient of [name]  History of type 2 diabetes mellitus (A1C dyspnea 6.9), hypertension, previous multiple CVAs with residual dysarthria, CAD (status post PCI to mid LCx in 01/2022), seizures, vascular dementia  Quit smoking in 2023 (smoked 1 pack a day since x 60 years).   She has an upcoming appointment with cardiology next week.   DM 2 She requires a refill for her metformin  today. A1c at goal Lab Results  Component Value Date   HGBA1C 6.9 08/11/2023     HTN Daughter states [name] blood pressure medication is being taken  regularly, but her blood pressure tends to be elevated if measured immediately after walking. Her caregiver suggests waiting about fifteen minutes post-exercise before taking it, as it is consistently elevated after walking. BP Readings from Last 3 Encounters:  06/29/24 (!) 141/83  03/22/24 (!) 149/81  02/01/24 118/81     Her history of stroke/dementia affects her gait, making her unsteady, though she does not use a walker and is not considered a fall risk per her daughter.  She has some receptive aphasia   Her caregiver purchases incontinence pads out of pocket as [name] does not qualify for Medicaid due to her assets exceeding the limit. Her caregiver confirms that she has no needs for durable medical equipment or additional home care services at this time.    Review of Systems  Constitutional:  Negative for fever, malaise/fatigue and weight loss.  HENT: Negative.  Negative for nosebleeds.   Eyes: Negative.  Negative for blurred vision, double vision and photophobia.  Respiratory: Negative.  Negative for cough and shortness of breath.   Cardiovascular: Negative.  Negative for chest pain, palpitations and leg swelling.  Gastrointestinal: Negative.  Negative for heartburn, nausea and vomiting.  Musculoskeletal: Negative.  Negative for myalgias.  Neurological:  Positive for weakness. Negative for dizziness, focal weakness, seizures and headaches.  Psychiatric/Behavioral: Negative.  Negative for suicidal ideas.     Past Medical History:  Diagnosis Date   AKI (acute kidney injury) 11/05/2021   AMS (altered mental status) 03/12/2021   Facial weakness 07/08/2014   Hyperlipidemia    Hypertension  Left leg weakness 07/08/2014   Osteoarthritis of hip 06/18/2021   Seizure 11/05/2021   STEMI (ST elevation myocardial infarction) 02/19/2022   Stroke    large left frontal lobe infarct with associated encephalomalacia. Additional redemonstrated infarcts in the right occipital lobe,  left parietal lobe, posterior left temporal lobe, right cerebellum, and left greater than right basal ganglia   Subdural hemorrhage 07/08/2014   Type II diabetes mellitus 07/09/2014   Vascular dementia 07/13/2022    Past Surgical History:  Procedure Laterality Date   CORONARY STENT INTERVENTION N/A 02/19/2022   Procedure: CORONARY STENT INTERVENTION;  Surgeon: Misty Newman PARAS, MD;  Location: [facility];  Service: Cardiovascular;  Laterality: N/A;   CORONARY/GRAFT ACUTE MI REVASCULARIZATION N/A 02/19/2022   Procedure: Coronary/Graft Acute MI Revascularization;  Surgeon: Misty Newman PARAS, MD;  Location: [facility];  Service: Cardiovascular;  Laterality: N/A;   LEFT HEART CATH AND CORONARY ANGIOGRAPHY N/A 02/19/2022   Procedure: LEFT HEART CATH AND CORONARY ANGIOGRAPHY;  Surgeon: Misty Newman PARAS, MD;  Location: [facility];  Service: Cardiovascular;  Laterality: N/A;   NO PAST SURGERIES      Family History  Problem Relation Age of Onset   Hyperlipidemia Mother    Hypertension Mother    Diabetes Mother    Stroke Father     Social History Reviewed with no changes to be made today.   Outpatient Medications Prior to Visit  Medication Sig Dispense Refill   acetaminophen  (TYLENOL ) 325 MG tablet Take 650 mg by mouth every 6 (six) hours as needed for pain or moderate pain.     atenolol  (TENORMIN ) 25 MG tablet Take 1 tablet (25 mg total) by mouth daily. 30 tablet 7   Blood Glucose Monitoring Suppl (ACCU-CHEK AVIVA) device Use as instructed daily. 1 each 0   clopidogrel  (PLAVIX ) 75 MG tablet Take 1 tablet (75 mg total) by mouth daily. 30 tablet 6   folic acid  (FOLVITE ) 1 MG tablet Take 1 tablet (1 mg total) by mouth daily. 30 tablet 0   furosemide  (LASIX ) 20 MG tablet Take 1 tablet (20 mg total) by mouth as needed. 30 tablet 5   glucose blood (TRUE METRIX BLOOD GLUCOSE TEST) test strip Use as instructed 100 each 6   lacosamide  (VIMPAT ) 50 MG TABS tablet Take 1  tablet (50 mg total) by mouth 2 (two) times daily. Take 1 tablet ( 50 mg total ) in the morning and 2 tablets (100 mg total) in the evening 180 tablet 3   Lancets (ACCU-CHEK MULTICLIX) lancets Use as instructed 100 each 12   lisinopril  (ZESTRIL ) 10 MG tablet Take 1 tablet (10 mg total) by mouth daily. 90 tablet 3   nitroGLYCERIN  (NITROSTAT ) 0.4 MG SL tablet Place 1 tablet (0.4 mg total) under the tongue every 5 (five) minutes as needed for chest pain. 30 tablet 3   atorvastatin  (LIPITOR) 40 MG tablet Take 1 tablet (40 mg total) by mouth daily. 30 tablet 6   metFORMIN  (GLUCOPHAGE ) 500 MG tablet Take 2 tablets (1,000 mg total) by mouth 2 (two) times daily with a meal. (Patient will need to make an office visit for next refill) 120 tablet 0   diclofenac  Sodium (VOLTAREN ) 1 % GEL Apply 4 g topically 4 (four) times daily as needed (arthritis pain). (Patient not taking: Reported on 06/29/2024) 100 g 3   vitamin B-12 (CYANOCOBALAMIN ) 1000 MCG tablet Take 1 tablet (1,000 mcg total) by mouth daily. 30 tablet 0   No facility-administered  medications prior to visit.    Allergies  Allergen Reactions   Penicillins Itching   Sulfa Antibiotics Other (See Comments)    Reaction not recalled, but patient was told she was allergic       Objective:    BP (!) 141/83 (BP Location: Left Arm, Patient Position: Sitting, Cuff Size: Normal)   Resp 18   Ht 5' 3 (1.6 m)   Wt 176 lb 6.4 oz (80 kg)   LMP 09/28/2014 (Approximate)   SpO2 98%   BMI 31.25 kg/m  Wt Readings from Last 3 Encounters:  06/29/24 176 lb 6.4 oz (80 kg)  02/01/24 178 lb (80.7 kg)  01/09/24 178 lb 9.6 oz (81 kg)    Physical Exam Vitals and nursing note reviewed.  Constitutional:      Appearance: She is well-developed.  HENT:     Head: Normocephalic and atraumatic.  Cardiovascular:     Rate and Rhythm: Normal rate and regular rhythm.     Heart sounds: Normal heart sounds. No murmur heard.    No friction rub. No gallop.  Pulmonary:      Effort: Pulmonary effort is normal. No tachypnea or respiratory distress.     Breath sounds: Normal breath sounds. No decreased breath sounds, wheezing, rhonchi or rales.  Chest:     Chest wall: No tenderness.  Abdominal:     General: Bowel sounds are normal.     Palpations: Abdomen is soft.  Musculoskeletal:        General: Normal range of motion.     Cervical back: Normal range of motion.  Skin:    General: Skin is warm and dry.  Neurological:     Mental Status: She is alert and oriented to person, place, and time.     Cranial Nerves: Cranial nerve deficit and dysarthria present.     Coordination: Coordination normal.  Psychiatric:        Behavior: Behavior normal. Behavior is cooperative.        Thought Content: Thought content normal.        Judgment: Judgment normal.          Patient has been counseled extensively about nutrition and exercise as well as the importance of adherence with medications and regular follow-up. The patient was given clear instructions to go to ER or return to medical center if symptoms don't improve, worsen or new problems develop. The patient verbalized understanding.   Follow-up: Return in about 3 months (around 09/29/2024).   Haze LELON Servant, FNP-BC [facility] and [facility] [place], [place] [phone]   06/29/2024, 10:47 AM  Discussed the use of AI scribe software for clinical note transcription with the patient, who gave verbal consent to proceed.

## 2024-06-29 NOTE — Progress Notes (Signed)
Daughter in room 

## 2024-07-04 ENCOUNTER — Encounter: Payer: Self-pay | Admitting: Cardiology

## 2024-07-04 ENCOUNTER — Other Ambulatory Visit: Payer: Self-pay

## 2024-07-04 ENCOUNTER — Ambulatory Visit: Attending: Cardiovascular Disease | Admitting: Cardiology

## 2024-07-04 VITALS — BP 128/70 | HR 70 | Ht 63.0 in | Wt 178.0 lb

## 2024-07-04 DIAGNOSIS — I10 Essential (primary) hypertension: Secondary | ICD-10-CM | POA: Diagnosis present

## 2024-07-04 DIAGNOSIS — I2510 Atherosclerotic heart disease of native coronary artery without angina pectoris: Secondary | ICD-10-CM | POA: Diagnosis present

## 2024-07-04 DIAGNOSIS — E782 Mixed hyperlipidemia: Secondary | ICD-10-CM | POA: Diagnosis present

## 2024-07-04 MED ORDER — ATENOLOL 25 MG PO TABS
25.0000 mg | ORAL_TABLET | Freq: Every day | ORAL | 3 refills | Status: AC
  Filled 2024-07-04 (×2): qty 90, 90d supply, fill #0
  Filled 2024-10-10: qty 90, 90d supply, fill #1

## 2024-07-04 MED ORDER — FUROSEMIDE 20 MG PO TABS
20.0000 mg | ORAL_TABLET | ORAL | 3 refills | Status: AC | PRN
  Filled 2024-07-04 (×2): qty 30, 30d supply, fill #0

## 2024-07-04 MED ORDER — CLOPIDOGREL BISULFATE 75 MG PO TABS
75.0000 mg | ORAL_TABLET | Freq: Every day | ORAL | 3 refills | Status: AC
  Filled 2024-07-04 – 2024-08-09 (×4): qty 90, 90d supply, fill #0
  Filled 2024-11-21: qty 90, 90d supply, fill #1

## 2024-07-04 MED ORDER — ATORVASTATIN CALCIUM 40 MG PO TABS
40.0000 mg | ORAL_TABLET | Freq: Every day | ORAL | 3 refills | Status: AC
  Filled 2024-10-31: qty 30, 30d supply, fill #0
  Filled 2024-11-21: qty 30, 30d supply, fill #1

## 2024-07-04 MED ORDER — LISINOPRIL 10 MG PO TABS
10.0000 mg | ORAL_TABLET | Freq: Every day | ORAL | 3 refills | Status: AC
  Filled 2024-07-04 (×2): qty 90, 90d supply, fill #0
  Filled 2024-10-31: qty 30, 30d supply, fill #1
  Filled 2024-11-21: qty 30, 30d supply, fill #2

## 2024-07-04 MED ORDER — NITROGLYCERIN 0.4 MG SL SUBL
0.4000 mg | SUBLINGUAL_TABLET | SUBLINGUAL | 3 refills | Status: AC | PRN
  Filled 2024-07-04 (×2): qty 25, 1d supply, fill #0

## 2024-07-04 NOTE — Progress Notes (Signed)
 Cardiology Office Note:  .   Date:  07/04/2024  ID:  [name], DOB 1946/09/28, MRN [id number] PCP: Delbert Clam, MD  [facility] Providers Cardiologist:  Newman Lawrence, MD PCP: Delbert Clam, MD  Chief Complaint  Patient presents with   Coronary Artery Disease          [name] is a 78 y.o. 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 Wendel by Lamarr Satterfield, PA in 09/2023 for evaluation and management of mitral regurgitation.  Although mild to moderate, she was thought to be asymptomatic from this due to exertional dyspnea.  Dr. Wendel felt that her anatomy and severity of mitral regurgitation was not suitable for transcatheter edge-to-edge repair.  Given her age, her moderate mixed valve disease with mitral stenosis and mitral regurgitation decision was also not likely suitable for surgical treatment.  She was recommended conservative management.  Patient is here today with her daughter.  She is fairly independent in spite of of her dementia issues.  Exertional dyspnea is stable and unchanged.  She denies any chest pain.  Blood pressure is well-controlled.     Vitals:   07/04/24 1142  BP: 128/70  Pulse: 70  SpO2: 98%      ROS      Studies Reviewed: .       Echocardiogram 11/2023:  1. Left ventricular ejection fraction, by estimation, is 55%. The left  ventricle has no regional wall motion abnormalities. Left ventricular  diastolic parameters are consistent with Grade II diastolic dysfunction  (pseudonormalization). GLS -16.4%.   2. Right ventricular systolic function is normal. The right ventricular  size is normal. There is normal pulmonary artery systolic pressure.   3. The mitral valve leaflets are moderate to severely calcified. There is  mild to  moderate mitral regurgitation. Mitral valve mean gradient of  .   4. The aortic valve is normal in structure. Aortic valve regurgitation is  not visualized. No aortic stenosis is present.   5. The inferior vena cava is normal in size with greater than 50%  respiratory variability, suggesting right atrial pressure of 3 mmHg.   Coronary intervention 02/19/2022: LM: Normal LAD: Minimal luminal irregularities Lcx: Prox 50% disease (non-culprit)        Prox-mid 95% stenosis (culprit) RCA: Prox 20% disease   LVEDP 27 mmHg   Successful percutaneous coronary intervention mid LCx        PTCA and stent placement 3.0 X 20 mm Synergy drug-eluting stent        Post dilatation with 3.25X15 mm [place] balloon at 16 atm 95%-->0% stenosis at culprit lesion TIMI III flow       No recent labs available   Physical Exam Vitals and nursing note reviewed.  Constitutional:      General: She is not in acute distress. Neck:     Vascular: No JVD.  Cardiovascular:     Rate and Rhythm: Normal rate and regular rhythm.     Heart sounds: Murmur heard.     High-pitched blowing holosystolic murmur is present with a grade of 2/6 at the apex.  Pulmonary:     Effort: Pulmonary effort is normal.     Breath sounds: Normal breath sounds. No wheezing or rales.  Musculoskeletal:     Right lower leg: No edema.  Left lower leg: No edema.      VISIT DIAGNOSES:   ICD-10-CM   1. Coronary artery disease involving native coronary artery of native heart without angina pectoris  I25.10 atenolol  (TENORMIN ) 25 MG tablet    atorvastatin  (LIPITOR) 40 MG tablet    clopidogrel  (PLAVIX ) 75 MG tablet    lisinopril  (ZESTRIL ) 10 MG tablet    Basic Metabolic Panel    Lipid panel    2. Essential hypertension  I10     3. Mixed hyperlipidemia  E78.2        [name] is a 78 y.o. female with hypertension, type 2 DM, CAD (STEMI and PPCI 01/2022), mixed mitral valve disease, h/o stroke 2015 w/subdural  hematoma, h/o seizures, dementia  Assessment & Plan  CAD: STEMI 02/2022. PPCI to Lcx She has been tolerating Plavix  monotherapy since then, okay to continue the same. Continue atenolol  25 mg daily Continue Lipitor 40 mg daily. Check lipid panel. If >70, increase Lipitor to 80 mg daily.   Mitral stenosis/mitral regurgitation: Mixed moderate disease.  Stable exertional dyspnea symptoms.  Euvolemic at baseline.  Not a candidate for percutaneous or surgical intervention given complex anatomy and advanced age.  Type 2 DM: Well controlled. Continue metformin , lisinopril    Hypertension: Well controlled at home. No change made today.     Meds ordered this encounter  Medications   atenolol  (TENORMIN ) 25 MG tablet    Sig: Take 1 tablet (25 mg total) by mouth daily.    Dispense:  90 tablet    Refill:  3   atorvastatin  (LIPITOR) 40 MG tablet    Sig: Take 1 tablet (40 mg total) by mouth daily.    Dispense:  90 tablet    Refill:  3   clopidogrel  (PLAVIX ) 75 MG tablet    Sig: Take 1 tablet (75 mg total) by mouth daily.    Dispense:  90 tablet    Refill:  3   furosemide  (LASIX ) 20 MG tablet    Sig: Take 1 tablet (20 mg total) by mouth as needed.    Dispense:  30 tablet    Refill:  3   lisinopril  (ZESTRIL ) 10 MG tablet    Sig: Take 1 tablet (10 mg total) by mouth daily.    Dispense:  90 tablet    Refill:  3   nitroGLYCERIN  (NITROSTAT ) 0.4 MG SL tablet    Sig: Place 1 tablet (0.4 mg total) under the tongue every 5 (five) minutes as needed for chest pain.    Dispense:  30 tablet    Refill:  3     F/u in 1 year  Signed, Newman JINNY Lawrence, MD

## 2024-07-04 NOTE — Patient Instructions (Signed)
 Medication Instructions:  Your physician recommends that you continue on your current medications as directed. Please refer to the Current Medication list given to you today.  *If you need a refill on your cardiac medications before your next appointment, please call your pharmacy*  Lab Work: Please complete a FASTING lipid panel and a BMET at any [facility] as soon as you are able. The orders have already been sent electronically, there is a [facility] located on the first floor of our building at [facility].   If you have labs (blood work) drawn today and your tests are completely normal, you will receive your results only by: MyChart Message (if you have MyChart) OR A paper copy in the mail If you have any lab test that is abnormal or we need to change your treatment, we will call you to review the results.  Testing/Procedures: None.  Follow-Up: At [facility], you and your health needs are our priority.  As part of our continuing mission to provide you with exceptional heart care, our providers are all part of one team.  This team includes your primary Cardiologist (physician) and Advanced Practice Providers or APPs (Physician Assistants and Nurse Practitioners) who all work together to provide you with the care you need, when you need it.  Your next appointment:   1 year(s)  Provider:   Newman JINNY Lawrence, MD

## 2024-07-06 ENCOUNTER — Other Ambulatory Visit: Payer: Self-pay

## 2024-07-10 ENCOUNTER — Ambulatory Visit: Payer: Medicare Other | Attending: Family Medicine

## 2024-07-10 VITALS — Ht 63.0 in | Wt 178.0 lb

## 2024-07-10 DIAGNOSIS — Z0000 Encounter for general adult medical examination without abnormal findings: Secondary | ICD-10-CM | POA: Diagnosis not present

## 2024-07-10 NOTE — Patient Instructions (Signed)
 [name] , Thank you for taking time out of your busy schedule to complete your Annual Wellness Visit with me. I enjoyed our conversation and look forward to speaking with you again next year. I, as well as your care team,  appreciate your ongoing commitment to your health goals. Please review the following plan we discussed and let me know if I can assist you in the future. Your Game plan/ To Do List    Referrals: If you haven't heard from the office you've been referred to, please reach out to them at the phone provided.   Follow up Visits: We will see or speak with you next year for your Next Medicare AWV with our clinical staff Have you seen your provider in the last 6 months (3 months if uncontrolled diabetes)? Yes  Clinician Recommendations:  Aim for 30 minutes of exercise or brisk walking, 6-8 glasses of water, and 5 servings of fruits and vegetables each day.       This is a list of the screenings recommended for you:  Health Maintenance  Topic Date Due   Eye exam for diabetics  Never done   COVID-19 Vaccine (1 - 2024-25 season) Never done   Complete foot exam   08/13/2023   Hemoglobin A1C  02/08/2024   Yearly kidney health urinalysis for diabetes  02/10/2024   Flu Shot  06/22/2024   Zoster (Shingles) Vaccine (2 of 2) 09/29/2024*   Screening for Lung Cancer  06/29/2025*   Pneumococcal Vaccine for age over 63 (1 of 2 - PCV) 06/29/2025*   Yearly kidney function blood test for diabetes  03/22/2025   Medicare Annual Wellness Visit  07/10/2025   DEXA scan (bone density measurement)  Completed   Hepatitis C Screening  Completed   HPV Vaccine  Aged Out   Meningitis B Vaccine  Aged Out   DTaP/Tdap/Td vaccine  Discontinued   Cologuard (Stool DNA test)  Discontinued  *Topic was postponed. The date shown is not the original due date.    Advanced directives: (Copy Requested) Please bring a copy of your health care power of attorney and living will to the office to be added to your  chart at your convenience. You can mail to [facility] [address] [place], [place] [address] or email to [email or web address] Advance Care Planning is important because it:  [x]  Makes sure you receive the medical care that is consistent with your values, goals, and preferences  [x]  It provides guidance to your family and loved ones and reduces their decisional burden about whether or not they are making the right decisions based on your wishes.  Follow the link provided in your after visit summary or read over the paperwork we have mailed to you to help you started getting your Advance Directives in place. If you need assistance in completing these, please reach out to us  so that we can help you!  See attachments for Preventive Care and Fall Prevention Tips.

## 2024-07-10 NOTE — Progress Notes (Cosign Needed Addendum)
 Because this visit was a virtual/telehealth visit,  certain criteria was not obtained, such a blood pressure, CBG if applicable, and timed get up and go. Any medications not marked as taking were not mentioned during the medication reconciliation part of the visit. Any vitals not documented were not able to be obtained due to this being a telehealth visit or patient was unable to self-report a recent blood pressure reading due to a lack of equipment at home via telehealth. Vitals that have been documented are verbally provided by the patient.   Subjective:   [name] is a 78 y.o. who presents for a Medicare Wellness preventive visit.  As a reminder, Annual Wellness Visits don't include a physical exam, and some assessments may be limited, especially if this visit is performed virtually. We may recommend an in-person follow-up visit with your provider if needed.  Visit Complete: Virtual I connected with  Babetta JONETTA Slicker on 07/10/24 by a audio enabled telemedicine application and verified that I am speaking with the correct person using two identifiers.  Patient Location: Home  Provider Location: Home Office  I discussed the limitations of evaluation and management by telemedicine. The patient expressed understanding and agreed to proceed.  Vital Signs: Because this visit was a virtual/telehealth visit, some criteria may be missing or patient reported. Any vitals not documented were not able to be obtained and vitals that have been documented are patient reported.  VideoDeclined- This patient declined Librarian, academic. Therefore the visit was completed with audio only.  Persons Participating in Visit: Daughter and patient was present during visit.  AWV Questionnaire: No: Patient Medicare AWV questionnaire was not completed prior to this visit.  Cardiac Risk Factors include: advanced age (>39men, >34 women);diabetes  mellitus;dyslipidemia;hypertension;obesity (BMI >30kg/m2);sedentary lifestyle;family history of premature cardiovascular disease     Objective:    Today's Vitals   07/10/24 0834  Weight: 178 lb (80.7 kg)  Height: 5' 3 (1.6 m)  PainSc: 0-No pain   Body mass index is 31.53 kg/m.     07/10/2024    8:39 AM 03/22/2024   12:12 PM 07/05/2023    8:53 PM 05/28/2022   11:03 PM 03/22/2022   11:17 PM 02/19/2022   10:19 AM 11/06/2021    3:32 PM  Advanced Directives  Does Patient Have a Medical Advance Directive? Yes No No No No Yes Unable to assess, patient is non-responsive or altered mental status  Type of Public librarian Power of [place];Living will     Healthcare Power of Attorney   Does patient want to make changes to medical advance directive?      No - Patient declined   Copy of Healthcare Power of Attorney in Chart? No - copy requested     No - copy requested   Would patient like information on creating a medical advance directive?  No - Patient declined Yes (MAU/Ambulatory/Procedural Areas - Information given) No - Patient declined       Current Medications (verified) Outpatient Encounter Medications as of 07/10/2024  Medication Sig   acetaminophen  (TYLENOL ) 325 MG tablet Take 650 mg by mouth every 6 (six) hours as needed for pain or moderate pain.   atenolol  (TENORMIN ) 25 MG tablet Take 1 tablet (25 mg total) by mouth daily.   atorvastatin  (LIPITOR) 40 MG tablet Take 1 tablet (40 mg total) by mouth daily.   Blood Glucose Monitoring Suppl (ACCU-CHEK AVIVA) device Use as instructed daily.   clopidogrel  (PLAVIX ) 75 MG tablet  Take 1 tablet (75 mg total) by mouth daily.   diclofenac  Sodium (VOLTAREN ) 1 % GEL Apply 4 g topically 4 (four) times daily as needed (arthritis pain). (Patient not taking: Reported on 07/04/2024)   folic acid  (FOLVITE ) 1 MG tablet Take 1 tablet (1 mg total) by mouth daily.   furosemide  (LASIX ) 20 MG tablet Take 1 tablet (20 mg total) by mouth as needed.    glucose blood (TRUE METRIX BLOOD GLUCOSE TEST) test strip Use as instructed   lacosamide  (VIMPAT ) 50 MG TABS tablet Take 1 tablet (50 mg total) by mouth 2 (two) times daily. Take 1 tablet ( 50 mg total ) in the morning and 2 tablets (100 mg total) in the evening   Lancets (ACCU-CHEK MULTICLIX) lancets Use as instructed   lisinopril  (ZESTRIL ) 10 MG tablet Take 1 tablet (10 mg total) by mouth daily.   metFORMIN  (GLUCOPHAGE ) 500 MG tablet Take 2 tablets (1,000 mg total) by mouth 2 (two) times daily with a meal.   nitroGLYCERIN  (NITROSTAT ) 0.4 MG SL tablet Place 1 tablet (0.4 mg total) under the tongue every 5 (five) minutes as needed for chest pain.   vitamin B-12 (CYANOCOBALAMIN ) 1000 MCG tablet Take 1 tablet (1,000 mcg total) by mouth daily.   No facility-administered encounter medications on file as of 07/10/2024.    Allergies (verified) Penicillins and Sulfa antibiotics   History: Past Medical History:  Diagnosis Date   AKI (acute kidney injury) 11/05/2021   AMS (altered mental status) 03/12/2021   Facial weakness 07/08/2014   Hyperlipidemia    Hypertension    Left leg weakness 07/08/2014   Osteoarthritis of hip 06/18/2021   Seizure 11/05/2021   STEMI (ST elevation myocardial infarction) 02/19/2022   Stroke    large left frontal lobe infarct with associated encephalomalacia. Additional redemonstrated infarcts in the right occipital lobe, left parietal lobe, posterior left temporal lobe, right cerebellum, and left greater than right basal ganglia   Subdural hemorrhage 07/08/2014   Type II diabetes mellitus 07/09/2014   Vascular dementia 07/13/2022   Past Surgical History:  Procedure Laterality Date   CORONARY STENT INTERVENTION N/A 02/19/2022   Procedure: CORONARY STENT INTERVENTION;  Surgeon: Elmira Newman PARAS, MD;  Location: [facility];  Service: Cardiovascular;  Laterality: N/A;   CORONARY/GRAFT ACUTE MI REVASCULARIZATION N/A 02/19/2022   Procedure: Coronary/Graft  Acute MI Revascularization;  Surgeon: Elmira Newman PARAS, MD;  Location: [facility];  Service: Cardiovascular;  Laterality: N/A;   LEFT HEART CATH AND CORONARY ANGIOGRAPHY N/A 02/19/2022   Procedure: LEFT HEART CATH AND CORONARY ANGIOGRAPHY;  Surgeon: Elmira Newman PARAS, MD;  Location: [facility];  Service: Cardiovascular;  Laterality: N/A;   NO PAST SURGERIES     Family History  Problem Relation Age of Onset   Hyperlipidemia Mother    Hypertension Mother    Diabetes Mother    Stroke Father    Social History   Socioeconomic History   Marital status: Widowed    Spouse name: Not on file   Number of children: 3   Years of education: 12   Highest education level: High school graduate  Occupational History   Occupation: Retired  Tobacco Use   Smoking status: Former    Current packs/day: 0.00    Average packs/day: 1 pack/day for 48.0 years (48.0 ttl pk-yrs)    Types: Cigarettes    Start date: 66    Quit date: 2023    Years since quitting: 2.6   Smokeless tobacco:  Never  Vaping Use   Vaping status: Never Used  Substance and Sexual Activity   Alcohol use: No   Drug use: No   Sexual activity: Not on file  Other Topics Concern   Not on file  Social History Narrative   Lives with daughter, Erminio, and her grandchildren.   Right-handed.   Three cups caffeine daily.   Social Drivers of Corporate investment banker Strain: Low Risk  (07/10/2024)   Overall Financial Resource Strain (CARDIA)    Difficulty of Paying Living Expenses: Not hard at all  Food Insecurity: No Food Insecurity (07/10/2024)   Hunger Vital Sign    Worried About Running Out of Food in the Last Year: Never true    Ran Out of Food in the Last Year: Never true  Transportation Needs: No Transportation Needs (07/10/2024)   PRAPARE - Administrator, Civil Service (Medical): No    Lack of Transportation (Non-Medical): No  Physical Activity: Inactive (07/10/2024)   Exercise Vital Sign     Days of Exercise per Week: 0 days    Minutes of Exercise per Session: 0 min  Stress: No Stress Concern Present (07/10/2024)   [employer] of Occupational Health - Occupational Stress Questionnaire    Feeling of Stress: Not at all  Social Connections: Moderately Isolated (07/10/2024)   Social Connection and Isolation Panel    Frequency of Communication with Friends and Family: More than three times a week    Frequency of Social Gatherings with Friends and Family: Three times a week    Attends Religious Services: 1 to 4 times per year    Active Member of Clubs or Organizations: No    Attends Banker Meetings: Never    Marital Status: Widowed    Tobacco Counseling Counseling given: Not Answered    Clinical Intake:  Pre-visit preparation completed: Yes  Pain : No/denies pain Pain Score: 0-No pain     BMI - recorded: 31.53 Nutritional Status: BMI > 30  Obese Nutritional Risks: None Diabetes: Yes CBG done?: No Did pt. bring in CBG monitor from home?: No  Lab Results  Component Value Date   HGBA1C 6.9 08/11/2023   HGBA1C 6.8 02/10/2023   HGBA1C 6.9 08/12/2022     How often do you need to have someone help you when you read instructions, pamphlets, or other written materials from your doctor or pharmacy?: 1 - Never What is the last grade level you completed in school?: HSG  Interpreter Needed?: No  Information entered by :: Kambre Messner N. Krishang Reading, LPN.   Activities of Daily Living     07/10/2024    8:39 AM  In your present state of health, do you have any difficulty performing the following activities:  Hearing? 0  Vision? 0  Difficulty concentrating or making decisions? 1  Comment DUE TO VASCULAR DEMENTIA  Walking or climbing stairs? 0  Dressing or bathing? 0  Doing errands, shopping? 1  Preparing Food and eating ? Y  Comment PREPARING FOOD  Using the Toilet? N  In the past six months, have you accidently leaked urine? Y  Comment WEARS  ADULT DEPENDS FOR PROTECTION  Do you have problems with loss of bowel control? Y  Comment WEARS ADULT DEPENDS FOR PROTECTION  Managing your Medications? Y  Managing your Finances? Y  Housekeeping or managing your Housekeeping? Y    Patient Care Team: Delbert Clam, MD as PCP - General (Family Medicine) Elmira Newman PARAS, MD as PCP -  Cardiology (Cardiology)  I have updated your Care Teams any recent Medical Services you may have received from other providers in the past year.     Assessment:   This is a routine wellness examination for Fritzi.  Hearing/Vision screen Hearing Screening - Comments:: Denies hearing difficulties.  Vision Screening - Comments:: Wears rx glasses - not up to date with routine eye exams with [facility]    Goals Addressed             This Visit's Progress    07/10/2024: To stay well.         Depression Screen     07/10/2024    8:40 AM 07/05/2023    8:50 PM 02/10/2023    9:38 AM 08/12/2022    8:57 AM 04/09/2021   11:34 AM 01/01/2021    4:13 PM 01/09/2020    2:14 PM  PHQ 2/9 Scores  PHQ - 2 Score 0 0 0 0 0 0 0  PHQ- 9 Score 1  6 3 2   0    Fall Risk     07/10/2024    8:39 AM 06/29/2024    9:57 AM 08/11/2023    9:29 AM 07/05/2023    8:52 PM 08/12/2022    8:46 AM  Fall Risk   Falls in the past year? 1 1 0 0 0  Number falls in past yr: 1 1 0 0 0  Injury with Fall? 0 0 0 0 0  Risk for fall due to : Impaired balance/gait;Impaired mobility Impaired balance/gait;Impaired mobility No Fall Risks Impaired mobility No Fall Risks  Follow up Falls evaluation completed Falls evaluation completed  Falls prevention discussed;Education provided;Falls evaluation completed     MEDICARE RISK AT HOME:  Medicare Risk at Home Any stairs in or around the home?: Yes (5 STEPS FROMT ENTRANCE; HAS HANDRAILS) If so, are there any without handrails?: No Home free of loose throw rugs in walkways, pet beds, electrical cords, etc?: Yes Adequate lighting in your  home to reduce risk of falls?: Yes Life alert?: No Use of a cane, walker or w/c?: No Grab bars in the bathroom?: Yes (SHOWER; ASSISTED BY DAUGHTER) Shower chair or bench in shower?: No Elevated toilet seat or a handicapped toilet?: No  TIMED UP AND GO:  Was the test performed?  No  Cognitive Function: Impaired: Patient has current diagnosis of cognitive impairment.    07/10/2024    8:40 AM  MMSE - Mini Mental State Exam  Not completed: Unable to complete        07/05/2023    8:52 PM  6CIT Screen  What Year? 0 points  What month? 0 points  What time? 0 points  Count back from 20 2 points  Months in reverse 4 points  Repeat phrase 4 points  Total Score 10 points    Immunizations Immunization History  Administered Date(s) Administered   Zoster Recombinant(Shingrix) 08/12/2022    Screening Tests Health Maintenance  Topic Date Due   OPHTHALMOLOGY EXAM  Never done   COVID-19 Vaccine (1 - 2024-25 season) Never done   FOOT EXAM  08/13/2023   HEMOGLOBIN A1C  02/08/2024   Diabetic kidney evaluation - Urine ACR  02/10/2024   INFLUENZA VACCINE  06/22/2024   Zoster Vaccines- Shingrix (2 of 2) 09/29/2024 (Originally 10/07/2022)   Lung Cancer Screening  06/29/2025 (Originally 07/24/2010)   Pneumococcal Vaccine: 50+ Years (1 of 2 - PCV) 06/29/2025 (Originally 11/12/1965)   Diabetic kidney evaluation - eGFR measurement  03/22/2025   Medicare Annual Wellness (AWV)  07/10/2025   DEXA SCAN  Completed   Hepatitis C Screening  Completed   HPV VACCINES  Aged Out   Meningococcal B Vaccine  Aged Out   DTaP/Tdap/Td  Discontinued   Fecal DNA (Cologuard)  Discontinued    Health Maintenance  Health Maintenance Due  Topic Date Due   OPHTHALMOLOGY EXAM  Never done   COVID-19 Vaccine (1 - 2024-25 season) Never done   FOOT EXAM  08/13/2023   HEMOGLOBIN A1C  02/08/2024   Diabetic kidney evaluation - Urine ACR  02/10/2024   INFLUENZA VACCINE  06/22/2024   Health Maintenance Items  Addressed: Yes Diabetic Foot Exam recommended, Diabetic Eye Exam, LabWork.  Additional Screening:  Vision Screening: Recommended annual ophthalmology exams for early detection of glaucoma and other disorders of the eye. Would you like a referral to an eye doctor? No    Dental Screening: Recommended annual dental exams for proper oral hygiene  Community Resource Referral / Chronic Care Management: CRR required this visit?  No   CCM required this visit?  No   Plan:    I have personally reviewed and noted the following in the patient's chart:   Medical and social history Use of alcohol, tobacco or illicit drugs  Current medications and supplements including opioid prescriptions. Patient is not currently taking opioid prescriptions. Functional ability and status Nutritional status Physical activity Advanced directives List of other physicians Hospitalizations, surgeries, and ER visits in previous 12 months Vitals Screenings to include cognitive, depression, and falls Referrals and appointments  In addition, I have reviewed and discussed with patient certain preventive protocols, quality metrics, and best practice recommendations. A written personalized care plan for preventive services as well as general preventive health recommendations were provided to patient.   Roz LOISE Fuller, LPN   [id number]/7974   After Visit Summary: (Declined) Due to this being a telephonic visit, with patients personalized plan was offered to patient but patient Declined AVS at this time   Notes: Diabetic Foot Exam recommended, Diabetic Eye Exam, LabWork. Patient has not seen [facility] in over three years.

## 2024-08-09 ENCOUNTER — Other Ambulatory Visit: Payer: Self-pay

## 2024-09-28 ENCOUNTER — Telehealth: Payer: Self-pay | Admitting: Family Medicine

## 2024-09-28 NOTE — Telephone Encounter (Signed)
 Pt unconfirmed appt lvm

## 2024-10-01 ENCOUNTER — Encounter: Payer: Self-pay | Admitting: Family Medicine

## 2024-10-01 ENCOUNTER — Ambulatory Visit: Attending: Family Medicine | Admitting: Family Medicine

## 2024-10-01 VITALS — BP 116/78 | HR 72 | Temp 97.7°F | Ht 63.0 in | Wt 175.0 lb

## 2024-10-01 DIAGNOSIS — Z955 Presence of coronary angioplasty implant and graft: Secondary | ICD-10-CM | POA: Insufficient documentation

## 2024-10-01 DIAGNOSIS — Z87891 Personal history of nicotine dependence: Secondary | ICD-10-CM | POA: Insufficient documentation

## 2024-10-01 DIAGNOSIS — Z7902 Long term (current) use of antithrombotics/antiplatelets: Secondary | ICD-10-CM | POA: Diagnosis not present

## 2024-10-01 DIAGNOSIS — G40909 Epilepsy, unspecified, not intractable, without status epilepticus: Secondary | ICD-10-CM | POA: Insufficient documentation

## 2024-10-01 DIAGNOSIS — E119 Type 2 diabetes mellitus without complications: Secondary | ICD-10-CM | POA: Diagnosis not present

## 2024-10-01 DIAGNOSIS — F0150 Vascular dementia without behavioral disturbance: Secondary | ICD-10-CM | POA: Diagnosis not present

## 2024-10-01 DIAGNOSIS — R471 Dysarthria and anarthria: Secondary | ICD-10-CM | POA: Insufficient documentation

## 2024-10-01 DIAGNOSIS — I10 Essential (primary) hypertension: Secondary | ICD-10-CM | POA: Insufficient documentation

## 2024-10-01 DIAGNOSIS — I340 Nonrheumatic mitral (valve) insufficiency: Secondary | ICD-10-CM | POA: Insufficient documentation

## 2024-10-01 DIAGNOSIS — Z79899 Other long term (current) drug therapy: Secondary | ICD-10-CM | POA: Insufficient documentation

## 2024-10-01 DIAGNOSIS — F01B0 Vascular dementia, moderate, without behavioral disturbance, psychotic disturbance, mood disturbance, and anxiety: Secondary | ICD-10-CM

## 2024-10-01 DIAGNOSIS — Z7182 Exercise counseling: Secondary | ICD-10-CM | POA: Insufficient documentation

## 2024-10-01 DIAGNOSIS — E1169 Type 2 diabetes mellitus with other specified complication: Secondary | ICD-10-CM | POA: Insufficient documentation

## 2024-10-01 DIAGNOSIS — I252 Old myocardial infarction: Secondary | ICD-10-CM | POA: Diagnosis not present

## 2024-10-01 DIAGNOSIS — Z7984 Long term (current) use of oral hypoglycemic drugs: Secondary | ICD-10-CM | POA: Insufficient documentation

## 2024-10-01 DIAGNOSIS — R0600 Dyspnea, unspecified: Secondary | ICD-10-CM | POA: Diagnosis not present

## 2024-10-01 DIAGNOSIS — Z8673 Personal history of transient ischemic attack (TIA), and cerebral infarction without residual deficits: Secondary | ICD-10-CM | POA: Insufficient documentation

## 2024-10-01 DIAGNOSIS — R569 Unspecified convulsions: Secondary | ICD-10-CM

## 2024-10-01 DIAGNOSIS — I152 Hypertension secondary to endocrine disorders: Secondary | ICD-10-CM

## 2024-10-01 DIAGNOSIS — I2510 Atherosclerotic heart disease of native coronary artery without angina pectoris: Secondary | ICD-10-CM | POA: Insufficient documentation

## 2024-10-01 DIAGNOSIS — I69322 Dysarthria following cerebral infarction: Secondary | ICD-10-CM

## 2024-10-01 DIAGNOSIS — I2121 ST elevation (STEMI) myocardial infarction involving left circumflex coronary artery: Secondary | ICD-10-CM

## 2024-10-01 LAB — POCT GLYCOSYLATED HEMOGLOBIN (HGB A1C): HbA1c, POC (controlled diabetic range): 6.8 % (ref 0.0–7.0)

## 2024-10-01 NOTE — Patient Instructions (Signed)
 VISIT SUMMARY:  Today, you came in for a follow-up visit to manage your diabetes, coronary artery disease, and seizure disorder. You were accompanied by your daughter, who is your primary caregiver. We reviewed your current medications and overall health status.  YOUR PLAN:  -TYPE 2 DIABETES MELLITUS: Your diabetes is well-controlled with a recent A1c of 6.8%. Continue taking metformin  1000 mg twice daily with meals and regularly monitor your blood glucose levels.  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 Continue taking atorvastatin  and clopidogrel  as prescribed. Use furosemide  20 mg as needed for fluid retention and monitor for improvement in your breathing. Avoid excessive exertion and park close to your destinations. If you experience chest pain, take nitroglycerin  and seek emergency care.  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 Continue taking lacosamide  at the reduced dose as discussed with your neurologist.  -GENERAL HEALTH MAINTENANCE: You are overdue for an eye exam. Please schedule an eye exam as soon as possible.  INSTRUCTIONS:  Please schedule an eye exam as soon as possible.

## 2024-10-01 NOTE — Progress Notes (Signed)
 Subjective:  Patient ID: [name], female    DOB: 08-Nov-1946  Age: 78 y.o. MRN: [id number]  CC: Medical Management of Chronic Issues     Discussed the use of AI scribe software for clinical note transcription with the patient, who gave verbal consent to proceed.  History of Present Illness [name] is a 78 year old female with a history of type 2 diabetes mellitus (A1C dyspnea 6.9), hypertension, previous multiple CVAs with residual dysarthria, CAD (status post PCI to mid LCx in 01/2022), seizures, vascular dementia  Quit smoking in 2023 (smoked 1 pack a day since x 60 years, declines lung cancer screening)who presents for a follow-up visit. She is accompanied by her daughter, who is her primary caregiver.  Her diabetes management includes metformin  1000 mg twice daily, with a recent A1c of 6.8. She experiences no hypoglycemia, dizziness, or lightheadedness.  Denies presence of neuropathy.  For coronary artery disease, she experiences dyspnea at rest and with exertion, and fatigue after minimal exertion. She takes cholesterol medication and Plavix . Her daughter monitors for fluid retention, and she uses furosemide  20 mg as needed, typically twice a week when she is dyspneic as per daughter she does not have typical pedal edema. Notes from last cardiology visit reviewed regarding mitral valve regurg and due to age and anatomy no surgical intervention recommended.  She has a history of stroke and is on lacosamide  for seizure control, though not at the prescribed dose due to sedation and incontinence. Despite the reduced dose, she has not had seizures. She lives with her daughters and her grandson.  History is provided mostly by daughter.   Past Medical History:  Diagnosis Date   AKI (acute kidney injury) 11/05/2021   AMS (altered mental status) 03/12/2021   Facial weakness 07/08/2014   Hyperlipidemia    Hypertension    Left leg weakness 07/08/2014   Osteoarthritis  of hip 06/18/2021   Seizure 11/05/2021   STEMI (ST elevation myocardial infarction) 02/19/2022   Stroke    large left frontal lobe infarct with associated encephalomalacia. Additional redemonstrated infarcts in the right occipital lobe, left parietal lobe, posterior left temporal lobe, right cerebellum, and left greater than right basal ganglia   Subdural hemorrhage 07/08/2014   Type II diabetes mellitus 07/09/2014   Vascular dementia 07/13/2022    Past Surgical History:  Procedure Laterality Date   CORONARY STENT INTERVENTION N/A 02/19/2022   Procedure: CORONARY STENT INTERVENTION;  Surgeon: Elmira Newman PARAS, MD;  Location: [facility];  Service: Cardiovascular;  Laterality: N/A;   CORONARY/GRAFT ACUTE MI REVASCULARIZATION N/A 02/19/2022   Procedure: Coronary/Graft Acute MI Revascularization;  Surgeon: Elmira Newman PARAS, MD;  Location: [facility];  Service: Cardiovascular;  Laterality: N/A;   LEFT HEART CATH AND CORONARY ANGIOGRAPHY N/A 02/19/2022   Procedure: LEFT HEART CATH AND CORONARY ANGIOGRAPHY;  Surgeon: Elmira Newman PARAS, MD;  Location: [facility];  Service: Cardiovascular;  Laterality: N/A;   NO PAST SURGERIES      Family History  Problem Relation Age of Onset   Hyperlipidemia Mother    Hypertension Mother    Diabetes Mother    Stroke Father     Social History   Socioeconomic History   Marital status: Widowed    Spouse name: Not on file   Number of children: 3   Years of education: 12   Highest education level: High school graduate  Occupational History   Occupation: Retired  Tobacco Use  Smoking status: Former    Current packs/day: 0.00    Average packs/day: 1 pack/day for 48.0 years (48.0 ttl pk-yrs)    Types: Cigarettes    Start date: 60    Quit date: 2023    Years since quitting: 2.8   Smokeless tobacco: Never  Vaping Use   Vaping status: Never Used  Substance and Sexual Activity   Alcohol use: No   Drug use: No    Sexual activity: Not on file  Other Topics Concern   Not on file  Social History Narrative   Lives with daughter, Erminio, and her grandchildren.   Right-handed.   Three cups caffeine daily.   Social Drivers of Corporate Investment Banker Strain: Low Risk  (07/10/2024)   Overall Financial Resource Strain (CARDIA)    Difficulty of Paying Living Expenses: Not hard at all  Food Insecurity: No Food Insecurity (07/10/2024)   Hunger Vital Sign    Worried About Running Out of Food in the Last Year: Never true    Ran Out of Food in the Last Year: Never true  Transportation Needs: No Transportation Needs (07/10/2024)   PRAPARE - Administrator, Civil Service (Medical): No    Lack of Transportation (Non-Medical): No  Physical Activity: Inactive (07/10/2024)   Exercise Vital Sign    Days of Exercise per Week: 0 days    Minutes of Exercise per Session: 0 min  Stress: No Stress Concern Present (07/10/2024)   [employer] of Occupational Health - Occupational Stress Questionnaire    Feeling of Stress: Not at all  Social Connections: Moderately Isolated (07/10/2024)   Social Connection and Isolation Panel    Frequency of Communication with Friends and Family: More than three times a week    Frequency of Social Gatherings with Friends and Family: Three times a week    Attends Religious Services: 1 to 4 times per year    Active Member of Clubs or Organizations: No    Attends Banker Meetings: Never    Marital Status: Widowed    Allergies  Allergen Reactions   Penicillins Itching   Sulfa Antibiotics Other (See Comments)    Reaction not recalled, but patient was told she was allergic    Outpatient Medications Prior to Visit  Medication Sig Dispense Refill   acetaminophen  (TYLENOL ) 325 MG tablet Take 650 mg by mouth every 6 (six) hours as needed for pain or moderate pain.     atenolol  (TENORMIN ) 25 MG tablet Take 1 tablet (25 mg total) by mouth daily. 90 tablet 3    atorvastatin  (LIPITOR) 40 MG tablet Take 1 tablet (40 mg total) by mouth daily. 90 tablet 3   Blood Glucose Monitoring Suppl (ACCU-CHEK AVIVA) device Use as instructed daily. 1 each 0   clopidogrel  (PLAVIX ) 75 MG tablet Take 1 tablet (75 mg total) by mouth daily. 90 tablet 3   folic acid  (FOLVITE ) 1 MG tablet Take 1 tablet (1 mg total) by mouth daily. 30 tablet 0   furosemide  (LASIX ) 20 MG tablet Take 1 tablet (20 mg total) by mouth as needed. 30 tablet 3   glucose blood (TRUE METRIX BLOOD GLUCOSE TEST) test strip Use as instructed 100 each 6   lacosamide  (VIMPAT ) 50 MG TABS tablet Take 1 tablet (50 mg total) by mouth 2 (two) times daily. Take 1 tablet ( 50 mg total ) in the morning and 2 tablets (100 mg total) in the evening 180 tablet 3   Lancets (  ACCU-CHEK MULTICLIX) lancets Use as instructed 100 each 12   lisinopril  (ZESTRIL ) 10 MG tablet Take 1 tablet (10 mg total) by mouth daily. 90 tablet 3   metFORMIN  (GLUCOPHAGE ) 500 MG tablet Take 2 tablets (1,000 mg total) by mouth 2 (two) times daily with a meal. 360 tablet 1   nitroGLYCERIN  (NITROSTAT ) 0.4 MG SL tablet Place 1 tablet (0.4 mg total) under the tongue every 5 (five) minutes as needed for chest pain. 30 tablet 3   vitamin B-12 (CYANOCOBALAMIN ) 1000 MCG tablet Take 1 tablet (1,000 mcg total) by mouth daily. 30 tablet 0   diclofenac  Sodium (VOLTAREN ) 1 % GEL Apply 4 g topically 4 (four) times daily as needed (arthritis pain). (Patient not taking: Reported on 10/01/2024) 100 g 3   No facility-administered medications prior to visit.     ROS Review of Systems  Constitutional:  Negative for activity change and appetite change.  HENT:  Negative for sinus pressure and sore throat.   Respiratory:  Positive for shortness of breath. Negative for chest tightness and wheezing.   Cardiovascular:  Negative for chest pain and palpitations.  Gastrointestinal:  Negative for abdominal distention, abdominal pain and constipation.  Genitourinary:  Negative.   Musculoskeletal: Negative.   Psychiatric/Behavioral:  Negative for behavioral problems and dysphoric mood.     Objective:  BP 116/78   Pulse 72   Temp 97.7 F (36.5 C) (Oral)   Ht 5' 3 (1.6 m)   Wt 175 lb (79.4 kg)   LMP 09/28/2014 (Approximate)   SpO2 100%   BMI 31.00 kg/m      10/01/2024   11:08 AM 07/10/2024    8:34 AM 07/04/2024   11:42 AM  BP/Weight  Systolic BP 116  871  Diastolic BP 78  70  Wt. (Lbs) 175 178 178  BMI 31 kg/m2 31.53 kg/m2 31.53 kg/m2      Physical Exam Constitutional:      Appearance: She is well-developed.  Cardiovascular:     Rate and Rhythm: Normal rate.     Heart sounds: Normal heart sounds. No murmur heard. Pulmonary:     Effort: Pulmonary effort is normal.     Breath sounds: Normal breath sounds. No wheezing or rales.  Chest:     Chest wall: No tenderness.  Abdominal:     General: Bowel sounds are normal. There is no distension.     Palpations: Abdomen is soft. There is no mass.     Tenderness: There is no abdominal tenderness.  Musculoskeletal:        General: Normal range of motion.     Right lower leg: No edema.     Left lower leg: No edema.  Neurological:     Mental Status: She is alert. Mental status is at baseline.  Psychiatric:        Mood and Affect: Mood normal.    Diabetic Foot Exam - Simple   Simple Foot Form Diabetic Foot exam was performed with the following findings: Yes 10/01/2024 11:29 AM  Visual Inspection No deformities, no ulcerations, no other skin breakdown bilaterally: Yes Sensation Testing Intact to touch and monofilament testing bilaterally: Yes Pulse Check Posterior Tibialis and Dorsalis pulse intact bilaterally: Yes Comments         Latest Ref Rng & Units 03/22/2024   12:40 PM 08/11/2023   10:26 AM 02/10/2023   10:30 AM  CMP  Glucose 70 - 99 mg/dL 870  875  861   BUN 8 - 23 mg/dL 12  12  16   Creatinine 0.44 - 1.00 mg/dL 8.98  8.97  8.98   Sodium 135 - 145 mmol/L 141  141  145    Potassium 3.5 - 5.1 mmol/L 4.6  5.1  4.7   Chloride 98 - 111 mmol/L 104  102  105   CO2 22 - 32 mmol/L 27  19  21    Calcium  8.9 - 10.3 mg/dL 9.9  9.1  9.3   Total Protein 6.5 - 8.1 g/dL 7.1   6.9   Total Bilirubin 0.0 - 1.2 mg/dL 0.5   0.6   Alkaline Phos 38 - 126 U/L 72   76   AST 15 - 41 U/L 37   22   ALT 0 - 44 U/L 28   23     Lipid Panel     Component Value Date/Time   CHOL 164 02/10/2023 1030   TRIG 107 02/10/2023 1030   HDL 55 02/10/2023 1030   CHOLHDL 3.2 02/19/2022 0654   VLDL 30 02/19/2022 0654   LDLCALC 90 02/10/2023 1030    CBC    Component Value Date/Time   WBC 7.1 03/22/2024 1340   RBC 4.94 03/22/2024 1340   HGB 12.9 03/22/2024 1340   HGB 13.9 01/01/2021 1647   HCT 41.3 03/22/2024 1340   HCT 42.2 01/01/2021 1647   PLT 364 03/22/2024 1340   PLT 331 01/01/2021 1647   MCV 83.6 03/22/2024 1340   MCV 84 01/01/2021 1647   MCH 26.1 03/22/2024 1340   MCHC 31.2 03/22/2024 1340   RDW 15.4 03/22/2024 1340   RDW 14.1 01/01/2021 1647   LYMPHSABS 2.5 03/22/2024 1340   LYMPHSABS 3.1 01/01/2021 1647   MONOABS 0.5 03/22/2024 1340   EOSABS 0.2 03/22/2024 1340   EOSABS 0.1 01/01/2021 1647   BASOSABS 0.0 03/22/2024 1340   BASOSABS 0.1 01/01/2021 1647    Lab Results  Component Value Date   HGBA1C 6.8 10/01/2024    Lab Results  Component Value Date   HGBA1C 6.8 10/01/2024   HGBA1C 6.9 08/11/2023   HGBA1C 6.8 02/10/2023       Assessment & Plan Type 2 diabetes mellitus Well-controlled with A1c of 6.8%. - Continue metformin  1000 mg oral twice daily with meals. - Continue to monitor blood glucose levels regularly. -Counseled on Diabetic diet, the healthy plate, 849 minutes of moderate intensity exercise/week Blood sugar logs with fasting goals of 80-120 mg/dl, random of less than 819 and in the event of sugars less than 60 mg/dl or greater than 599 mg/dl encouraged to notify the clinic. Advised on the need for annual eye exams, annual foot exams, Pneumonia  vaccine.   STEMI /Coronary artery disease and mitral valve regurgitation with dyspnea Managed medically due to age and surgical risk. Dyspnea at rest and with exertion, improved with furosemide . No chest pain reported. - Continue atorvastatin  and clopidogrel  as prescribed. - Use furosemide  20 mg as needed for fluid retention, monitor dyspnea improvement. - Avoid excessive exertion, park close to destinations. - Administer nitroglycerin  and seek emergency care if chest pain occurs.  Seizure disorder Managed with lacosamide , causing sedation. No recent seizures. Neurologist aware of dosing. - Continue lacosamide  at the reduced dose as discussed with the neurologist.   Dysarthria as late effect of stroke/vascular dementia Secondary risk factor modification - Continue statin -Continue Plavix  - Optimization of blood pressure and glycemic control is imperative   General Health Maintenance Eye exam overdue. - Schedule an eye exam as soon as possible.  Healthcare maintenance Declines lung cancer screening  No orders of the defined types were placed in this encounter.   Follow-up: Return in about 6 months (around 03/31/2025) for Chronic medical conditions.       Corrina Sabin, MD, FAAFP. [facility] [place], [place] [phone]   10/01/2024, 12:14 PM

## 2024-10-10 ENCOUNTER — Other Ambulatory Visit: Payer: Self-pay

## 2024-10-31 ENCOUNTER — Other Ambulatory Visit: Payer: Self-pay

## 2024-11-14 ENCOUNTER — Other Ambulatory Visit: Payer: Self-pay

## 2024-11-21 ENCOUNTER — Other Ambulatory Visit: Payer: Self-pay

## 2024-11-27 ENCOUNTER — Other Ambulatory Visit (HOSPITAL_COMMUNITY): Payer: Self-pay

## 2025-02-06 ENCOUNTER — Ambulatory Visit: Admitting: Neurology

## 2025-07-16 ENCOUNTER — Ambulatory Visit
